# Patient Record
Sex: Female | Born: 1950 | Race: Black or African American | Hispanic: No | Smoking: Never smoker
Health system: Southern US, Community
[De-identification: ages and names within clinical notes are randomized; demographics above are authoritative.]

## PROBLEM LIST (undated history)

## (undated) DIAGNOSIS — G629 Polyneuropathy, unspecified: Secondary | ICD-10-CM

## (undated) DIAGNOSIS — I10 Essential (primary) hypertension: Secondary | ICD-10-CM

## (undated) DIAGNOSIS — E119 Type 2 diabetes mellitus without complications: Secondary | ICD-10-CM

---

## 2016-05-04 ENCOUNTER — Emergency Department (HOSPITAL_BASED_OUTPATIENT_CLINIC_OR_DEPARTMENT_OTHER): Payer: No Typology Code available for payment source

## 2016-05-04 ENCOUNTER — Emergency Department (HOSPITAL_BASED_OUTPATIENT_CLINIC_OR_DEPARTMENT_OTHER)
Admission: EM | Admit: 2016-05-04 | Discharge: 2016-05-04 | Disposition: A | Discharge status: Home / Self Care | Payer: No Typology Code available for payment source | Attending: Emergency Medicine | Admitting: Emergency Medicine

## 2016-05-04 ENCOUNTER — Encounter (HOSPITAL_BASED_OUTPATIENT_CLINIC_OR_DEPARTMENT_OTHER): Payer: Self-pay

## 2016-05-04 DIAGNOSIS — I10 Essential (primary) hypertension: Secondary | ICD-10-CM | POA: Insufficient documentation

## 2016-05-04 DIAGNOSIS — E119 Type 2 diabetes mellitus without complications: Secondary | ICD-10-CM | POA: Diagnosis not present

## 2016-05-04 DIAGNOSIS — R350 Frequency of micturition: Secondary | ICD-10-CM | POA: Diagnosis not present

## 2016-05-04 DIAGNOSIS — E1165 Type 2 diabetes mellitus with hyperglycemia: Secondary | ICD-10-CM | POA: Diagnosis not present

## 2016-05-04 DIAGNOSIS — R109 Unspecified abdominal pain: Secondary | ICD-10-CM | POA: Diagnosis present

## 2016-05-04 DIAGNOSIS — R197 Diarrhea, unspecified: Secondary | ICD-10-CM | POA: Insufficient documentation

## 2016-05-04 DIAGNOSIS — Z79899 Other long term (current) drug therapy: Secondary | ICD-10-CM | POA: Diagnosis not present

## 2016-05-04 DIAGNOSIS — R739 Hyperglycemia, unspecified: Secondary | ICD-10-CM

## 2016-05-04 HISTORY — DX: Essential (primary) hypertension: I10

## 2016-05-04 HISTORY — DX: Polyneuropathy, unspecified: G62.9

## 2016-05-04 HISTORY — DX: Type 2 diabetes mellitus without complications: E11.9

## 2016-05-04 LAB — BASIC METABOLIC PANEL
Anion gap: 8 (ref 5–15)
BUN: 19 mg/dL (ref 6–20)
CHLORIDE: 101 mmol/L (ref 101–111)
CO2: 27 mmol/L (ref 22–32)
Calcium: 9.4 mg/dL (ref 8.9–10.3)
Creatinine, Ser: 0.8 mg/dL (ref 0.44–1.00)
GFR calc Af Amer: >60 mL/min (ref >60)
GFR calc non Af Amer: >60 mL/min (ref >60)
GLUCOSE: 264 mg/dL — AB (ref 65–99)
POTASSIUM: 3.8 mmol/L (ref 3.5–5.1)
Sodium: 136 mmol/L (ref 135–145)

## 2016-05-04 LAB — CBC WITH DIFFERENTIAL/PLATELET
Basophils Absolute: 0 10*3/uL (ref 0.0–0.1)
Basophils Relative: 0 %
Eosinophils Absolute: 0.2 10*3/uL (ref 0.0–0.7)
Eosinophils Relative: 3 %
HCT: 35.7 % — ABNORMAL LOW (ref 36.0–46.0)
Hemoglobin: 12.4 g/dL (ref 12.0–15.0)
LYMPHS ABS: 1.8 10*3/uL (ref 0.7–4.0)
LYMPHS PCT: 36 %
MCH: 30.5 pg (ref 26.0–34.0)
MCHC: 34.7 g/dL (ref 30.0–36.0)
MCV: 87.7 fL (ref 78.0–100.0)
MONO ABS: 0.4 10*3/uL (ref 0.1–1.0)
MONOS PCT: 9 %
Neutro Abs: 2.5 10*3/uL (ref 1.7–7.7)
Neutrophils Relative %: 52 %
PLATELETS: 218 10*3/uL (ref 150–400)
RBC: 4.07 MIL/uL (ref 3.87–5.11)
RDW: 12.6 % (ref 11.5–15.5)
WBC: 4.9 10*3/uL (ref 4.0–10.5)

## 2016-05-04 LAB — URINALYSIS, ROUTINE W REFLEX MICROSCOPIC
BILIRUBIN URINE: NEGATIVE
GLUCOSE, UA: NEGATIVE mg/dL
Hgb urine dipstick: NEGATIVE
Ketones, ur: NEGATIVE mg/dL
Leukocytes, UA: NEGATIVE
Nitrite: NEGATIVE
PROTEIN: NEGATIVE mg/dL
Specific Gravity, Urine: 1.003 — ABNORMAL LOW (ref 1.005–1.030)
pH: 7 (ref 5.0–8.0)

## 2016-05-04 MED ORDER — METOPROLOL TARTRATE 50 MG PO TABS
25.0000 mg | ORAL_TABLET | Freq: Once | ORAL | Status: AC
  Administered 2016-05-04: 25 mg via ORAL
  Filled 2016-05-04: qty 1

## 2016-05-04 MED ORDER — KETOROLAC TROMETHAMINE 30 MG/ML IJ SOLN
15.0000 mg | Freq: Once | INTRAMUSCULAR | Status: AC
  Administered 2016-05-04: 15 mg via INTRAVENOUS
  Filled 2016-05-04: qty 1

## 2016-05-04 MED ORDER — LISINOPRIL 10 MG PO TABS
10.0000 mg | ORAL_TABLET | Freq: Once | ORAL | Status: AC
Start: 2016-05-04 — End: 2016-05-04
  Administered 2016-05-04: 10 mg via ORAL
  Filled 2016-05-04: qty 1

## 2016-05-04 MED ORDER — SODIUM CHLORIDE 0.9 % IV BOLUS (SEPSIS)
1000.0000 mL | Freq: Once | INTRAVENOUS | Status: AC
  Administered 2016-05-04: 1000 mL via INTRAVENOUS

## 2016-05-04 MED ORDER — LISINOPRIL 10 MG PO TABS: 10.0000 mg | ORAL_TABLET | Freq: Every day | ORAL | 0 refills | Status: DC

## 2016-05-04 MED ORDER — METHOCARBAMOL 500 MG PO TABS
750.0000 mg | ORAL_TABLET | Freq: Once | ORAL | Status: AC
  Administered 2016-05-04: 750 mg via ORAL
  Filled 2016-05-04: qty 2

## 2016-05-04 MED ORDER — METHOCARBAMOL 500 MG PO TABS
500.0000 mg | ORAL_TABLET | Freq: Two times a day (BID) | ORAL | 0 refills | Status: DC
Start: 2016-05-04 — End: 2016-05-25

## 2016-05-04 MED ORDER — MORPHINE SULFATE (PF) 4 MG/ML IV SOLN
4.0000 mg | Freq: Once | INTRAVENOUS | Status: AC
  Administered 2016-05-04: 4 mg via INTRAVENOUS
  Filled 2016-05-04: qty 1

## 2016-05-04 MED ORDER — ONDANSETRON HCL 4 MG/2ML IJ SOLN
4.0000 mg | Freq: Once | INTRAMUSCULAR | Status: AC
  Administered 2016-05-04: 4 mg via INTRAVENOUS
  Filled 2016-05-04: qty 2

## 2016-05-04 MED ORDER — IOPAMIDOL (ISOVUE-300) INJECTION 61%
100.0000 mL | Freq: Once | INTRAVENOUS | Status: AC | PRN
  Administered 2016-05-04: 100 mL via INTRAVENOUS

## 2016-05-04 NOTE — ED Notes (Signed)
Will provide another urine sample when she can .

## 2016-05-04 NOTE — ED Provider Notes (Signed)
[facility] Note   CSN: [id number] Arrival date & time: 05/04/16  1354     History   Chief Complaint Chief Complaint  Patient presents with  . Diarrhea    HPI Paige Stewart is a 66 y.o. female.  HPI   Pt with hx HTN, DM p/w left flank pain that began 1 week ago, gradually worsening.  Pain is exacerbated by movement and diarrhea.  Has been having diarrhea 5 times daily for the past 3 days, nonbloody.  Also having urinary frequency, nausea.  Daughter also notes that for the past week she wobbles around while she is walking.  Denies fevers, dysuria, vaginal complaints.  Denies CP, SOB.    Past Medical History:  Diagnosis Date  . Diabetes mellitus without complication (HCC)   . Hypertension   . Neuropathy (HCC)     There are no active problems to display for this patient.   Past Surgical History:  Procedure Laterality Date  . CESAREAN SECTION      OB History    No data available       Home Medications    Prior to Admission medications   Medication Sig Start Date End Date Taking? Authorizing Provider  gabapentin (NEURONTIN) 300 MG capsule Take 300 mg by mouth 2 (two) times daily.   Yes Historical Provider, MD  metFORMIN (GLUCOPHAGE) 1000 MG tablet Take 1,000 mg by mouth 2 (two) times daily with a meal.   Yes Historical Provider, MD  metoprolol tartrate (LOPRESSOR) 25 MG tablet Take 25 mg by mouth 2 (two) times daily.   Yes Historical Provider, MD    Family History No family history on file.  Social History Social History  Substance Use Topics  . Smoking status: Never Smoker  . Smokeless tobacco: Never Used  . Alcohol use No     Allergies   Patient has no known allergies.   Review of Systems Review of Systems  All other systems reviewed and are negative.    Physical Exam Updated Vital Signs BP (!) 183/93   Pulse 82   Temp 98 F (36.7 C) (Oral)   Resp 20   Ht 4\' 11"  (1.499 m)   Wt 67.1 kg   SpO2 96%   BMI 29.89 kg/m    Physical Exam  Constitutional: She appears well-developed and well-nourished. No distress.  HENT:  Head: Normocephalic and atraumatic.  Neck: Neck supple.  Cardiovascular: Normal rate and regular rhythm.   Pulmonary/Chest: Effort normal and breath sounds normal. No respiratory distress. She has no wheezes. She has no rales.  Abdominal: Soft. She exhibits no distension. There is no tenderness. There is CVA tenderness (left ). There is no rebound and no guarding.  Left thoracic back/left lateral and posterior flank tenderness.  No skin changes.    Neurological: She is alert. She has normal strength. No cranial nerve deficit or sensory deficit. She exhibits normal muscle tone. GCS eye subscore is 4. GCS verbal subscore is 5. GCS motor subscore is 6.  Skin: She is not diaphoretic.  Nursing note and vitals reviewed.    ED Treatments / Results  Labs (all labs ordered are listed, but only abnormal results are displayed) Labs Reviewed  BASIC METABOLIC PANEL - Abnormal; Notable for the following:       Result Value   Glucose, Bld 264 (*)    All other components within normal limits  CBC WITH DIFFERENTIAL/PLATELET - Abnormal; Notable for the following:    HCT 35.7 (*)  All other components within normal limits  URINALYSIS, ROUTINE W REFLEX MICROSCOPIC - Abnormal; Notable for the following:    Specific Gravity, Urine 1.003 (*)    All other components within normal limits    EKG  EKG Interpretation  Date/Time:  Thursday May 04 2016 14:39:05 EDT Ventricular Rate:  81 PR Interval:    QRS Duration: 80 QT Interval:  354 QTC Calculation: 411 R Axis:   3 Text Interpretation:  Sinus rhythm Left ventricular hypertrophy Nonspecific T abnormalities, lateral leads Baseline wander in lead(s) I III aVL V1 No old tracing to compare Confirmed by BELFI  MD, MELANIE ([id number]) on 05/04/2016 2:49:12 PM       Radiology No results found.  Procedures Procedures (including critical care  time)  Medications Ordered in ED Medications  iopamidol (ISOVUE-300) 61 % injection 100 mL (not administered)  sodium chloride 0.9 % bolus 1,000 mL (0 mLs Intravenous Stopped 05/04/16 1557)  ondansetron (ZOFRAN) injection 4 mg (4 mg Intravenous Given 05/04/16 1456)  ketorolac (TORADOL) 30 MG/ML injection 15 mg (15 mg Intravenous Given 05/04/16 1457)  metoprolol (LOPRESSOR) tablet 25 mg (25 mg Oral Given 05/04/16 1642)  morphine 4 MG/ML injection 4 mg (4 mg Intravenous Given 05/04/16 1645)     Initial Impression / Assessment and Plan / ED Course  I have reviewed the triage vital signs and the nursing notes.  Pertinent labs & imaging results that were available during my care of the patient were reviewed by me and considered in my medical decision making (see chart for details).     Afebrile nontoxic patient with left flank pain x 1 week, worse with movement, but also now with diarrhea x 3 days and urinary frequency.  Labs significant for hyperglycemia only.  UA does not appear infected.  CT abd/pelvis pending at change of shift.  r/o diverticulitis.  Pt also hypertensive, has only been taking her metoprolol once daily, home dose given.  Pain also treated with improvement of blood pressure.  Pt signed out to Paige Piliyler Mohr, PA-C, at change of shift pending CT, reassessment, and disposition.   Final Clinical Impressions(s) / ED Diagnoses   Final diagnoses:  Left flank pain  Diarrhea, unspecified type  Urinary frequency  Hyperglycemia    New Prescriptions New Prescriptions   No medications on file     MorrisonvilleEmily Avarey Yaeger, PA-C 05/04/16 1657    Rolan BuccoMelanie Belfi, MD 05/05/16 1512

## 2016-05-04 NOTE — ED Triage Notes (Signed)
Daughter reports pt with n/d, abd pain x 3 days-weakness, unsteady gait x 1 week-[place] from [place] Nov [place]-pt speaks minimal english-NAD

## 2016-05-04 NOTE — Discharge Instructions (Signed)
Read the information below.  You may return to the Emergency Department at any time for worsening condition or any new symptoms that concern you.  If you develop high fevers, worsening abdominal pain, uncontrolled vomiting, or are unable to tolerate fluids by mouth, return to the ER for a recheck.   °

## 2016-05-04 NOTE — ED Provider Notes (Signed)
  Physical Exam  BP (!) 201/102 (BP Location: Right Arm) Comment: RN Windell Mouldinguth informed of both BPs  Pulse 85   Temp 98 F (36.7 C) (Oral)   Resp 20   Ht 4\' 11"  (1.499 m)   Wt 67.1 kg   SpO2 96%   BMI 29.89 kg/m   Physical Exam  ED Course  Procedures  MDM 4:24 PM- Sign out from [facility], PA-C  Per previous provider MDM:  Afebrile nontoxic patient with left flank pain x 1 week, worse with movement, but also now with diarrhea x 3 days and urinary frequency.  Labs significant for hyperglycemia only.  UA does not appear infected.  CT abd/pelvis pending at change of shift.  r/o diverticulitis.  Pt also hypertensive, has only been taking her metoprolol once daily, home dose given.  Pain also treated with improvement of blood pressure.  Pt signed out to Audry Piliyler Judye Lorino, PA-C, at change of shift pending CT, reassessment, and disposition.  6:43 PM CT Abdomen/pelvis IMPRESSION: No acute intra-abdominal or intrapelvic abnormalities.  Small umbilical hernia containing fat.  Aortic atherosclerosis and coronary retro calcifications.  Small pericardial effusion.  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   Imaging unremarkable. Labs unremarkable. Pt symptoms likely viral in etiology vs possible muscle spasm in flank. Will Rx robaxin. At time of discharge, Patient is in no acute distress. Vital Signs are stable. Patient is able to ambulate. Patient able to tolerate PO.      Audry Piliyler Johnavon Mcclafferty, PA-C 05/04/16 1844    Laurence Spatesachel Morgan Little, MD 05/05/16 [phone]

## 2016-05-04 NOTE — ED Notes (Signed)
Patient given graham cracker and soda.  

## 2016-05-21 ENCOUNTER — Inpatient Hospital Stay (HOSPITAL_COMMUNITY)
Admission: EM | Admit: 2016-05-21 | Discharge: 2016-05-25 | DRG: 065 | Disposition: A | Discharge status: Rehabilitation Facility | Payer: No Typology Code available for payment source | Attending: Family Medicine | Admitting: Family Medicine

## 2016-05-21 ENCOUNTER — Emergency Department (HOSPITAL_COMMUNITY): Payer: No Typology Code available for payment source

## 2016-05-21 ENCOUNTER — Encounter (HOSPITAL_COMMUNITY): Payer: Self-pay | Admitting: Emergency Medicine

## 2016-05-21 ENCOUNTER — Inpatient Hospital Stay (HOSPITAL_COMMUNITY): Payer: No Typology Code available for payment source

## 2016-05-21 DIAGNOSIS — R531 Weakness: Secondary | ICD-10-CM

## 2016-05-21 DIAGNOSIS — Z7984 Long term (current) use of oral hypoglycemic drugs: Secondary | ICD-10-CM

## 2016-05-21 DIAGNOSIS — I248 Other forms of acute ischemic heart disease: Secondary | ICD-10-CM | POA: Diagnosis present

## 2016-05-21 DIAGNOSIS — Z79899 Other long term (current) drug therapy: Secondary | ICD-10-CM | POA: Diagnosis not present

## 2016-05-21 DIAGNOSIS — E663 Overweight: Secondary | ICD-10-CM | POA: Diagnosis present

## 2016-05-21 DIAGNOSIS — Z823 Family history of stroke: Secondary | ICD-10-CM | POA: Diagnosis not present

## 2016-05-21 DIAGNOSIS — I160 Hypertensive urgency: Secondary | ICD-10-CM | POA: Diagnosis present

## 2016-05-21 DIAGNOSIS — I6789 Other cerebrovascular disease: Secondary | ICD-10-CM | POA: Diagnosis not present

## 2016-05-21 DIAGNOSIS — K551 Chronic vascular disorders of intestine: Secondary | ICD-10-CM | POA: Diagnosis present

## 2016-05-21 DIAGNOSIS — B9620 Unspecified Escherichia coli [E. coli] as the cause of diseases classified elsewhere: Secondary | ICD-10-CM | POA: Diagnosis not present

## 2016-05-21 DIAGNOSIS — Z8673 Personal history of transient ischemic attack (TIA), and cerebral infarction without residual deficits: Secondary | ICD-10-CM

## 2016-05-21 DIAGNOSIS — E1140 Type 2 diabetes mellitus with diabetic neuropathy, unspecified: Secondary | ICD-10-CM | POA: Diagnosis present

## 2016-05-21 DIAGNOSIS — G8191 Hemiplegia, unspecified affecting right dominant side: Secondary | ICD-10-CM | POA: Diagnosis not present

## 2016-05-21 DIAGNOSIS — I313 Pericardial effusion (noninflammatory): Secondary | ICD-10-CM | POA: Diagnosis present

## 2016-05-21 DIAGNOSIS — R29810 Facial weakness: Secondary | ICD-10-CM | POA: Diagnosis present

## 2016-05-21 DIAGNOSIS — I10 Essential (primary) hypertension: Secondary | ICD-10-CM

## 2016-05-21 DIAGNOSIS — G8101 Flaccid hemiplegia affecting right dominant side: Secondary | ICD-10-CM | POA: Diagnosis present

## 2016-05-21 DIAGNOSIS — E876 Hypokalemia: Secondary | ICD-10-CM | POA: Diagnosis not present

## 2016-05-21 DIAGNOSIS — R4701 Aphasia: Secondary | ICD-10-CM | POA: Diagnosis present

## 2016-05-21 DIAGNOSIS — N390 Urinary tract infection, site not specified: Secondary | ICD-10-CM

## 2016-05-21 DIAGNOSIS — R609 Edema, unspecified: Secondary | ICD-10-CM | POA: Diagnosis not present

## 2016-05-21 DIAGNOSIS — R9431 Abnormal electrocardiogram [ECG] [EKG]: Secondary | ICD-10-CM

## 2016-05-21 DIAGNOSIS — R7989 Other specified abnormal findings of blood chemistry: Secondary | ICD-10-CM | POA: Diagnosis not present

## 2016-05-21 DIAGNOSIS — Z6829 Body mass index (BMI) 29.0-29.9, adult: Secondary | ICD-10-CM

## 2016-05-21 DIAGNOSIS — E119 Type 2 diabetes mellitus without complications: Secondary | ICD-10-CM | POA: Diagnosis not present

## 2016-05-21 DIAGNOSIS — Z9114 Patient's other noncompliance with medication regimen: Secondary | ICD-10-CM

## 2016-05-21 DIAGNOSIS — I639 Cerebral infarction, unspecified: Secondary | ICD-10-CM | POA: Diagnosis present

## 2016-05-21 DIAGNOSIS — N12 Tubulo-interstitial nephritis, not specified as acute or chronic: Secondary | ICD-10-CM | POA: Diagnosis not present

## 2016-05-21 DIAGNOSIS — I2510 Atherosclerotic heart disease of native coronary artery without angina pectoris: Secondary | ICD-10-CM | POA: Diagnosis present

## 2016-05-21 DIAGNOSIS — I701 Atherosclerosis of renal artery: Secondary | ICD-10-CM | POA: Diagnosis present

## 2016-05-21 DIAGNOSIS — E785 Hyperlipidemia, unspecified: Secondary | ICD-10-CM | POA: Diagnosis present

## 2016-05-21 DIAGNOSIS — R471 Dysarthria and anarthria: Secondary | ICD-10-CM | POA: Diagnosis present

## 2016-05-21 DIAGNOSIS — I2489 Other forms of acute ischemic heart disease: Secondary | ICD-10-CM

## 2016-05-21 DIAGNOSIS — I214 Non-ST elevation (NSTEMI) myocardial infarction: Secondary | ICD-10-CM | POA: Diagnosis not present

## 2016-05-21 DIAGNOSIS — I63412 Cerebral infarction due to embolism of left middle cerebral artery: Secondary | ICD-10-CM | POA: Diagnosis present

## 2016-05-21 DIAGNOSIS — I638 Other cerebral infarction: Secondary | ICD-10-CM | POA: Diagnosis not present

## 2016-05-21 DIAGNOSIS — G8100 Flaccid hemiplegia affecting unspecified side: Secondary | ICD-10-CM | POA: Diagnosis not present

## 2016-05-21 DIAGNOSIS — E1142 Type 2 diabetes mellitus with diabetic polyneuropathy: Secondary | ICD-10-CM | POA: Diagnosis not present

## 2016-05-21 DIAGNOSIS — E1165 Type 2 diabetes mellitus with hyperglycemia: Secondary | ICD-10-CM | POA: Diagnosis present

## 2016-05-21 DIAGNOSIS — R1310 Dysphagia, unspecified: Secondary | ICD-10-CM | POA: Diagnosis present

## 2016-05-21 LAB — COMPREHENSIVE METABOLIC PANEL
ALT: 17 U/L (ref 14–54)
AST: 21 U/L (ref 15–41)
Albumin: 3.9 g/dL (ref 3.5–5.0)
Alkaline Phosphatase: 51 U/L (ref 38–126)
Anion gap: 9 (ref 5–15)
BILIRUBIN TOTAL: 0.4 mg/dL (ref 0.3–1.2)
BUN: 14 mg/dL (ref 6–20)
CO2: 27 mmol/L (ref 22–32)
Calcium: 9.3 mg/dL (ref 8.9–10.3)
Chloride: 101 mmol/L (ref 101–111)
Creatinine, Ser: 0.76 mg/dL (ref 0.44–1.00)
GFR calc Af Amer: >60 mL/min (ref >60)
Glucose, Bld: 254 mg/dL — ABNORMAL HIGH (ref 65–99)
Potassium: 3.8 mmol/L (ref 3.5–5.1)
Sodium: 137 mmol/L (ref 135–145)
TOTAL PROTEIN: 7.2 g/dL (ref 6.5–8.1)

## 2016-05-21 LAB — CBC
HCT: 37.3 % (ref 36.0–46.0)
HEMOGLOBIN: 12.6 g/dL (ref 12.0–15.0)
MCH: 29.4 pg (ref 26.0–34.0)
MCHC: 33.8 g/dL (ref 30.0–36.0)
MCV: 86.9 fL (ref 78.0–100.0)
Platelets: 233 10*3/uL (ref 150–400)
RBC: 4.29 MIL/uL (ref 3.87–5.11)
RDW: 13.2 % (ref 11.5–15.5)
WBC: 5.1 10*3/uL (ref 4.0–10.5)

## 2016-05-21 LAB — DIFFERENTIAL
BASOS ABS: 0 10*3/uL (ref 0.0–0.1)
Basophils Relative: 0 %
EOS ABS: 0.1 10*3/uL (ref 0.0–0.7)
Eosinophils Relative: 2 %
LYMPHS ABS: 1.2 10*3/uL (ref 0.7–4.0)
Lymphocytes Relative: 23 %
MONO ABS: 0.3 10*3/uL (ref 0.1–1.0)
MONOS PCT: 5 %
Neutro Abs: 3.6 10*3/uL (ref 1.7–7.7)
Neutrophils Relative %: 70 %

## 2016-05-21 LAB — PROTIME-INR
INR: 1.06
Prothrombin Time: 13.8 seconds (ref 11.4–15.2)

## 2016-05-21 LAB — I-STAT CHEM 8, ED
BUN: 17 mg/dL (ref 6–20)
CALCIUM ION: 1.18 mmol/L (ref 1.15–1.40)
CREATININE: 0.7 mg/dL (ref 0.44–1.00)
Chloride: 101 mmol/L (ref 101–111)
GLUCOSE: 256 mg/dL — AB (ref 65–99)
HCT: 39 % (ref 36.0–46.0)
HEMOGLOBIN: 13.3 g/dL (ref 12.0–15.0)
POTASSIUM: 3.8 mmol/L (ref 3.5–5.1)
Sodium: 138 mmol/L (ref 135–145)
TCO2: 31 mmol/L (ref 0–100)

## 2016-05-21 LAB — TROPONIN I
TROPONIN I: 3.4 ng/mL — AB (ref <0.03)
TROPONIN I: 3.5 ng/mL — AB (ref <0.03)

## 2016-05-21 LAB — APTT: APTT: 30 s (ref 24–36)

## 2016-05-21 LAB — GLUCOSE, CAPILLARY: Glucose-Capillary: 186 mg/dL — ABNORMAL HIGH (ref 65–99)

## 2016-05-21 LAB — ETHANOL: Alcohol, Ethyl (B): <5 mg/dL (ref <5)

## 2016-05-21 LAB — I-STAT TROPONIN, ED: TROPONIN I, POC: 1.91 ng/mL — AB (ref 0.00–0.08)

## 2016-05-21 MED ORDER — ACETAMINOPHEN 650 MG RE SUPP
650.0000 mg | RECTAL | Status: DC | PRN
  Filled 2016-05-21: qty 1

## 2016-05-21 MED ORDER — STROKE: EARLY STAGES OF RECOVERY BOOK
Freq: Once | Status: AC
  Administered 2016-05-21: 13:00:00
  Filled 2016-05-21: qty 1

## 2016-05-21 MED ORDER — METOPROLOL TARTRATE 5 MG/5ML IV SOLN
2.5000 mg | Freq: Once | INTRAVENOUS | Status: AC
  Administered 2016-05-21: 2.5 mg via INTRAVENOUS
  Filled 2016-05-21: qty 5

## 2016-05-21 MED ORDER — SENNOSIDES-DOCUSATE SODIUM 8.6-50 MG PO TABS
1.0000 | ORAL_TABLET | Freq: Every evening | ORAL | Status: DC | PRN
  Administered 2016-05-24: 1 via ORAL
  Filled 2016-05-21: qty 1

## 2016-05-21 MED ORDER — IOPAMIDOL (ISOVUE-370) INJECTION 76%: 75.0000 mL | Freq: Once | INTRAVENOUS | Status: DC | PRN

## 2016-05-21 MED ORDER — SODIUM CHLORIDE 0.9 % IV SOLN
INTRAVENOUS | Status: DC
  Administered 2016-05-21 – 2016-05-23 (×5): via INTRAVENOUS
  Administered 2016-05-24: 1000 mL via INTRAVENOUS

## 2016-05-21 MED ORDER — INSULIN ASPART 100 UNIT/ML ~~LOC~~ SOLN
0.0000 [IU] | SUBCUTANEOUS | Status: DC
  Administered 2016-05-21 – 2016-05-22 (×2): 2 [IU] via SUBCUTANEOUS
  Administered 2016-05-22: 3 [IU] via SUBCUTANEOUS
  Administered 2016-05-22: 2 [IU] via SUBCUTANEOUS
  Administered 2016-05-22 – 2016-05-23 (×2): 3 [IU] via SUBCUTANEOUS
  Administered 2016-05-23: 2 [IU] via SUBCUTANEOUS
  Administered 2016-05-23: 3 [IU] via SUBCUTANEOUS

## 2016-05-21 MED ORDER — IOPAMIDOL (ISOVUE-370) INJECTION 76%
INTRAVENOUS | Status: AC
  Administered 2016-05-21: 11:00:00
  Filled 2016-05-21: qty 200

## 2016-05-21 MED ORDER — ASPIRIN 300 MG RE SUPP
300.0000 mg | Freq: Once | RECTAL | Status: AC
  Administered 2016-05-21: 300 mg via RECTAL
  Filled 2016-05-21: qty 1

## 2016-05-21 MED ORDER — METOPROLOL TARTRATE 12.5 MG HALF TABLET: 12.5000 mg | ORAL_TABLET | Freq: Two times a day (BID) | ORAL | Status: DC

## 2016-05-21 MED ORDER — ACETAMINOPHEN 160 MG/5ML PO SOLN: 650.0000 mg | ORAL | Status: DC | PRN

## 2016-05-21 MED ORDER — ACETAMINOPHEN 325 MG PO TABS
650.0000 mg | ORAL_TABLET | ORAL | Status: DC | PRN
  Administered 2016-05-23: 650 mg via ORAL
  Filled 2016-05-21: qty 2

## 2016-05-21 NOTE — Progress Notes (Signed)
Patient was unable to cough on command during a swallow eval. SLP ordered per protocol.

## 2016-05-21 NOTE — ED Notes (Signed)
Notified by lab that troponin is 1.9.  MD notified.

## 2016-05-21 NOTE — ED Notes (Signed)
Patient transported to CT 

## 2016-05-21 NOTE — Progress Notes (Signed)
Patient arrived to floor. A&O X4. Family at bedside. MD at patient's bedside. Will continue to monitor

## 2016-05-21 NOTE — ED Notes (Signed)
Pt needing to lay flat at this time.  Swallow screen on hold until patient able to sit up.

## 2016-05-21 NOTE — Consult Note (Signed)
Neurology Consultation Reason for Consult: Right sided weakness Referring Physician: Corlis Leak, C  CC: Right sided weakness  History is obtained from:patient  HPI: Paige Stewart is a 66 y.o. female was last known to be in her normal state of health last night prior to going to bed. This morning around 8:30 AM, she was found down by her family and brought into the emergency department. No code stroke was called because she was outside the 8 hour window, but on arrival it was recognized that she had exam findings concerning for large vessel occlusion and therefore she was taken for a stat CT perfusion. This did show an area of penumbra, however she does not have any clear lesion on CTA that would be amenable to intervention.   LKW: 3/31 prior to bed tpa given?: no, outside of window    ROS:  Unable to obtain due to altered mental status.   Past Medical History:  Diagnosis Date  . Diabetes mellitus without complication (HCC)   . Hypertension   . Neuropathy (HCC)      Family history: Unable to obtain due to aphasia   Social History:  reports that she has never smoked. She has never used smokeless tobacco. She reports that she does not drink alcohol or use drugs.   Exam: Current vital signs: BP (!) 195/118 (BP Location: Right Arm)   Pulse (!) 112   Resp (!) 21   Wt 67.1 kg (148 lb)   SpO2 96%   BMI 29.89 kg/m  Vital signs in last 24 hours: Pulse Rate:  [92-112] 112 (04/01 1022) Resp:  [18-21] 21 (04/01 1022) BP: (193-195)/(112-118) 195/118 (04/01 1022) SpO2:  [94 %-98 %] 96 % (04/01 1022) Weight:  [67.1 kg (148 lb)] 67.1 kg (148 lb) (04/01 0951)   Physical Exam  Constitutional: Appears well-developed and well-nourished.  Psych: Affect appropriate to situation Eyes: No scleral injection HENT: No OP obstrucion Head: Normocephalic.  Cardiovascular: Normal rate and regular rhythm.  Respiratory: Effort normal and breath sounds normal to anterior ascultation GI: Soft.   No distension. There is no tenderness.  Skin: WDI  Neuro: Mental Status: Patient is awake, alert, she is able to follow simple commands, but does not have any verbal output. Cranial Nerves: II: able to count in all 4 fields Pupils are equal, round, and reactive to light.   III,IV, VI: EOMI without ptosis or diploplia.  V: Facial sensation is decreased on the right VII: Facial movement with decreased movement on the right VIII: hearing is intact to voice X: Uvula elevates symmetrically XI: Shoulder shrug is symmetric. XII: tongue is midline without atrophy or fasciculations.  Motor: She has a significant right hemiparesis, 1/5 Sensory: Decreased throughout the right side Cerebellar: No clear ataxia  I have reviewed labs in epic and the results pertinent to this consultation are: CMP-unremarkable other than elevated glucose  I have reviewed the images obtained: CT head-no hemorrhage, CT perfusion-small area of penumbra in the anterior MCA distraction on the left, CT angiogram-no clear lesion amenable to intervention  Impression: 66 year old female with a wake up stroke. Unfortunately she is not a candidate for intra-arterial intervention or IV TPA. She will need to be admitted for secondary stroke prevention measures and physical therapy.  She also has an elevated troponin, unclear if this is related to the stroke or cardiac issues. Though I suspect her stroke will not be massive, there may be some risk of hemorrhagic conversion, if there is concern about progression of her neck  disease then though there would need to be a risk versus benefit decision, IV heparin could be considered. I would be okay with dual antiplatelet therapy if indicated from a cardiac perspective.  Recommendations: 1. HgbA1c, fasting lipid panel 2. MRI of the brain without contrast 3. Frequent neuro checks 4. Echocardiogram 5. Carotid dopplers are not needed given that she got a CT angiogram 6. Prophylactic  therapy-Antiplatelet med: Aspirin - dose  PO or  PR 7. Risk factor modification 8. Telemetry monitoring 9. PT consult, OT consult, Speech consult 10. please page stroke NP  Or  PA  Or MD  from 8am -4 pm  as this patient will be followed by the stroke team at this point.   You can look them up on [email or web address]      Ritta Slot, MD [facility] [phone]  If 7pm- 7am, please page neurology on call as listed in AMION.

## 2016-05-21 NOTE — H&P (Signed)
Family Medicine Teaching [facility] Admission History and Physical Service Pager: [phone]  Patient name: Paige Stewart Medical record number: [id number] Date of birth: [birth year] Age: 66 y.o. Gender: female  Primary Care Provider: No PCP Per Patient Consultants: Cardiology, Neurology  Code Status: Partial. Patient does not want to be intubated.   Chief Complaint: right sided weakness, right facial droop, and dysarthria.   Assessment and Plan: Meliss Fleek is a 65 y.o. female presenting with right sided weakness, right facial droop, dysarthria. PMH is significant for DM2 ,HTN, diabetic neuropathy.   Right Sided Weakness, right facial droop, dysarthria: CT head without hemorrhage, CT perfusion with small area of penumbra in the anterior MCA per neurology. CTA chest, abd, pelvis without dissection. Risk factors: HTN, DM2 (uncertain how controlled this is). - admit to telemetry, attending Dr. Jennette Kettle - neurology consulted and following, appreciate recommendations: ASA  PO or  PR - ASA  PR  - neuro checks and vitals per protocol - risk stratification labs: A1c and Lipid panel - MRI brain - PT/OT/Speech consult   Elevated Troponin: istat troponin 1.91. No chest pain or shortness of breath. EKG with T wave inversion in 1, aVL and V2 with mild ST elevation in V2. No prior history of MI. CT chest/abd/pelvis noted three vessel coronary atherosclerosis.  Risk factors: HTN, DM2 (uncertain how controlled this is). - cardiology consulted, appreciate recommendations: could be seen in the setting of acute stroke as patient had not preceding anginal symptoms. EKG T wave changes could be seen also in stroke. No IV heparin dur to risk of hemorrhagic transformation, ASA, continue to cycle troponin, no invasive work up at this time, Statin with PO okay, Metoprolol if tolerated  - ASA (rectal today due to NPO) - ECHO - currently holding Statin and Metoprolol due to NPO  - trending  troponin  HTN: Elevated likely in the setting of stroke. However she did have a ED visit in March 2018 (for flank pain and diarrhea) where her BPs were elevated to 180s-190s/90s-100s; unclear what baseline BP is. Home medications: lisinopril and metoprolol.  - vitals per protocol - permissive HTN for the first 24 hours  - holding home BP meds for now  - may add PRN Hydralazine, will monitor first   DM2 with diabetic neuropathy: No a1c in chart. On Metformin at home. Patient not taking Gabapentin at home.  - holding metformin - A1c - CBGs q 4 with sensitive SSI until pt can have a diet  Small to Moderate Pericardial Effusion: noted on CT mildly increased since 04/2016.  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    FEN/GI: NPO until swallow study  Prophylaxis: SCDs for now. Will re-consider in 24 hours   Disposition: admit to FPTS telemetry for stroke and elevated troponin work up  History of Present Illness:  Paige Stewart is a 66 y.o. female presenting with right sided weakness, right facial droop, and expressive aphasia.   Patient able to understand english and answer yes or no questions. Daughter helped provide history.  Patient's last normal was around 9:30PM 3/31. This morning around 8:15AM daughter found patient on the floor. Per daughter the mother reported that she was walking back to the bed from her bathroom when she felt weak. She slid down to the floor and did not have loss of consciousness. She denies having chest pain or palpitations at that time. Reports she "slid"  down on her right side but notes of some discomfort in her left thigh. Daughter also reports that her brother reported that patient was texting him around 4:58 AM this morning.   No history of smoking or alcohol use. She denies abdominal pain, dysuria, increased frequency, reports normal bowel movements.  Daughter reports of intermittent dizziness "for years"; patient denies feeling dizzy today. Daughter also reports of generalized weakness "for years". No blurred vision, no nausea/vomiting. No fevers or chills.   Of note, patient is visit her daughter from [place]. At baseline, she lives by herself and is able to do all ADLs. Ambulates without any assistance. Reports of family history of strokes: sister at age 4 and another sister at age 10.   Patient arrived via EMS. In the ED, code stroke was called. CT head was negative. CT perfusion study small area of penumbra in the anterior MCA per neurology. CT angio chest/abd/pelvis negative for dissection, but showed mild to mod pericardial effusion mildly increased since 05/04/16. Patient was not a candidate for intra-arterial intervention or IV TPA. Labs also notable for istat troponin of 1.91. EKG with T wave inversion in 1, aVL and V2 with mild ST elevation in V2. CBC and CMP unremarkable except for glucose 254. PT/INR normal. ED also ordered UDS, UA, and EtOH.   Review Of Systems: Per HPI.  ROS  Patient Active Problem List   Diagnosis Date Noted  . Stroke [facility]) 05/21/2016    Past Medical History: Past Medical History:  Diagnosis Date  . Diabetes mellitus without complication (HCC)   . Hypertension   . Neuropathy [facility])     Past Surgical History: Past Surgical History:  Procedure Laterality Date  . CESAREAN SECTION      Social History: Social History  Substance Use Topics  . Smoking status: Never Smoker  . Smokeless tobacco: Never Used  . Alcohol use No   Additional social history: note above Please also refer to relevant sections of EMR.  Family History: Sister: stroke at age 10 Sister: stroke at age 30 No FH of MI   Allergies and Medications: No Known Allergies No current facility-administered medications on file prior to encounter.    Current Outpatient Prescriptions on File Prior to Encounter  Medication Sig Dispense  Refill  . gabapentin (NEURONTIN) 300 MG capsule Take 300 mg by mouth 2 (two) times daily.    Marland Kitchen lisinopril (PRINIVIL,ZESTRIL) 10 MG tablet Take 1 tablet (10 mg total) by mouth daily. 30 tablet 0  . metFORMIN (GLUCOPHAGE) 1000 MG tablet Take 1,000 mg by mouth 2 (two) times daily with a meal.    . metoprolol tartrate (LOPRESSOR) 25 MG tablet Take 25 mg by mouth 2 (two) times daily.    . methocarbamol (ROBAXIN) 500 MG tablet Take 1 tablet (500 mg total) by mouth 2 (two) times daily. (Patient not taking: Reported on 05/21/2016) 20 tablet 0    Objective: BP (!) 180/98   Pulse 91   Resp 17   Wt 67.1 kg (148 lb)   SpO2 100%   BMI 29.89 kg/m  Exam: GEN: NAD, resting  HEENT: Atraumatic, normocephalic, neck supple, EOMI, sclera clear, PERRL, pharynx  CV: RRR, no murmurs, rubs, or gallops PULM: CTAB, normal effort ABD: Soft, nontender, nondistended, NABS, no organomegaly SKIN: No rash or cyanosis; warm and well-perfused EXTR: No lower extremity edema or calf tenderness PSYCH: Mood and affect euthymic, normal rate and volume of speech NEURO: Awake, alert, able to answer yes or no questions but otherwise  has dysarthria. CN 2-4 intact. Reports of decreased sensation and decreased movement on the right side of face. CN 6 intact. CN 8-12 normal except right shoulder shrug is decreased. Right arm and leg weakness (1/5), sensation to light touch decreased on the right arm and leg compared to the left. Right arm and leg strength and sensation normal.    Labs and Imaging: CBC BMET   Recent Labs Lab 05/21/16 0953 05/21/16 1006  WBC 5.1  --   HGB 12.6 13.3  HCT 37.3 39.0  PLT 233  --     Recent Labs Lab 05/21/16 0953 05/21/16 1006  NA 137 138  K 3.8 3.8  CL 101 101  CO2 27  --   BUN 14 17  CREATININE 0.76 0.70  GLUCOSE 254* 256*  CALCIUM 9.3  --      CT Head/CTA Head and Neck/CT Perfusion: IMPRESSION: 23 mL volume of hypoperfusion in the left frontotemporal lobe and left frontal  operculum without fixed infarct by CT perfusion  Hypoperfusion of the anterior branch of left MCA supplying the above area of infarction. This appears to be a left M3 branch which may have a severe stenosis. Left M1 is widely patent.  No significant vertebral or carotid artery stenosis in the neck.  CTA chest/abd/pelvis:  IMPRESSION: 1. No acute aortic syndrome. 2. Small to moderate pericardial effusion, mildly increased since 05/04/2016. Consider echocardiographic correlation. 3. Trace dependent bilateral pleural effusions. No acute pulmonary disease. 4. No acute abnormality in the abdomen or pelvis. 5. Three-vessel coronary atherosclerosis. 6. 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 7. Small hiatal hernia.  Palma Holter, MD 05/21/2016, 12:23 PM PGY-2, [facility] FPTS Intern pager: [phone], text pages welcome

## 2016-05-21 NOTE — ED Notes (Signed)
Pt transported to CT with RN and Neurologist

## 2016-05-21 NOTE — Evaluation (Signed)
Clinical/Bedside Swallow Evaluation Patient Details  Name: Yazleemar Strassner MRN: [id number] Date of Birth: 1950/03/01  Today's Date: 05/21/2016 Time: SLP Start Time (ACUTE ONLY): 1645 SLP Stop Time (ACUTE ONLY): 1700 SLP Time Calculation (min) (ACUTE ONLY): 15 min  Past Medical History:  Past Medical History:  Diagnosis Date  . Diabetes mellitus without complication (HCC)   . Hypertension   . Neuropathy [facility])    Past Surgical History:  Past Surgical History:  Procedure Laterality Date  . CESAREAN SECTION     HPI:  Bernadean Saling a 66 y.o.femalepresenting with right sided weakness, right facial droop, dysarthria. PMH is significant for DM2 ,HTN, diabetic neuropathy. CT head without hemorrhage, CT perfusion with small area of penumbra in the anterior MCA per neurology. MRI 05/21/16 showed acute left basal ganglia infarct, moderate chronic small vessel ischemic disease. No prior swallowing evaluations in chart. Failed nursing stroke swallow screen.   Assessment / Plan / Recommendation Clinical Impression  Patient presents with severe risk for aspiration in setting of basal ganglia infarction. Right sided facial droop, weakness noted, reduced labial seal. Patient is alert, follows basic commands with 75% accuracy. She is unable to produce volitional cough despite cuing, requires max cues to initiate voicing which is breathy, weak. Manipulates ice chip when presented. Pt's daughter cued her to swallow ice chip whole, resulting in immediate cough and explosive expectoration, suggestive of reduced airway protection. Overt signs of aspiration not noted when pt allows ice to melt before swallowing. With initial swallows of thin liquid via cup and straw, patient does not display overt signs of aspiration. However, suspect delayed swallow initiation, discoordination. Initially tolerates pureed and regular solids with appearance of timely swallow initiation. Pt occasionally grimaces with swallow as  trials progressed. Presents with delayed coughing with thin liquid wash of regular solid. Immediate cough with all further consistencies trialed, including nectar-thick liquids, pureed solids. Recommend pt remain NPO, SLP will follow up for instrumental assessment (MBS) next date. Pt may have ice chips after oral care (allow to melt vs. swallow whole). Provided education to pt and daughter re: these recommendations. Pt has expressive language deficits, suspect some receptive as well. Will follow up for cognitive-linguistic evaluation.  SLP Visit Diagnosis: Dysphagia, unspecified (R13.10)    Aspiration Risk  Severe aspiration risk    Diet Recommendation NPO;Ice chips PRN after oral care   Medication Administration: Via alternative means    Other  Recommendations Oral Care Recommendations: Oral care QID Other Recommendations: Remove water pitcher;Have oral suction available   Follow up Recommendations Other (comment) (tbd)      Frequency and Duration min 2x/week  2 weeks       Prognosis Prognosis for Safe Diet Advancement: Good      Swallow Study   General Date of Onset: 05/21/16 HPI: Aubrianna Kopulandeis a 66 y.o.femalepresenting with right sided weakness, right facial droop, dysarthria. PMH is significant for DM2 ,HTN, diabetic neuropathy. CT head without hemorrhage, CT perfusion with small area of penumbra in the anterior MCA per neurology. MRI 05/21/16 showed acute left basal ganglia infarct, moderate chronic small vessel ischemic disease. No prior swallowing evaluations in chart. Failed nursing stroke swallow screen. Type of Study: Bedside Swallow Evaluation Previous Swallow Assessment: none in chart Diet Prior to this Study: NPO Temperature Spikes Noted: No Respiratory Status: Room air History of Recent Intubation: No Behavior/Cognition: Alert;Cooperative Oral Cavity Assessment: Within Functional Limits Oral Care Completed by SLP: No Oral Cavity - Dentition: Adequate natural  dentition Vision: Functional for self-feeding Self-Feeding Abilities:  Needs assist Patient Positioning: Upright in chair Baseline Vocal Quality: Breathy Volitional Cough: Other (Comment) (could not elicit) Volitional Swallow: Able to elicit    Oral/Motor/Sensory Function Overall Oral Motor/Sensory Function: Mild impairment Facial ROM: Reduced right;Suspected CN VII (facial) dysfunction Facial Symmetry: Abnormal symmetry right;Suspected CN VII (facial) dysfunction Facial Strength: Reduced right;Suspected CN VII (facial) dysfunction Facial Sensation: Within Functional Limits Lingual ROM: Within Functional Limits Lingual Symmetry: Within Functional Limits Lingual Strength: Within Functional Limits Lingual Sensation: Within Functional Limits Velum: Within Functional Limits Mandible: Within Functional Limits   Ice Chips Ice chips: Impaired Presentation: Spoon Pharyngeal Phase Impairments: Cough - Immediate   Thin Liquid Thin Liquid: Impaired Presentation: Cup;Straw Oral Phase Impairments: Reduced labial seal;Reduced lingual movement/coordination Oral Phase Functional Implications: Prolonged oral transit Pharyngeal  Phase Impairments: Suspected delayed Swallow;Multiple swallows;Cough - Immediate;Cough - Delayed    Nectar Thick Nectar Thick Liquid: Impaired Oral Phase Impairments: Reduced labial seal;Reduced lingual movement/coordination Oral phase functional implications: Right anterior spillage;Prolonged oral transit Pharyngeal Phase Impairments: Cough - Immediate;Cough - Delayed   Honey Thick Honey Thick Liquid: Not tested   Puree Puree: Impaired Pharyngeal Phase Impairments: Cough - Delayed   Solid   GO   Solid: Impaired Oral Phase Impairments: Impaired mastication Oral Phase Functional Implications: Impaired mastication;Prolonged oral transit;Oral residue Pharyngeal Phase Impairments: Cough - Delayed;Other (comments) (following liquid wash)       Rondel Baton, MS  CF-SLP Speech-Language Pathologist [phone] Arlana Lindau 05/21/2016,5:52 PM

## 2016-05-21 NOTE — Consult Note (Signed)
Cardiology Consultation:   Patient ID: Paige Stewart; [id number]; Paige Stewart   Admit date: 05/21/2016 Date of Consult: 05/21/2016  Reason for Consultation: Evaluation of elevated troponin in the setting of stroke and abnormal EKG Referring Provider:  Dr. Corlis Leak  Primary Care Provider: No PCP Per Patient Primary Cardiologist: None   Patient Profile:   Paige Stewart is a 66 y.o. female who woke up after usual state of health last night, found down, stroke, no hemorrhage found to have elevated troponin and abnormal EKG  History of Present Illness:   Paige Stewart 66 year old female with diabetes, hypertension who according to family was in her usual state of health last night but unfortunately was found down earlier this morning and was brought in the emergency department and found to have stroke in the anterior MCA distribution on left. She was not felt to be a TPA candidate. Nonhemorrhagic at this point. Aspirin administered.   She is unable to provide any verbal input but is able to follow simple commands. One of her family members is present and she states that she was on the phone with her brother in [place] at 5 AM this morning and was doing well. It seems like she was getting up to go to the bathroom when she fell and had her stroke. Decreased movement on the right facial symmetry noted. Right-sided weakness.  When asked if she had any chest pain, the patient is able to shake her head and respond no. She also does not have any shortness of breath currently, laying flat. Prior to this incident, she was not complaining of any chest pain at home according to family member.  Her family member does state that she has been noncompliant with her medications for hypertension and diabetes.  Blood pressure was significantly elevated on arrival at 193/112 with pulse of 92. A CT of her head and neck and CT perfusion scan was performed. There was concerned about the possibility of aortic  dissection with her hypertension and stroke.  Her point-of-care troponin was 1.91. Glucose elevated at 256. Hemoglobin 13.3.  Her ECG shows approximate 1 mm of ST elevation in V2, subtle in V3 with T-wave inversions noted in 1, aVL as well as biphasic T-wave in V2. When compared to prior EKG from 05/04/16, ST segment and accentuated T-wave changes are new.      Past Medical History:  Diagnosis Date  . Diabetes mellitus without complication (HCC)   . Hypertension   . Neuropathy [facility])     Past Surgical History:  Procedure Laterality Date  . CESAREAN SECTION       Inpatient Medications: Scheduled Meds:  Continuous Infusions:  PRN Meds: iopamidol  Allergies:   No Known Allergies  Social History:   Social History   Social History  . Marital status: Widowed    Spouse name: N/A  . Number of children: N/A  . Years of education: N/A   Social History Main Topics  . Smoking status: Never Smoker  . Smokeless tobacco: Never Used  . Alcohol use No  . Drug use: No  . Sexual activity: Not Asked   Other Topics Concern  . None   Social History Narrative  . None    Family History:   No family history on file.   ROS:  Please see the history of present illness. Unable to provide history secondary to nonverbal. Family does not report anything remarkable previously. All other ROS reviewed and negative.     Physical Exam/Data:   Vitals:  05/21/16 0945 05/21/16 0946 05/21/16 0951 05/21/16 1022  BP: (!) 193/112   (!) 195/118  Pulse: 92   (!) 112  Resp: 18   (!) 21  SpO2: 94% 98%  96%  Weight:   148 lb (67.1 kg)    No intake or output data in the 24 hours ending 05/21/16 1049 Filed Weights   05/21/16 0951  Weight: 148 lb (67.1 kg)    General:  Well nourished, well developed,Currently trouble moving the right side of her body. HEENT: normal, Towel wrapped around head Lymph: no adenopathy Neck: no JVD Endocrine:  No thryomegaly Vascular: No carotid bruits    Cardiac:  normal S1, S2; RRR; no murmur no gallops Lungs:  clear to auscultation bilaterally, no wheezing, rhonchi or rales  Abd: soft, nontender, no hepatomegaly  Ext: no edema Musculoskeletal:  No deformities Skin: warm and dry  Neuro:  Right facial weakness noted, right-sided body weakness noted Psych:  Unable to fully evaluate   EKG:  EKGs reviewed in history of present illness as described above personally viewed.  Relevant CV Studies: ECGs as above.  Laboratory Data:  Chemistry Recent Labs Lab 05/21/16 0953 05/21/16 1006  NA 137 138  K 3.8 3.8  CL 101 101  CO2 27  --   GLUCOSE 254* 256*  BUN 14 17  CREATININE 0.76 0.70  CALCIUM 9.3  --   GFRNONAA >60  --   GFRAA >60  --   ANIONGAP 9  --      Recent Labs Lab 05/21/16 0953  PROT 7.2  ALBUMIN 3.9  AST 21  ALT 17  ALKPHOS 51  BILITOT 0.4   Hematology Recent Labs Lab 05/21/16 0953 05/21/16 1006  WBC 5.1  --   RBC 4.29  --   HGB 12.6 13.3  HCT 37.3 39.0  MCV 86.9  --   MCH 29.4  --   MCHC 33.8  --   RDW 13.2  --   PLT 233  --    Cardiac EnzymesNo results for input(s): TROPONINI in the last 168 hours.  Recent Labs Lab 05/21/16 1000  TROPIPOC 1.91*    BNPNo results for input(s): BNP, PROBNP in the last 168 hours.  DDimer No results for input(s): DDIMER in the last 168 hours.  Radiology/Studies:  Ct Head Code Stroke Wo Contrast`  Result Date: 05/21/2016 CLINICAL DATA:  Code stroke.  Right-sided weakness EXAM: CT HEAD WITHOUT CONTRAST TECHNIQUE: Contiguous axial images were obtained from the base of the skull through the vertex without intravenous contrast. COMPARISON:  None. FINDINGS: Brain: Generalized atrophy. Low-density throughout the cerebral white matter bilaterally appears chronic. Negative for acute infarct. Negative for hemorrhage or mass lesion. No shift of the midline structures. Vascular: Negative for hyperdense vessel Skull: Negative Sinuses/Orbits: Mild mucosal edema left maxillary  sinus. Normal orbit. Other: None ASPECTS ([place] Stroke Program Early CT Score) - Ganglionic level infarction (caudate, lentiform nuclei, internal capsule, insula, M1-M3 cortex): 7 - Supraganglionic infarction (M4-M6 cortex): 3 Total score (0-10 with 10 being normal): 10 IMPRESSION: 1. Negative for acute infarct 2. ASPECTS is 10 3. Atrophy and moderate chronic microvascular ischemic change in the white matter I reviewed the images with Dr. Amada Jupiter at 1015 hours 05/21/2016. Electronically Signed   By: Marlan Palau M.D.   On: 05/21/2016 10:30    Assessment and Plan:   66 year old female with acute stroke, right-sided hemiparesis, aphasia, with troponin of 1.91 and abnormal EKG with T-wave inversion in 1, aVL and V2 as well  as mild ST elevation in V2.  Elevated troponin/demand ischemia  - Elevated troponin can be seen in the setting of acute stroke and she has had no preceding anginal symptoms. Also, she is able to communicate nonverbally that she is not having any chest discomfort.  - Abnormal EKG, T wave changes especially can be seen also in stroke. Unfortunately, I would not utilize IV heparin for risk of hemorrhagic transformation (see neurology note as well as) disease. This may not be true ACS/plaque rupture. I agree that with hypertension and recent stroke, checking CT angiogram to ensure that there is no evidence of dissection with propagation up neck makes sense. However, clinically, she is not demonstrating any severe chest discomfort, tearing discomfort that would go hand-in-hand with aortic dissection with propagation up her neck. She appears quite comfortable. Discussed with ER as well as Dr Amada Jupiter.  - Aspirin for now.  - Check echocardiogram.  - Continue to cycle cardiac troponin.  - No invasive workup at this time.  - Statin if tolerated from by mouth perspective.  - Metoprolol if tolerated  Acute stroke  - Per neurology, primary team  - Blood pressure per neurology  guidance  Uncontrolled hypertension  - Impart reactionary from acute stroke  - Management per primary team.   Signed, Donato Schultz, MD  05/21/2016 10:49 AM

## 2016-05-21 NOTE — ED Provider Notes (Signed)
[facility]-EMERGENCY DEPT Provider Note   CSN: [id number] Arrival date & time: 05/21/16  0935     History   Chief Complaint Chief Complaint  Patient presents with  . Facial Droop  . Extremity Weakness  . Aphasia    HPI Paige Stewart is a 66 y.o. female.  HPI   Patient is 66 year old female past medical history significant for diabetes and hypertension with wake-up stroke. Patient last talked to family around 5 PM last night. Patient found unable to speak not moving right side this morning.  Patient arrived in the emergency Department immediately assessed by EDP. Patient noted to have right facial weakness and right arm and leg weakness. Patient unable to perform words correctly. Patient otherwise stable.  Patient unable to explain her symptoms at this time.  Past Medical History:  Diagnosis Date  . Diabetes mellitus without complication (HCC)   . Hypertension   . Neuropathy [facility])     Patient Active Problem List   Diagnosis Date Noted  . Stroke [facility]) 05/21/2016    Past Surgical History:  Procedure Laterality Date  . CESAREAN SECTION      OB History    No data available       Home Medications    Prior to Admission medications   Medication Sig Start Date End Date Taking? Authorizing Provider  gabapentin (NEURONTIN) 300 MG capsule Take 300 mg by mouth 2 (two) times daily.   Yes Historical Provider, MD  ibuprofen (ADVIL,MOTRIN) 200 MG tablet Take 200 mg by mouth every 6 (six) hours as needed for moderate pain.   Yes Historical Provider, MD  lisinopril (PRINIVIL,ZESTRIL) 10 MG tablet Take 1 tablet (10 mg total) by mouth daily. 05/04/16  Yes Audry Pili, PA-C  metFORMIN (GLUCOPHAGE) 1000 MG tablet Take 1,000 mg by mouth 2 (two) times daily with a meal.   Yes Historical Provider, MD  metoprolol tartrate (LOPRESSOR) 25 MG tablet Take 25 mg by mouth 2 (two) times daily.   Yes Historical Provider, MD  Multiple Vitamin (MULTIVITAMIN) tablet Take 1 tablet by mouth daily.   Yes  Historical Provider, MD  methocarbamol (ROBAXIN) 500 MG tablet Take 1 tablet (500 mg total) by mouth 2 (two) times daily. Patient not taking: Reported on 05/21/2016 05/04/16   Audry Pili, PA-C    Family History No family history on file.  Social History Social History  Substance Use Topics  . Smoking status: Never Smoker  . Smokeless tobacco: Never Used  . Alcohol use No     Allergies   Patient has no known allergies.   Review of Systems Review of Systems  Unable to perform ROS: Mental status change     Physical Exam Updated Vital Signs BP (!) 180/98   Pulse 91   Resp 17   Wt 148 lb (67.1 kg)   SpO2 100%   BMI 29.89 kg/m   Physical Exam  Constitutional: She appears well-developed and well-nourished.  HENT:  Head: Normocephalic and atraumatic.  Eyes: Right eye exhibits no discharge.  Cardiovascular: Normal rate.   Pulmonary/Chest: Effort normal.  Neurological:  Right facial droop. Unable to hold up her arm or leg against gravity. Patient unclear's words.  Skin: Skin is warm and dry. She is not diaphoretic.  Psychiatric: She has a normal mood and affect.  Nursing note and vitals reviewed.    ED Treatments / Results  Labs (all labs ordered are listed, but only abnormal results are displayed) Labs Reviewed  COMPREHENSIVE METABOLIC PANEL - Abnormal; Notable for  the following:       Result Value   Glucose, Bld 254 (*)    All other components within normal limits  I-STAT CHEM 8, ED - Abnormal; Notable for the following:    Glucose, Bld 256 (*)    All other components within normal limits  I-STAT TROPOININ, ED - Abnormal; Notable for the following:    Troponin i, poc 1.91 (*)    All other components within normal limits  ETHANOL  PROTIME-INR  APTT  CBC  DIFFERENTIAL  RAPID URINE DRUG SCREEN, HOSP PERFORMED  URINALYSIS, ROUTINE W REFLEX MICROSCOPIC  TROPONIN I  TROPONIN I  TROPONIN I    EKG  EKG Interpretation None       Radiology Ct Angio  Head W Or Wo Contrast  Result Date: 05/21/2016 CLINICAL DATA:  Right facial and arm and leg weakness. EXAM: CT ANGIOGRAPHY HEAD AND NECK CT PERFUSION BRAIN TECHNIQUE: Multidetector CT imaging of the head and neck was performed using the standard protocol during bolus administration of intravenous contrast. Multiplanar CT image reconstructions and MIPs were obtained to evaluate the vascular anatomy. Carotid stenosis measurements (when applicable) are obtained utilizing NASCET criteria, using the distal internal carotid diameter as the denominator. Multiphase CT imaging of the brain was performed following IV bolus contrast injection. Subsequent parametric perfusion maps were calculated using RAPID software. CONTRAST:  100 mL Isovue 370 IV COMPARISON:  CT head 05/21/2016 FINDINGS: CTA NECK FINDINGS Aortic arch: Minimal atherosclerotic disease in the aortic arch. Proximal great vessels tortuous but widely patent. Right carotid system: Right common carotid artery widely patent. Right internal carotid artery widely patent. Severe stenosis at the origin of the right external carotid artery Left carotid system: Left common carotid artery widely patent with mild atherosclerotic disease. Mild atherosclerotic calcification of the left carotid bifurcation without significant stenosis. Vertebral arteries: Both vertebral arteries patent without significant stenosis. Skeleton: Scoliosis. No acute skeletal abnormality. Minimal degenerative change in the cervical spine. Other neck: Multiple small thyroid nodules. No mass or adenopathy in the neck. Upper chest: Lung apices clear. Review of the MIP images confirms the above findings CTA HEAD FINDINGS Anterior circulation: Mild atherosclerotic disease in the right cavernous carotid artery without significant stenosis. Right anterior cerebral artery widely patent. Right M1 widely patent. Right middle cerebral artery branches widely patent without stenosis. Mild atherosclerotic  calcification left cavernous carotid without stenosis. Left anterior cerebral artery widely patent. Left M1 segment is widely patent. Anterior branch of the left middle cerebral artery shows decreased density suggesting hypoperfusion. There appears to be a stenosis at the origin of this M3 branch of the left middle cerebral artery supplying the left frontotemporal lobe. Posterior circulation: Both vertebral arteries patent to the basilar. Basilar patent. PICA, superior cerebellar, posterior cerebral arteries patent without significant stenosis or occlusion. Venous sinuses: Patent Anatomic variants: None Delayed phase: Not performed Review of the MIP images confirms the above findings CT Brain Perfusion Findings: CBF (<30%) Volume: 0mL Perfusion (Tmax>6.0s) volume: 23mL Mismatch Volume: 23mL Infarction Location:Left frontotemporal lobe and left frontal operculum IMPRESSION: 23 mL volume of hypoperfusion in the left frontotemporal lobe and left frontal operculum without fixed infarct by CT perfusion Hypoperfusion of the anterior branch of left MCA supplying the above area of infarction. This appears to be a left M3 branch which may have a severe stenosis. Left M1 is widely patent. No significant vertebral or carotid artery stenosis in the neck. The images were reviewed with Dr. Amada Jupiter at 1030 hours on 05/21/2016 Electronically Signed  By: Marlan Palau M.D.   On: 05/21/2016 10:53   Ct Angio Neck W And/or Wo Contrast  Result Date: 05/21/2016 CLINICAL DATA:  Right facial and arm and leg weakness. EXAM: CT ANGIOGRAPHY HEAD AND NECK CT PERFUSION BRAIN TECHNIQUE: Multidetector CT imaging of the head and neck was performed using the standard protocol during bolus administration of intravenous contrast. Multiplanar CT image reconstructions and MIPs were obtained to evaluate the vascular anatomy. Carotid stenosis measurements (when applicable) are obtained utilizing NASCET criteria, using the distal internal carotid  diameter as the denominator. Multiphase CT imaging of the brain was performed following IV bolus contrast injection. Subsequent parametric perfusion maps were calculated using RAPID software. CONTRAST:  100 mL Isovue 370 IV COMPARISON:  CT head 05/21/2016 FINDINGS: CTA NECK FINDINGS Aortic arch: Minimal atherosclerotic disease in the aortic arch. Proximal great vessels tortuous but widely patent. Right carotid system: Right common carotid artery widely patent. Right internal carotid artery widely patent. Severe stenosis at the origin of the right external carotid artery Left carotid system: Left common carotid artery widely patent with mild atherosclerotic disease. Mild atherosclerotic calcification of the left carotid bifurcation without significant stenosis. Vertebral arteries: Both vertebral arteries patent without significant stenosis. Skeleton: Scoliosis. No acute skeletal abnormality. Minimal degenerative change in the cervical spine. Other neck: Multiple small thyroid nodules. No mass or adenopathy in the neck. Upper chest: Lung apices clear. Review of the MIP images confirms the above findings CTA HEAD FINDINGS Anterior circulation: Mild atherosclerotic disease in the right cavernous carotid artery without significant stenosis. Right anterior cerebral artery widely patent. Right M1 widely patent. Right middle cerebral artery branches widely patent without stenosis. Mild atherosclerotic calcification left cavernous carotid without stenosis. Left anterior cerebral artery widely patent. Left M1 segment is widely patent. Anterior branch of the left middle cerebral artery shows decreased density suggesting hypoperfusion. There appears to be a stenosis at the origin of this M3 branch of the left middle cerebral artery supplying the left frontotemporal lobe. Posterior circulation: Both vertebral arteries patent to the basilar. Basilar patent. PICA, superior cerebellar, posterior cerebral arteries patent without  significant stenosis or occlusion. Venous sinuses: Patent Anatomic variants: None Delayed phase: Not performed Review of the MIP images confirms the above findings CT Brain Perfusion Findings: CBF (<30%) Volume: 0mL Perfusion (Tmax>6.0s) volume: 23mL Mismatch Volume: 23mL Infarction Location:Left frontotemporal lobe and left frontal operculum IMPRESSION: 23 mL volume of hypoperfusion in the left frontotemporal lobe and left frontal operculum without fixed infarct by CT perfusion Hypoperfusion of the anterior branch of left MCA supplying the above area of infarction. This appears to be a left M3 branch which may have a severe stenosis. Left M1 is widely patent. No significant vertebral or carotid artery stenosis in the neck. The images were reviewed with Dr. Amada Jupiter at 1030 hours on 05/21/2016 Electronically Signed   By: Marlan Palau M.D.   On: 05/21/2016 10:53   Mr Brain Wo Contrast  Result Date: 05/21/2016 CLINICAL DATA:  Right-sided weakness. EXAM: MRI HEAD WITHOUT CONTRAST TECHNIQUE: Multiplanar, multiecho pulse sequences of the brain and surrounding structures were obtained without intravenous contrast. COMPARISON:  Head CT/CTA/ cerebral perfusion 05/21/2016 FINDINGS: Brain: There is an acute left lateral lenticulostriate territory infarct extending from the body of the left caudate nucleus into the posterior left lentiform nucleus and external capsule region. There is no evidence of associated hemorrhage. No mass, midline shift, or extra-axial fluid collection is seen. There is mild cerebral atrophy. Patchy T2 hyperintensities throughout the subcortical and  deep cerebral white matter bilaterally are nonspecific but compatible with moderate chronic small vessel ischemic disease. There is a chronic lacunar infarct in the left caudate. Vascular: Major intracranial vascular flow voids are preserved. Skull and upper cervical spine: Unremarkable bone marrow signal. Sinuses/Orbits: Unremarkable orbits.  No  significant sinus disease. Other: None. IMPRESSION: 1. Acute left basal ganglia infarct. 2. Moderate chronic small vessel ischemic disease. Electronically Signed   By: Sebastian Ache M.D.   On: 05/21/2016 14:20   Ct Cerebral Perfusion W Contrast  Result Date: 05/21/2016 CLINICAL DATA:  Right facial and arm and leg weakness. EXAM: CT ANGIOGRAPHY HEAD AND NECK CT PERFUSION BRAIN TECHNIQUE: Multidetector CT imaging of the head and neck was performed using the standard protocol during bolus administration of intravenous contrast. Multiplanar CT image reconstructions and MIPs were obtained to evaluate the vascular anatomy. Carotid stenosis measurements (when applicable) are obtained utilizing NASCET criteria, using the distal internal carotid diameter as the denominator. Multiphase CT imaging of the brain was performed following IV bolus contrast injection. Subsequent parametric perfusion maps were calculated using RAPID software. CONTRAST:  100 mL Isovue 370 IV COMPARISON:  CT head 05/21/2016 FINDINGS: CTA NECK FINDINGS Aortic arch: Minimal atherosclerotic disease in the aortic arch. Proximal great vessels tortuous but widely patent. Right carotid system: Right common carotid artery widely patent. Right internal carotid artery widely patent. Severe stenosis at the origin of the right external carotid artery Left carotid system: Left common carotid artery widely patent with mild atherosclerotic disease. Mild atherosclerotic calcification of the left carotid bifurcation without significant stenosis. Vertebral arteries: Both vertebral arteries patent without significant stenosis. Skeleton: Scoliosis. No acute skeletal abnormality. Minimal degenerative change in the cervical spine. Other neck: Multiple small thyroid nodules. No mass or adenopathy in the neck. Upper chest: Lung apices clear. Review of the MIP images confirms the above findings CTA HEAD FINDINGS Anterior circulation: Mild atherosclerotic disease in the right  cavernous carotid artery without significant stenosis. Right anterior cerebral artery widely patent. Right M1 widely patent. Right middle cerebral artery branches widely patent without stenosis. Mild atherosclerotic calcification left cavernous carotid without stenosis. Left anterior cerebral artery widely patent. Left M1 segment is widely patent. Anterior branch of the left middle cerebral artery shows decreased density suggesting hypoperfusion. There appears to be a stenosis at the origin of this M3 branch of the left middle cerebral artery supplying the left frontotemporal lobe. Posterior circulation: Both vertebral arteries patent to the basilar. Basilar patent. PICA, superior cerebellar, posterior cerebral arteries patent without significant stenosis or occlusion. Venous sinuses: Patent Anatomic variants: None Delayed phase: Not performed Review of the MIP images confirms the above findings CT Brain Perfusion Findings: CBF (<30%) Volume: 0mL Perfusion (Tmax>6.0s) volume: 23mL Mismatch Volume: 23mL Infarction Location:Left frontotemporal lobe and left frontal operculum IMPRESSION: 23 mL volume of hypoperfusion in the left frontotemporal lobe and left frontal operculum without fixed infarct by CT perfusion Hypoperfusion of the anterior branch of left MCA supplying the above area of infarction. This appears to be a left M3 branch which may have a severe stenosis. Left M1 is widely patent. No significant vertebral or carotid artery stenosis in the neck. The images were reviewed with Dr. Amada Jupiter at 1030 hours on 05/21/2016 Electronically Signed   By: Marlan Palau M.D.   On: 05/21/2016 10:53   Ct Angio Chest/abd/pel For Dissection W And/or W/wo  Result Date: 05/21/2016 CLINICAL DATA:  Hypertension. Found down this morning with right facial droop, aphasia and right sided weakness. EXAM: CT  ANGIOGRAPHY CHEST, ABDOMEN AND PELVIS TECHNIQUE: Multidetector CT imaging through the chest, abdomen and pelvis was  performed using the standard protocol during bolus administration of intravenous contrast. Multiplanar reconstructed images and MIPs were obtained and reviewed to evaluate the vascular anatomy. CONTRAST:  75 cc Isovue 370 IV. COMPARISON:  05/04/2016 CT abdomen/ pelvis. FINDINGS: CTA CHEST FINDINGS Cardiovascular: Top-normal heart size. Small to moderate pericardial effusion/thickening, mildly increased. Left anterior descending, left circumflex and right coronary atherosclerosis. Atherosclerotic nonaneurysmal thoracic aorta. No thoracic aortic intramural hematoma, dissection, pseudoaneurysm or penetrating atherosclerotic ulcer. Common origin of the brachiocephalic and left common carotid artery is from the aortic arch. Patent visualized proximal vertebral arteries. Atherosclerotic aortic arch branch vessels with no significant stenoses. Normal caliber pulmonary arteries. No central pulmonary emboli. Mediastinum/Nodes: Subcentimeter hypodense anterior right thyroid lobe nodule. Unremarkable esophagus. No pathologically enlarged axillary, mediastinal or hilar lymph nodes. Lungs/Pleura: No pneumothorax. Trace dependent bilateral pleural effusions. Hypoventilatory changes in the dependent lungs. No acute consolidative airspace disease, lung masses or significant pulmonary nodules in the aerated portions of the lungs. Musculoskeletal: No aggressive appearing focal osseous lesions. Mild thoracic spondylosis Review of the MIP images confirms the above findings. CTA ABDOMEN AND PELVIS FINDINGS VASCULAR Aorta: Atherosclerotic nonaneurysmal abdominal aorta. No evidence of dissection, vasculitis or significant stenosis. Celiac: Patent without evidence of aneurysm, dissection, vasculitis or significant stenosis. SMA: Patent without evidence of aneurysm, dissection, vasculitis or significant stenosis. Renals: Single renal arteries bilaterally. Approximately 50% proximal right renal artery stenosis due to the atherosclerotic  plaque. No significant left renal artery stenosis. IMA: Approximately 60% proximal IMA stenosis due to atherosclerotic plaque. Inflow: Patent without evidence of aneurysm, dissection, vasculitis or significant stenosis. Veins: No obvious venous abnormality within the limitations of this arterial phase study. Review of the MIP images confirms the above findings. NON-VASCULAR Hepatobiliary: Normal liver with no liver mass. Normal gallbladder with no radiopaque cholelithiasis. No biliary ductal dilatation. Pancreas: Normal, with no mass or duct dilation. Spleen: Normal size. No mass. Adrenals/Urinary Tract: Normal adrenals. Symmetric contrast nephrograms. No hydronephrosis. No renal mass. 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 Normal bladder. Stomach/Bowel: Small hiatal hernia. Otherwise collapsed and grossly normal stomach. Normal caliber small bowel with no small bowel wall thickening. Normal appendix. Normal large bowel with no diverticulosis, large bowel wall thickening or pericolonic fat stranding. Vascular/Lymphatic: Atherosclerotic nonaneurysmal abdominal aorta. No pathologically enlarged lymph nodes in the abdomen or pelvis. Reproductive: Grossly normal uterus. Contrast in the vagina is probably due to retrograde filling from urinary incontinence. No adnexal mass. Other: No pneumoperitoneum, ascites or focal fluid collection. Musculoskeletal: No aggressive appearing focal osseous lesions. Review of the MIP images confirms the above findings. IMPRESSION: 1. No acute aortic syndrome. 2. Small to moderate pericardial effusion, mildly increased since 05/04/2016. Consider echocardiographic correlation. 3. Trace dependent bilateral pleural effusions. No acute pulmonary disease. 4. No acute abnormality in the abdomen or pelvis. 5. Three-vessel coronary atherosclerosis. 6. 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 7. Small hiatal hernia. Electronically Signed   By: Delbert Phenix M.D.   On: 05/21/2016 11:20   Ct Head Code Stroke Wo Contrast`  Result Date: 05/21/2016 CLINICAL DATA:  Code stroke.  Right-sided weakness EXAM: CT HEAD WITHOUT CONTRAST TECHNIQUE: Contiguous axial images were obtained from the base of the skull through the vertex without intravenous contrast. COMPARISON:  None. FINDINGS: Brain: Generalized atrophy. Low-density throughout the cerebral white matter bilaterally appears chronic. Negative for acute infarct. Negative for hemorrhage or mass lesion. No shift of the midline structures. Vascular: Negative for hyperdense vessel Skull: Negative Sinuses/Orbits: Mild mucosal edema left maxillary sinus. Normal orbit. Other: None ASPECTS ([place] Stroke Program Early CT Score) - Ganglionic level infarction (caudate, lentiform nuclei, internal capsule, insula, M1-M3 cortex): 7 - Supraganglionic infarction (M4-M6 cortex): 3 Total score (0-10 with 10 being normal): 10 IMPRESSION: 1. Negative for acute infarct 2. ASPECTS is 10 3. Atrophy and moderate chronic microvascular ischemic change in the white matter I reviewed the images with Dr. Amada Jupiter at 1015 hours 05/21/2016. Electronically Signed   By: Marlan Palau M.D.   On: 05/21/2016 10:30    Procedures Procedures (including critical care time)  Medications Ordered in ED Medications   stroke: mapping our early stages of recovery book (not administered)  acetaminophen (TYLENOL) tablet 650 mg (not administered)    Or  acetaminophen (TYLENOL) solution 650 mg (not administered)    Or  acetaminophen (TYLENOL) suppository 650 mg (not administered)  senna-docusate (Senokot-S) tablet 1 tablet (not administered)  insulin aspart (novoLOG) injection 0-9 Units (not administered)  0.9 %  sodium chloride infusion (not administered)  aspirin suppository 300 mg (not administered)    iopamidol (ISOVUE-370) 76 % injection (  Contrast Given 05/21/16 1045)     Initial Impression / Assessment and Plan / ED Course  I have reviewed the triage vital signs and the nursing notes.  Pertinent labs & imaging results that were available during my care of the patient were reviewed by me and considered in my medical decision making (see chart for details).  Clinical Course as of May 22 1515  Sun May 21, 2016  0950 Pt evaluated. Last normal by family 5:30pm yesterday. Woke up with right hemiparesis and facial droop. Aphasic. CT head/neck, CTA perfusion order. Neuro consulted. Stat labs ordered.  [BS]    Clinical Course User Index [BS] Carolynn Comment, MD    Patient is a 66 year old female pregnant presenting with wake-up stroke. Patient has right facial facial droop as well as right arm and leg weakness. Patient qualifies potentially for IR involvement. Dr. Amada Jupiter paged upon arrival. Patient will get CT perfusion study, CT angiogram of head and neck.  3:17 PM Patient noted to have elevated troponin in the setting of new neurologic changes mild concern for dissection. However patient already received dye load, we'll do CT noncontrast of chest and abdomen to make sure patient does not have large dissecting aorta. We'll do this while waiting for perfusion study be read.  Perf shows stroke not ammenable to IR.  Discussed EKG with cards, discussed heparin vs no and BP management.  Will hold heparin, let BP ride given ischemic stroke. Will do serial trop.    CRITICAL CARE Performed by: Arlana Hove Total critical care time: 45 minutes Critical care time was exclusive of separately billable procedures and treating other patients. Critical care was necessary to treat or prevent imminent or life-threatening deterioration. Critical care was time spent personally by me on the following activities: development of treatment plan with patient and/or surrogate as well as nursing,  discussions with consultants, evaluation of patient's response to treatment, examination of patient, obtaining history from patient or surrogate, ordering and performing treatments and interventions, ordering and review of laboratory studies, ordering and review of radiographic studies, pulse oximetry and re-evaluation of patient's condition.    Final Clinical Impressions(s) / ED  Diagnoses   Final diagnoses:  Right sided weakness    New Prescriptions Current Discharge Medication List       Terius Jacuinde Randall An, MD 05/21/16 1517

## 2016-05-21 NOTE — Progress Notes (Signed)
Documentation:   Troponin I was 3.4 from 1.9 in the ED. Went to evaluate patient she is not having any chest pain or shortness of breath. Discussed with neurology first; reported if patient needs beta blocker then go ahead and give although IV metoprolol can lower BP more than PO (patient failed swallow study) and IV heparin is not a complete contraindication. Called Cards on call Dr. Aniceto Boss to discuss. Per Dr. Anne Fu' note this morning, would not do IV heparin. Dr. Daphine Deutscher suggested trying low dose IV Metoprolol 2.5mg  once and monitor pressure; if okay then can do q 6 hr even at  dose. She reported would probably hold off on IV heparin for now as it is possible this is demand ischemia in the setting of stroke and not plaque rupture as risk may outweigh the benefit. Will order Metoprolol 2.5mg  once and monitor. Continue trending enzyme.   Palma Holter, MD  PGY 2 Family Medicine

## 2016-05-21 NOTE — ED Notes (Signed)
Unsuccessful attempt at calling report.

## 2016-05-21 NOTE — Progress Notes (Signed)
Lab called to notify nurse of critical value troponin of 3.40.  MD is notified.  Also mentioned to MD that neuro floor is not appropriate for patient with a troponin that is trending up-

## 2016-05-21 NOTE — ED Notes (Signed)
On way to MRI 

## 2016-05-21 NOTE — ED Notes (Signed)
Unsuccessful attempt at giving report.  Patient still in MRI.

## 2016-05-21 NOTE — ED Triage Notes (Signed)
Pt in from home via [facility] with stroke-like symptoms. Per EMS, pt was LSN last night at 10pm. At 0830, pt was found supine on floor with R facial droop, R arm and leg weakness and aphasia. Pt arrives awake, able to slur name. BP 200/110 for EMS, CBG 280. 18Ga to [facility]

## 2016-05-22 ENCOUNTER — Inpatient Hospital Stay (HOSPITAL_COMMUNITY): Payer: No Typology Code available for payment source

## 2016-05-22 DIAGNOSIS — I10 Essential (primary) hypertension: Secondary | ICD-10-CM

## 2016-05-22 DIAGNOSIS — G8191 Hemiplegia, unspecified affecting right dominant side: Secondary | ICD-10-CM

## 2016-05-22 LAB — GLUCOSE, CAPILLARY
GLUCOSE-CAPILLARY: 171 mg/dL — AB (ref 65–99)
GLUCOSE-CAPILLARY: 174 mg/dL — AB (ref 65–99)
GLUCOSE-CAPILLARY: 181 mg/dL — AB (ref 65–99)
GLUCOSE-CAPILLARY: 214 mg/dL — AB (ref 65–99)
Glucose-Capillary: 198 mg/dL — ABNORMAL HIGH (ref 65–99)
Glucose-Capillary: 203 mg/dL — ABNORMAL HIGH (ref 65–99)

## 2016-05-22 LAB — CBC
HEMATOCRIT: 36.6 % (ref 36.0–46.0)
HEMOGLOBIN: 12.9 g/dL (ref 12.0–15.0)
MCH: 30.5 pg (ref 26.0–34.0)
MCHC: 35.2 g/dL (ref 30.0–36.0)
MCV: 86.5 fL (ref 78.0–100.0)
Platelets: 232 10*3/uL (ref 150–400)
RBC: 4.23 MIL/uL (ref 3.87–5.11)
RDW: 13.4 % (ref 11.5–15.5)
WBC: 4.7 10*3/uL (ref 4.0–10.5)

## 2016-05-22 LAB — URINALYSIS, ROUTINE W REFLEX MICROSCOPIC
BILIRUBIN URINE: NEGATIVE
Glucose, UA: NEGATIVE mg/dL
Ketones, ur: 5 mg/dL — AB
NITRITE: NEGATIVE
PH: 7 (ref 5.0–8.0)
Protein, ur: NEGATIVE mg/dL
SPECIFIC GRAVITY, URINE: 1.006 (ref 1.005–1.030)

## 2016-05-22 LAB — LIPID PANEL
CHOL/HDL RATIO: 4.9 ratio
Cholesterol: 168 mg/dL (ref 0–200)
HDL: 34 mg/dL — AB (ref >40)
LDL CALC: 118 mg/dL — AB (ref 0–99)
TRIGLYCERIDES: 81 mg/dL (ref <150)
VLDL: 16 mg/dL (ref 0–40)

## 2016-05-22 LAB — BASIC METABOLIC PANEL
ANION GAP: 8 (ref 5–15)
BUN: 11 mg/dL (ref 6–20)
CHLORIDE: 105 mmol/L (ref 101–111)
CO2: 26 mmol/L (ref 22–32)
Calcium: 9 mg/dL (ref 8.9–10.3)
Creatinine, Ser: 0.71 mg/dL (ref 0.44–1.00)
GFR calc non Af Amer: >60 mL/min (ref >60)
GLUCOSE: 183 mg/dL — AB (ref 65–99)
POTASSIUM: 3.5 mmol/L (ref 3.5–5.1)
Sodium: 139 mmol/L (ref 135–145)

## 2016-05-22 LAB — TROPONIN I
TROPONIN I: 3.94 ng/mL — AB (ref <0.03)
TROPONIN I: 4.13 ng/mL — AB (ref <0.03)
TROPONIN I: 4.12 ng/mL — AB (ref <0.03)
Troponin I: 4 ng/mL (ref <0.03)

## 2016-05-22 LAB — CK: Total CK: 105 U/L (ref 38–234)

## 2016-05-22 MED ORDER — ASPIRIN 300 MG RE SUPP
300.0000 mg | Freq: Every day | RECTAL | Status: DC
  Administered 2016-05-22 – 2016-05-23 (×2): 300 mg via RECTAL
  Filled 2016-05-22 (×2): qty 1

## 2016-05-22 MED ORDER — ATORVASTATIN CALCIUM 40 MG PO TABS
40.0000 mg | ORAL_TABLET | Freq: Every day | ORAL | Status: DC
  Administered 2016-05-22 – 2016-05-25 (×4): 40 mg via ORAL
  Filled 2016-05-22 (×4): qty 1

## 2016-05-22 MED ORDER — METOPROLOL TARTRATE 5 MG/5ML IV SOLN
2.5000 mg | Freq: Four times a day (QID) | INTRAVENOUS | Status: DC
  Administered 2016-05-22 – 2016-05-23 (×6): 2.5 mg via INTRAVENOUS
  Filled 2016-05-22 (×5): qty 5

## 2016-05-22 NOTE — Progress Notes (Signed)
OT Cancellation Note  Patient Details Name: Paige Stewart MRN: [id number] DOB: 08/15/1950   Cancelled Treatment:    Reason Eval/Treat Not Completed: Medical issues which prohibited therapy (Pt with bed rest order. Will evaluate when upgraded.)  Evern Bio 05/22/2016, 8:38 AM  [phone]

## 2016-05-22 NOTE — Progress Notes (Signed)
Modified Barium Swallow Progress Note  Patient Details  Name: Paige Stewart MRN: [id number] Date of Birth: 02/28/1950  Today's Date: 05/22/2016  Modified Barium Swallow completed.  Full report located under Chart Review in the Imaging Section.  Brief recommendations include the following:  Clinical Impression  Pt presents with a moderate oral, mild pharyngeal dysphagia marked by reduced sensorimotor function - this leads to poor bolus cohesion and propulsion, anterior loss right side, right oral residue.  Swallow onset is delayed, particularly with thin liquids, which reach the pyriforms and then are aspirated during the swallow (accompanied by cough).  A chin tuck effectively prevented aspiration, but pt required significant cues to help her to carry-out chin tuck. There was good pharyngeal clearance of all POs.  Esophageal sweep revealed barium retention in the distal esophagus which remained throughout the study.  For now, recommend initiating a dysphagia 1 diet with nectar thick liquids.  Outside of meals, pt may have sips of thin liquid if she has 1:1 cueing and assist to tuck her chin.  Meds may be given whole in puree.  SLP will follow for safety/diet progression and pending speech language evaluation.  Pt's dtr present for exam and results/recs were reviewed.    Swallow Evaluation Recommendations       SLP Diet Recommendations: Dysphagia 1 (Puree) solids;Nectar thick liquid   Liquid Administration via: Cup   Medication Administration: Whole meds with puree   Supervision: Staff to assist with self feeding   Compensations: Minimize environmental distractions;Slow rate;Small sips/bites;Lingual sweep for clearance of pocketing   Postural Changes: Remain semi-upright after after feeds/meals (Comment)   Oral Care Recommendations: Oral care BID   Other Recommendations: Remove water pitcher;Have oral suction available    [name] [name] 05/22/2016,2:38 PM

## 2016-05-22 NOTE — Progress Notes (Signed)
PT Cancellation Note  Patient Details Name: Paige Stewart MRN: [id number] DOB: 04/03/1950   Cancelled Treatment:    Reason Eval/Treat Not Completed: Medical issues which prohibited therapy, pt currently on bedrest and being evaluated due to mental status changes. Will follow when activity orders upgraded.    [name] [name] 05/22/2016, 11:16 AM  [phone]

## 2016-05-22 NOTE — Progress Notes (Addendum)
Progress Note  Patient Name: Paige Stewart Date of Encounter: 05/22/2016  Primary Cardiologist: Anne Fu  Subjective   Not having any CP (shakes head no). No SOB. Daughter with her. Tired  Inpatient Medications    Scheduled Meds: . aspirin  300 mg Rectal Daily  . insulin aspart  0-9 Units Subcutaneous Q4H  . metoprolol  2.5 mg Intravenous Q6H   Continuous Infusions: . sodium chloride 100 mL/hr at 05/22/16 0412   PRN Meds: acetaminophen **OR** acetaminophen (TYLENOL) oral liquid 160 mg/5 mL **OR** acetaminophen, senna-docusate   Vital Signs    Vitals:   05/22/16 0112 05/22/16 0239 05/22/16 0439 05/22/16 0548  BP: (!) 184/94 (!) 177/100 (!) 160/92   Pulse: 82 81 80   Resp: 12 (!) 21 16   Temp: 98.2 F (36.8 C)     TempSrc: Oral     SpO2: 100% 100% 100%   Weight:    146 lb 12.8 oz (66.6 kg)    Intake/Output Summary (Last 24 hours) at 05/22/16 0833 Last data filed at 05/22/16 0700  Gross per 24 hour  Intake          1136.24 ml  Output                0 ml  Net          1136.24 ml   Filed Weights   05/21/16 0951 05/22/16 0548  Weight: 148 lb (67.1 kg) 146 lb 12.8 oz (66.6 kg)    Telemetry    No adverse rhythms - Personally Reviewed  ECG    NSR with TWI noted - Personally Reviewed  Physical Exam   GEN: No acute distress.  Tired appearing Neck: No JVD Cardiac: RRR, no murmurs, rubs, or gallops.  Respiratory: Clear to auscultation bilaterally. GI: Soft, nontender, non-distended obese MS: No edema; No deformity. Neuro:  Right sided weakness, non verbal  Psych: Normal affect   Labs    Chemistry Recent Labs Lab 05/21/16 0953 05/21/16 1006 05/22/16 0006  NA 137 138 139  K 3.8 3.8 3.5  CL 101 101 105  CO2 27  --  26  GLUCOSE 254* 256* 183*  BUN CREATININE 0.76 0.70 0.71  CALCIUM 9.3  --  9.0  PROT 7.2  --   --   ALBUMIN 3.9  --   --   AST 21  --   --   ALT 17  --   --   ALKPHOS 51  --   --   BILITOT 0.4  --   --   GFRNONAA >60   --  >60  GFRAA >60  --  >60  ANIONGAP 9  --  8     Hematology Recent Labs Lab 05/21/16 0953 05/21/16 1006 05/22/16 0006  WBC 5.1  --  4.7  RBC 4.29  --  4.23  HGB 12.6 13.3 12.9  HCT 37.3 39.0 36.6  MCV 86.9  --  86.5  MCH 29.4  --  30.5  MCHC 33.8  --  35.2  RDW 13.2  --  13.4  PLT 233  --  232    Cardiac Enzymes Recent Labs Lab 05/21/16 1446 05/21/16 1804 05/22/16 0006 05/22/16 0604  TROPONINI 3.40* 3.50* 4.13* 3.94*    Recent Labs Lab 05/21/16 1000  TROPIPOC 1.91*     BNPNo results for input(s): BNP, PROBNP in the last 168 hours.   DDimer No results for input(s): DDIMER in the last 168 hours.  Radiology    Ct Angio Head W Or Wo Contrast  Result Date: 05/21/2016 CLINICAL DATA:  Right facial and arm and leg weakness. EXAM: CT ANGIOGRAPHY HEAD AND NECK CT PERFUSION BRAIN TECHNIQUE: Multidetector CT imaging of the head and neck was performed using the standard protocol during bolus administration of intravenous contrast. Multiplanar CT image reconstructions and MIPs were obtained to evaluate the vascular anatomy. Carotid stenosis measurements (when applicable) are obtained utilizing NASCET criteria, using the distal internal carotid diameter as the denominator. Multiphase CT imaging of the brain was performed following IV bolus contrast injection. Subsequent parametric perfusion maps were calculated using RAPID software. CONTRAST:  100 mL Isovue 370 IV COMPARISON:  CT head 05/21/2016 FINDINGS: CTA NECK FINDINGS Aortic arch: Minimal atherosclerotic disease in the aortic arch. Proximal great vessels tortuous but widely patent. Right carotid system: Right common carotid artery widely patent. Right internal carotid artery widely patent. Severe stenosis at the origin of the right external carotid artery Left carotid system: Left common carotid artery widely patent with mild atherosclerotic disease. Mild atherosclerotic calcification of the left carotid bifurcation without  significant stenosis. Vertebral arteries: Both vertebral arteries patent without significant stenosis. Skeleton: Scoliosis. No acute skeletal abnormality. Minimal degenerative change in the cervical spine. Other neck: Multiple small thyroid nodules. No mass or adenopathy in the neck. Upper chest: Lung apices clear. Review of the MIP images confirms the above findings CTA HEAD FINDINGS Anterior circulation: Mild atherosclerotic disease in the right cavernous carotid artery without significant stenosis. Right anterior cerebral artery widely patent. Right M1 widely patent. Right middle cerebral artery branches widely patent without stenosis. Mild atherosclerotic calcification left cavernous carotid without stenosis. Left anterior cerebral artery widely patent. Left M1 segment is widely patent. Anterior branch of the left middle cerebral artery shows decreased density suggesting hypoperfusion. There appears to be a stenosis at the origin of this M3 branch of the left middle cerebral artery supplying the left frontotemporal lobe. Posterior circulation: Both vertebral arteries patent to the basilar. Basilar patent. PICA, superior cerebellar, posterior cerebral arteries patent without significant stenosis or occlusion. Venous sinuses: Patent Anatomic variants: None Delayed phase: Not performed Review of the MIP images confirms the above findings CT Brain Perfusion Findings: CBF (<30%) Volume: 0mL Perfusion (Tmax>6.0s) volume: 23mL Mismatch Volume: 23mL Infarction Location:Left frontotemporal lobe and left frontal operculum IMPRESSION: 23 mL volume of hypoperfusion in the left frontotemporal lobe and left frontal operculum without fixed infarct by CT perfusion Hypoperfusion of the anterior branch of left MCA supplying the above area of infarction. This appears to be a left M3 branch which may have a severe stenosis. Left M1 is widely patent. No significant vertebral or carotid artery stenosis in the neck. The images were  reviewed with Dr. Amada Jupiter at 1030 hours on 05/21/2016 Electronically Signed   By: Marlan Palau M.D.   On: 05/21/2016 10:53   Ct Angio Neck W And/or Wo Contrast  Result Date: 05/21/2016 CLINICAL DATA:  Right facial and arm and leg weakness. EXAM: CT ANGIOGRAPHY HEAD AND NECK CT PERFUSION BRAIN TECHNIQUE: Multidetector CT imaging of the head and neck was performed using the standard protocol during bolus administration of intravenous contrast. Multiplanar CT image reconstructions and MIPs were obtained to evaluate the vascular anatomy. Carotid stenosis measurements (when applicable) are obtained utilizing NASCET criteria, using the distal internal carotid diameter as the denominator. Multiphase CT imaging of the brain was performed following IV bolus contrast injection. Subsequent parametric perfusion maps were calculated using RAPID software. CONTRAST:  100  mL Isovue 370 IV COMPARISON:  CT head 05/21/2016 FINDINGS: CTA NECK FINDINGS Aortic arch: Minimal atherosclerotic disease in the aortic arch. Proximal great vessels tortuous but widely patent. Right carotid system: Right common carotid artery widely patent. Right internal carotid artery widely patent. Severe stenosis at the origin of the right external carotid artery Left carotid system: Left common carotid artery widely patent with mild atherosclerotic disease. Mild atherosclerotic calcification of the left carotid bifurcation without significant stenosis. Vertebral arteries: Both vertebral arteries patent without significant stenosis. Skeleton: Scoliosis. No acute skeletal abnormality. Minimal degenerative change in the cervical spine. Other neck: Multiple small thyroid nodules. No mass or adenopathy in the neck. Upper chest: Lung apices clear. Review of the MIP images confirms the above findings CTA HEAD FINDINGS Anterior circulation: Mild atherosclerotic disease in the right cavernous carotid artery without significant stenosis. Right anterior  cerebral artery widely patent. Right M1 widely patent. Right middle cerebral artery branches widely patent without stenosis. Mild atherosclerotic calcification left cavernous carotid without stenosis. Left anterior cerebral artery widely patent. Left M1 segment is widely patent. Anterior branch of the left middle cerebral artery shows decreased density suggesting hypoperfusion. There appears to be a stenosis at the origin of this M3 branch of the left middle cerebral artery supplying the left frontotemporal lobe. Posterior circulation: Both vertebral arteries patent to the basilar. Basilar patent. PICA, superior cerebellar, posterior cerebral arteries patent without significant stenosis or occlusion. Venous sinuses: Patent Anatomic variants: None Delayed phase: Not performed Review of the MIP images confirms the above findings CT Brain Perfusion Findings: CBF (<30%) Volume: 0mL Perfusion (Tmax>6.0s) volume: 23mL Mismatch Volume: 23mL Infarction Location:Left frontotemporal lobe and left frontal operculum IMPRESSION: 23 mL volume of hypoperfusion in the left frontotemporal lobe and left frontal operculum without fixed infarct by CT perfusion Hypoperfusion of the anterior branch of left MCA supplying the above area of infarction. This appears to be a left M3 branch which may have a severe stenosis. Left M1 is widely patent. No significant vertebral or carotid artery stenosis in the neck. The images were reviewed with Dr. Amada Jupiter at 1030 hours on 05/21/2016 Electronically Signed   By: Marlan Palau M.D.   On: 05/21/2016 10:53   Mr Brain Wo Contrast  Result Date: 05/21/2016 CLINICAL DATA:  Right-sided weakness. EXAM: MRI HEAD WITHOUT CONTRAST TECHNIQUE: Multiplanar, multiecho pulse sequences of the brain and surrounding structures were obtained without intravenous contrast. COMPARISON:  Head CT/CTA/ cerebral perfusion 05/21/2016 FINDINGS: Brain: There is an acute left lateral lenticulostriate territory infarct  extending from the body of the left caudate nucleus into the posterior left lentiform nucleus and external capsule region. There is no evidence of associated hemorrhage. No mass, midline shift, or extra-axial fluid collection is seen. There is mild cerebral atrophy. Patchy T2 hyperintensities throughout the subcortical and deep cerebral white matter bilaterally are nonspecific but compatible with moderate chronic small vessel ischemic disease. There is a chronic lacunar infarct in the left caudate. Vascular: Major intracranial vascular flow voids are preserved. Skull and upper cervical spine: Unremarkable bone marrow signal. Sinuses/Orbits: Unremarkable orbits.  No significant sinus disease. Other: None. IMPRESSION: 1. Acute left basal ganglia infarct. 2. Moderate chronic small vessel ischemic disease. Electronically Signed   By: Sebastian Ache M.D.   On: 05/21/2016 14:20   Ct Cerebral Perfusion W Contrast  Result Date: 05/21/2016 CLINICAL DATA:  Right facial and arm and leg weakness. EXAM: CT ANGIOGRAPHY HEAD AND NECK CT PERFUSION BRAIN TECHNIQUE: Multidetector CT imaging of the head and neck  was performed using the standard protocol during bolus administration of intravenous contrast. Multiplanar CT image reconstructions and MIPs were obtained to evaluate the vascular anatomy. Carotid stenosis measurements (when applicable) are obtained utilizing NASCET criteria, using the distal internal carotid diameter as the denominator. Multiphase CT imaging of the brain was performed following IV bolus contrast injection. Subsequent parametric perfusion maps were calculated using RAPID software. CONTRAST:  100 mL Isovue 370 IV COMPARISON:  CT head 05/21/2016 FINDINGS: CTA NECK FINDINGS Aortic arch: Minimal atherosclerotic disease in the aortic arch. Proximal great vessels tortuous but widely patent. Right carotid system: Right common carotid artery widely patent. Right internal carotid artery widely patent. Severe stenosis  at the origin of the right external carotid artery Left carotid system: Left common carotid artery widely patent with mild atherosclerotic disease. Mild atherosclerotic calcification of the left carotid bifurcation without significant stenosis. Vertebral arteries: Both vertebral arteries patent without significant stenosis. Skeleton: Scoliosis. No acute skeletal abnormality. Minimal degenerative change in the cervical spine. Other neck: Multiple small thyroid nodules. No mass or adenopathy in the neck. Upper chest: Lung apices clear. Review of the MIP images confirms the above findings CTA HEAD FINDINGS Anterior circulation: Mild atherosclerotic disease in the right cavernous carotid artery without significant stenosis. Right anterior cerebral artery widely patent. Right M1 widely patent. Right middle cerebral artery branches widely patent without stenosis. Mild atherosclerotic calcification left cavernous carotid without stenosis. Left anterior cerebral artery widely patent. Left M1 segment is widely patent. Anterior branch of the left middle cerebral artery shows decreased density suggesting hypoperfusion. There appears to be a stenosis at the origin of this M3 branch of the left middle cerebral artery supplying the left frontotemporal lobe. Posterior circulation: Both vertebral arteries patent to the basilar. Basilar patent. PICA, superior cerebellar, posterior cerebral arteries patent without significant stenosis or occlusion. Venous sinuses: Patent Anatomic variants: None Delayed phase: Not performed Review of the MIP images confirms the above findings CT Brain Perfusion Findings: CBF (<30%) Volume: 0mL Perfusion (Tmax>6.0s) volume: 23mL Mismatch Volume: 23mL Infarction Location:Left frontotemporal lobe and left frontal operculum IMPRESSION: 23 mL volume of hypoperfusion in the left frontotemporal lobe and left frontal operculum without fixed infarct by CT perfusion Hypoperfusion of the anterior branch of left  MCA supplying the above area of infarction. This appears to be a left M3 branch which may have a severe stenosis. Left M1 is widely patent. No significant vertebral or carotid artery stenosis in the neck. The images were reviewed with Dr. Amada Jupiter at 1030 hours on 05/21/2016 Electronically Signed   By: Marlan Palau M.D.   On: 05/21/2016 10:53   Ct Angio Chest/abd/pel For Dissection W And/or W/wo  Result Date: 05/21/2016 CLINICAL DATA:  Hypertension. Found down this morning with right facial droop, aphasia and right sided weakness. EXAM: CT ANGIOGRAPHY CHEST, ABDOMEN AND PELVIS TECHNIQUE: Multidetector CT imaging through the chest, abdomen and pelvis was performed using the standard protocol during bolus administration of intravenous contrast. Multiplanar reconstructed images and MIPs were obtained and reviewed to evaluate the vascular anatomy. CONTRAST:  75 cc Isovue 370 IV. COMPARISON:  05/04/2016 CT abdomen/ pelvis. FINDINGS: CTA CHEST FINDINGS Cardiovascular: Top-normal heart size. Small to moderate pericardial effusion/thickening, mildly increased. Left anterior descending, left circumflex and right coronary atherosclerosis. Atherosclerotic nonaneurysmal thoracic aorta. No thoracic aortic intramural hematoma, dissection, pseudoaneurysm or penetrating atherosclerotic ulcer. Common origin of the brachiocephalic and left common carotid artery is from the aortic arch. Patent visualized proximal vertebral arteries. Atherosclerotic aortic arch branch vessels with no  significant stenoses. Normal caliber pulmonary arteries. No central pulmonary emboli. Mediastinum/Nodes: Subcentimeter hypodense anterior right thyroid lobe nodule. Unremarkable esophagus. No pathologically enlarged axillary, mediastinal or hilar lymph nodes. Lungs/Pleura: No pneumothorax. Trace dependent bilateral pleural effusions. Hypoventilatory changes in the dependent lungs. No acute consolidative airspace disease, lung masses or significant  pulmonary nodules in the aerated portions of the lungs. Musculoskeletal: No aggressive appearing focal osseous lesions. Mild thoracic spondylosis Review of the MIP images confirms the above findings. CTA ABDOMEN AND PELVIS FINDINGS VASCULAR Aorta: Atherosclerotic nonaneurysmal abdominal aorta. No evidence of dissection, vasculitis or significant stenosis. Celiac: Patent without evidence of aneurysm, dissection, vasculitis or significant stenosis. SMA: Patent without evidence of aneurysm, dissection, vasculitis or significant stenosis. Renals: Single renal arteries bilaterally. Approximately 50% proximal right renal artery stenosis due to the atherosclerotic plaque. No significant left renal artery stenosis. IMA: Approximately 60% proximal IMA stenosis due to atherosclerotic plaque. Inflow: Patent without evidence of aneurysm, dissection, vasculitis or significant stenosis. Veins: No obvious venous abnormality within the limitations of this arterial phase study. Review of the MIP images confirms the above findings. NON-VASCULAR Hepatobiliary: Normal liver with no liver mass. Normal gallbladder with no radiopaque cholelithiasis. No biliary ductal dilatation. Pancreas: Normal, with no mass or duct dilation. Spleen: Normal size. No mass. Adrenals/Urinary Tract: Normal adrenals. Symmetric contrast nephrograms. No hydronephrosis. No renal mass. 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 Normal bladder. Stomach/Bowel: Small hiatal hernia. Otherwise collapsed and grossly normal stomach. Normal caliber small bowel with no small bowel wall thickening. Normal appendix. Normal large bowel with no diverticulosis, large bowel wall thickening or pericolonic fat stranding. Vascular/Lymphatic: Atherosclerotic nonaneurysmal abdominal aorta. No pathologically enlarged lymph nodes in the abdomen or pelvis.  Reproductive: Grossly normal uterus. Contrast in the vagina is probably due to retrograde filling from urinary incontinence. No adnexal mass. Other: No pneumoperitoneum, ascites or focal fluid collection. Musculoskeletal: No aggressive appearing focal osseous lesions. Review of the MIP images confirms the above findings. IMPRESSION: 1. No acute aortic syndrome. 2. Small to moderate pericardial effusion, mildly increased since 05/04/2016. Consider echocardiographic correlation. 3. Trace dependent bilateral pleural effusions. No acute pulmonary disease. 4. No acute abnormality in the abdomen or pelvis. 5. Three-vessel coronary atherosclerosis. 6. 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 7. Small hiatal hernia. Electronically Signed   By: Delbert Phenix M.D.   On: 05/21/2016 11:20   Ct Head Code Stroke Wo Contrast`  Result Date: 05/21/2016 CLINICAL DATA:  Code stroke.  Right-sided weakness EXAM: CT HEAD WITHOUT CONTRAST TECHNIQUE: Contiguous axial images were obtained from the base of the skull through the vertex without intravenous contrast. COMPARISON:  None. FINDINGS: Brain: Generalized atrophy. Low-density throughout the cerebral white matter bilaterally appears chronic. Negative for acute infarct. Negative for hemorrhage or mass lesion. No shift of the midline structures. Vascular: Negative for hyperdense vessel Skull: Negative Sinuses/Orbits: Mild mucosal edema left maxillary sinus. Normal orbit. Other: None ASPECTS ([place] Stroke Program Early CT Score) - Ganglionic level infarction (caudate, lentiform nuclei, internal capsule, insula, M1-M3 cortex): 7 - Supraganglionic infarction (M4-M6 cortex): 3 Total score (0-10 with 10 being normal): 10 IMPRESSION: 1. Negative for acute infarct 2. ASPECTS is 10 3. Atrophy and moderate chronic microvascular ischemic change in the white matter I reviewed the images with Dr. Amada Jupiter at 1015 hours 05/21/2016.  Electronically Signed  By: Marlan Palau M.D.   On: 05/21/2016 10:30    Cardiac Studies   ECHO P  Patient Profile     66 y.o. female with demand ischemia (elevated troponin in the setting of stroke) with abnormal ECG, no angina, normal CT angiogram (no dissection) in the setting of acute stroke (right sided weakness, aphasia)  Assessment & Plan    Demand ischemia  - troponin elevated in the setting of CVA and severe hypertension (likely in part supply demand mismatch), no anginal symptoms.   - ECG TWI are often seen in stroke as well. ST changes mild inferior j point 1mm changes. TWI biphasic in V2.   - CTA reassuring  - No invasive workup at this time. As she heals from CVA, several weeks down the road, we could consider outpatient NUC stress.   - Statin, metoprolol, ASA (Plavix if able to take from neuro perspective)  - Avoiding IV heparin because of risk of hemorrhagic transformation  Hypertension  - reported non compliance with meds at home  - improved here with elevation in the setting of ischemic CVA.   Acute stroke  - per primary team/neuro   Signed, Donato Schultz, MD  05/22/2016, 8:33 AM

## 2016-05-22 NOTE — Discharge Summary (Signed)
Family Medicine Teaching [facility] Discharge Summary  Patient name: Paige Stewart Medical record number: [id number] Date of birth: 05/05/1950 Age: 66 y.o. Gender: female Date of Admission: 05/21/2016  Date of Discharge: 05/25/2016 Admitting Physician: Nestor Ramp, MD  Primary Care Provider: Pcp Not In System Consultants: Neurology Stroke Team, cardiology  Indication for Hospitalization: Acute Stroke  Discharge Diagnoses/Problem List:  Patient Active Problem List   Diagnosis Date Noted  . Type 2 diabetes mellitus without complication, without long-term current use of insulin (HCC)   . Urinary tract infection without hematuria   . Demand ischemia (HCC)   . Hypertension   . Right sided weakness   . 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    Disposition: CIR  Discharge Condition: Stable/Improved  Discharge Exam:  Temp:  [97.9 F (36.6 C)-99 F (37.2 C)] 98.5 F (36.9 C) (04/05 0717) Pulse Rate:  [75-110] 110 (04/05 0904) Resp:  [15-22] 20 (04/05 0904) BP: (153-184)/(85-106) 163/90 (04/05 0904) SpO2:  [95 %-100 %] 99 % (04/05 0904) Weight:  [67 kg (147 lb 12.8 oz)] 67 kg (147 lb 12.8 oz) (04/05 0443) Physical Exam: General: NAD, comfortable Cardiovascular: RRR, no m/r/g Respiratory: CTA bil, no W/R/R Abdomen: soft and nontender, non-distended Extremities: no LE edema, warm and well-perfused, no skin breakdown or lesions Neuro: +Dysarthria, Cn 2-4 intact. Decreased movement of right side of face. CN 6 intact. CN 8-12 normal except right shoulder shrug decreased.  R arm and leg weakness (1/5), R arm and R leg strength normal 4/5 bilaterally   Brief Hospital Course:   CVA - with Right Sided Weakness, facial droop and dysarthria. CT perfusion with small area of penumbra in the anterior MCA per neurology. MR brain with acute basal ganglia infarct. Code stroke was called and stroke team followed patient throughout her hospitalization. Risk stratification labs were performed. High  intensity statin and Aspirin were started. Neurology followed until signing off, asked that patient follow up after discharge.  Patient continues to have right sided weakness in upper and lower extremities, right sided facial droop and dysarthria at discharge.  She was discharged to [facility] for continued physical therapy. Honey thickened dysphagia 1 diet was recommended.  Elevated Troponin and EKG Changes Patient noted to have elevated troponin on admission that peaked at 4.13, and EKG changes including T wave inversion in 1, aVL and V2 with mild ST elevation in V2. Cardiology was consulted and these changes were thought to be in setting of acute stroke. Cardiac echo and TEE were performed per stroke protocol, and there was akinesis of apical myocardium and severe hypokinesis of the anterior myocardium (full report below). It was uncertain whether this akinesis was resultant of an acute event or a prior event.  Dual antiplatelet therapy and continued blood pressure control was recommended. Cardiology indicated that a NUC stress test may be considered in the future if the patient stays in the [place] for a couple of months.  UTI/Pyelonephritis Patient complained of dysuria, no hematuria, with urinary frequency. Urinalysis was consistent with UTI and she was started on Keflex. She received 1 day of this antibiotic, however became febrile to 101.7, noted CVA tenderness on the left side the following morning. Her antibiotics were transitioned based on urine culture sensitivities to Bactrim 800-160 mg g twice a day for pyelonephritis. to complete a 7 -day course (4/5 -through 4/11)  Diabetes, poor control From risk stratification after stroke, she was found to have an elevated HbA1c 10.3. She was on metformin at home. Decision was  made to start NPH insulin at 5u BID. Pharmacy student researched that  NPH insulin may be available in [place]. She was discharged on her home metformin as well as NPH 5u twice daily  to be continued titrated up as necessary after discharge. Diabetes teaching was done with the patient and her daughter in the hospital.  Issues for Follow Up:  1. Patient was discharged new medications including atorvastatin, NPH, and dual antiplatelet therapy. Please review medications with her to ensure she continues to take these appropriately. 2. For her pyelo, patient was discharged to continue bactrim BID through 4/11. 3. Cardiology - consider outpatient NUC stress test 2-3 months after discharge if the patient is still in the [place].  4. Follow-up Stroke Clinic at [facility] with Dr. Delia Heady in 2 months, if still in the [place], order placed.  Significant Procedures: TEE, Cardiac Echo (below)  Significant Labs and Imaging:  EKG with T wave inversion in 1, aVL and V2 with mild ST elevation in V2.  Echo 4/3 - Left ventricle: The cavity size was normal. There was mild focal basal hypertrophy of the septum. Systolic function was mildly to moderately reduced. The estimated ejection fraction was in the range of 40% to 45%. Severe hypokinesis of the mid-apicalanteroseptal and anterior myocardium; consistent with ischemia in the distribution of the left anterior descending coronary artery. Akinesis of the apical myocardium. There was fusion of early and atrial contributions to ventricular filling. The study is not technically sufficient to allow evaluation of LV diastolic function. No evidence of thrombus. - Atrial septum: Echo contrast study showed no right-to-left atrial level shunt, at baseline or with provocation. - Pericardium, extracardiac: A small pericardial effusion was identified anterior to the heart. The fluid had no internal echoes.There was no evidence of hemodynamic compromise.  TEE 4/3 - Left ventricle: There is akinesis of the mid anteroseptal, anterior and apical septal and anterior walls. Wall thickness was increased in  a pattern of moderate LVH. Systolic function was mildly reduced. The estimated ejection fraction was in the range of 45% to 50%. - Aorta: There was moderate non-mobile atheroma. - Descending aorta: The descending aorta was normal in size. - Mitral valve: There was no significant regurgitation. - Left atrium: No evidence of thrombus in the atrial cavity or appendage. No evidence of thrombus in the atrial cavity or appendage. - Right ventricle: Systolic function was normal. - Right atrium: Chiari network seen in the right atrium. No evidence of thrombus in the atrial cavity or appendage. - Atrial septum: No defect or patent foramen ovale was identified. - Tricuspid valve: There was no significant regurgitation. - Pericardium, extracardiac: A mild pericardial effusion was identified. No tamponade.  Impressions: - No cardiac source of emboli was indentified.   CT ANGIOGRAPHY HEAD AND NECK CT PERFUSION BRAIN TECHNIQUE - 05/21/2016:  FINDINGS: CTA NECK FINDINGS Aortic arch: Minimal atherosclerotic disease in the aortic arch. Proximal great vessels tortuous but widely patent. Right carotid system: Right common carotid artery widely patent. Right internal carotid artery widely patent. Severe stenosis at the origin of the right external carotid artery Left carotid system: Left common carotid artery widely patent with mild atherosclerotic disease. Mild atherosclerotic calcification of the left carotid bifurcation without significant stenosis. Vertebral arteries: Both vertebral arteries patent without significant stenosis. Skeleton: Scoliosis. No acute skeletal abnormality. Minimal degenerative change in the cervical spine. Other neck: Multiple small thyroid nodules. No mass or adenopathy in the neck. Upper chest: Lung apices clear. Review of the MIP  images confirms the above findings   CTA HEAD FINDINGS  Anterior circulation: Mild atherosclerotic disease in the right cavernous carotid  artery without significant stenosis. Right anterior cerebral artery widely patent. Right M1 widely patent. Right middle cerebral artery branches widely patent without stenosis. Mild atherosclerotic calcification left cavernous carotid without stenosis. Left anterior cerebral artery widely patent. Left M1 segment is widely patent. Anterior branch of the left middle cerebral artery shows decreased density suggesting hypoperfusion. There appears to be a stenosis at the origin of this M3 branch of the left middle cerebral artery supplying the left frontotemporal lobe. Posterior circulation: Both vertebral arteries patent to the basilar. Basilar patent. PICA, superior cerebellar, posterior cerebral arteries patent without significant stenosis or occlusion. Venous sinuses: Patent Anatomic variants: None Delayed phase: Not performed Review of the MIP images confirms the above findings CT Brain Perfusion Findings: CBF (<30%) Volume: 0mL Perfusion (Tmax>6.0s) volume: 23mL Mismatch Volume: 23mL Infarction Location:Left frontotemporal lobe and left frontal operculum  IMPRESSION: 23 mL volume of hypoperfusion in the left frontotemporal lobe and left frontal operculum without fixed infarct by CT perfusion Hypoperfusion of the anterior branch of left MCA supplying the above area of infarction. This appears to be a left M3 branch which may have a severe stenosis. Left M1 is widely patent. No significant vertebral or carotid artery stenosis in the neck.  Ct Angio Neck W And/or Wo Contrast  05/21/2016 CTA NECK FINDINGS Aortic arch: Minimal atherosclerotic disease in the aortic arch. Proximal great vessels tortuous but widely patent. Right carotid system: Right common carotid artery widely patent. Right internal carotid artery widely patent. Severe stenosis at the origin of the right external carotid artery Left carotid system: Left common carotid artery widely patent with mild atherosclerotic disease. Mild atherosclerotic  calcification of the left carotid bifurcation without significant stenosis. Vertebral arteries: Both vertebral arteries patent without significant stenosis. Skeleton: Scoliosis. No acute skeletal abnormality. Minimal degenerative change in the cervical spine. Other neck: Multiple small thyroid nodules. No mass or adenopathy in the neck. Upper chest: Lung apices clear. Review of the MIP images confirms the above findings   CTA HEAD FINDINGS Anterior circulation: Mild atherosclerotic disease in the right cavernous carotid artery without significant stenosis. Right anterior cerebral artery widely patent. Right M1 widely patent. Right middle cerebral artery branches widely patent without stenosis. Mild atherosclerotic calcification left cavernous carotid without stenosis. Left anterior cerebral artery widely patent. Left M1 segment is widely patent. Anterior branch of the left middle cerebral artery shows decreased density suggesting hypoperfusion. There appears to be a stenosis at the origin of this M3 branch of the left middle cerebral artery supplying the left frontotemporal lobe. Posterior circulation: Both vertebral arteries patent to the basilar. Basilar patent. PICA, superior cerebellar, posterior cerebral arteries patent without significant stenosis or occlusion. Venous sinuses: Patent Anatomic variants: None Delayed phase: Not performed Review of the MIP images confirms the above findings   CT Brain Perfusion Findings: CBF (<30%) Volume: 0mL Perfusion (Tmax>6.0s) volume: 23mL Mismatch Volume: 23mL Infarction Location:Left frontotemporal lobe and left frontal operculum   IMPRESSION: 23 mL volume of hypoperfusion in the left frontotemporal lobe and left frontal operculum without fixed infarct by CT perfusion Hypoperfusion of the anterior branch of left MCA supplying the above area of infarction. This appears to be a left M3 branch which may have a severe stenosis. Left M1 is widely patent. No significant  vertebral or carotid artery stenosis in the neck.   Mr Brain Wo Contrast 05/21/2016 FINDINGS: Brain: There  is an acute left lateral lenticulostriate territory infarct extending from the body of the left caudate nucleus into the posterior left lentiform nucleus and external capsule region. There is no evidence of associated hemorrhage. No mass, midline shift, or extra-axial fluid collection is seen. There is mild cerebral atrophy. Patchy T2 hyperintensities throughout the subcortical and deep cerebral white matter bilaterally are nonspecific but compatible with moderate chronic small vessel ischemic disease. There is a chronic lacunar infarct in the left caudate. Vascular: Major intracranial vascular flow voids are preserved. Skull and upper cervical spine: Unremarkable bone marrow signal. Sinuses/Orbits: Unremarkable orbits.  No significant sinus disease. Other: None.  IMPRESSION:  1. Acute left basal ganglia infarct.  2. Moderate chronic small vessel ischemic disease. Ct Cerebral Perfusion W Contrast  CT ANGIOGRAPHY HEAD AND NECK CT PERFUSION BRAIN TECHNIQUE FINDINGS: CTA NECK FINDINGS 05/21/2016 Aortic arch: Minimal atherosclerotic disease in the aortic arch. Proximal great vessels tortuous but widely patent. Right carotid system: Right common carotid artery widely patent. Right internal carotid artery widely patent. Severe stenosis at the origin of the right external carotid artery Left carotid system: Left common carotid artery widely patent with mild atherosclerotic disease. Mild atherosclerotic calcification of the left carotid bifurcation without significant stenosis. Vertebral arteries: Both vertebral arteries patent without significant stenosis. Skeleton: Scoliosis. No acute skeletal abnormality. Minimal degenerative change in the cervical spine. Other neck: Multiple small thyroid nodules. No mass or adenopathy in the neck. Upper chest: Lung apices clear. Review of the MIP images confirms the above  findings   CTA HEAD FINDINGS Anterior circulation: Mild atherosclerotic disease in the right cavernous carotid artery without significant stenosis. Right anterior cerebral artery widely patent. Right M1 widely patent. Right middle cerebral artery branches widely patent without stenosis. Mild atherosclerotic calcification left cavernous carotid without stenosis. Left anterior cerebral artery widely patent. Left M1 segment is widely patent. Anterior branch of the left middle cerebral artery shows decreased density suggesting hypoperfusion. There appears to be a stenosis at the origin of this M3 branch of the left middle cerebral artery supplying the left frontotemporal lobe. Posterior circulation: Both vertebral arteries patent to the basilar. Basilar patent. PICA, superior cerebellar, posterior cerebral arteries patent without significant stenosis or occlusion. Venous sinuses: Patent Anatomic variants: None Delayed phase: Not performed Review of the MIP images confirms the above findings CT Brain Perfusion Findings: CBF (<30%) Volume: 0mL Perfusion (Tmax>6.0s) volume: 23mL Mismatch Volume: 23mL Infarction Location:Left frontotemporal lobe and left frontal operculum  IMPRESSION: 23 mL volume of hypoperfusion in the left frontotemporal lobe and left frontal operculum without fixed infarct by CT perfusion Hypoperfusion of the anterior branch of left MCA supplying the above area of infarction. This appears to be a left M3 branch which may have a severe stenosis. Left M1 is widely patent. No significant vertebral or carotid artery stenosis in the neck.   Recent Labs Lab 05/23/16 0511 05/24/16 0512 05/25/16 0404  WBC 6.3 4.1 8.8  HGB 13.1 12.3 12.5  HCT 37.1 36.0 36.5  PLT 258 243 254    Recent Labs Lab 05/21/16 0953 05/21/16 1006 05/22/16 0006 05/23/16 0511 05/24/16 0512 05/25/16 0404  NA 137 138 139 137 137 136  K 3.8 3.8 3.5 3.0* 4.0 3.4*  CL 101 101 105 105 109 104  CO2 27  --  GLUCOSE 254* 256* 183* 155* 220* 242*  BUN CREATININE 0.76 0.70 0.71 0.64 0.69 0.64  CALCIUM 9.3  --  9.0 8.7* 8.6* 8.9  ALKPHOS 51  --   --   --   --   --   AST 21  --   --   --   --   --   ALT 17  --   --   --   --   --   ALBUMIN 3.9  --   --   --   --   --    Lipid Panel     Component Value Date/Time   CHOL 168 05/22/2016 0006   TRIG 81 05/22/2016 0006   HDL 34 (L) 05/22/2016 0006   CHOLHDL 4.9 05/22/2016 0006   VLDL 16 05/22/2016 0006   LDLCALC 118 (H) 05/22/2016 0006       Results/Tests Pending at Time of Discharge: None  Discharge Medications:  Allergies as of 05/25/2016   No Known Allergies     Medication List    STOP taking these medications   methocarbamol 500 MG tablet Commonly known as:  ROBAXIN   metoprolol tartrate 25 MG tablet Commonly known as:  LOPRESSOR     TAKE these medications   aspirin 81 MG chewable tablet Chew 1 tablet (81 mg total) by mouth daily. Start taking on:  05/26/2016   atorvastatin 40 MG tablet Commonly known as:  LIPITOR Take 1 tablet (40 mg total) by mouth daily at 6 PM.   clopidogrel 75 MG tablet Commonly known as:  PLAVIX Take 1 tablet (75 mg total) by mouth daily. Start taking on:  05/26/2016   food thickener Powd Commonly known as:  THICK IT Take 1 g by mouth as needed (to thicken liquids).   gabapentin 300 MG capsule Commonly known as:  NEURONTIN Take 300 mg by mouth 2 (two) times daily.   hydrochlorothiazide 25 MG tablet Commonly known as:  HYDRODIURIL Take 1 tablet (25 mg total) by mouth daily. Start taking on:  05/26/2016   ibuprofen 200 MG tablet Commonly known as:  ADVIL,MOTRIN Take 200 mg by mouth every 6 (six) hours as needed for moderate pain.   insulin NPH Human 100 UNIT/ML injection Commonly known as:  HUMULIN N,NOVOLIN N Inject 0.05 mLs (5 Units total) into the skin 2 (two) times daily at 8 am and 10 pm.   lisinopril 20 MG tablet Commonly known as:  PRINIVIL,ZESTRIL Take 1 tablet  (20 mg total) by mouth daily. Start taking on:  05/26/2016 What changed:  medication strength  how much to take   metFORMIN 1000 MG tablet Commonly known as:  GLUCOPHAGE Take 1,000 mg by mouth 2 (two) times daily with a meal.   metoprolol succinate 50 MG 24 hr tablet Commonly known as:  TOPROL-XL Take 1 tablet (50 mg total) by mouth daily. Take with or immediately following a meal. Start taking on:  05/26/2016   multivitamin tablet Take 1 tablet by mouth daily.   sulfamethoxazole-trimethoprim 800-160 MG tablet Commonly known as:  BACTRIM DS,SEPTRA DS Take 1 tablet by mouth every 12 (twelve) hours.       Discharge Instructions: Please refer to Patient Instructions section of EMR for full details.  Patient was counseled important signs and symptoms that should prompt return to medical care, changes in medications, dietary instructions, activity restrictions, and follow up appointments.   Follow-Up Appointments: Follow-up Information    SETHI,PRAMOD, MD Follow up in 6 week(s).   Specialties:  Neurology, Radiology Why:  if still in the [place]. will be seen in the stroke clinic, office to call with appt  date and time. Contact information: [address] [place] [address] [phone]           Howard Pouch, MD 05/25/2016, 6:00 PM PGY-1, [facility]

## 2016-05-22 NOTE — Progress Notes (Signed)
STROKE TEAM PROGRESS NOTE   SUBJECTIVE (INTERVAL HISTORY) Paige Stewart, who is a traveling Engineer, civil (consulting), is at the bedside.  Pt was visiting  Paige Stewart from [place] when stroke occurred.    OBJECTIVE Temp:  [98.2 F (36.8 C)-99 F (37.2 C)] 98.2 F (36.8 C) (04/02 0112) Pulse Rate:  [80-96] 80 (04/02 0439) Cardiac Rhythm: Normal sinus rhythm (04/02 0800) Resp:  [12-23] 16 (04/02 0439) BP: (160-189)/(82-103) 189/103 (04/02 0800) SpO2:  [95 %-100 %] 100 % (04/02 0439) Weight:  [66.6 kg (146 lb 12.8 oz)] 66.6 kg (146 lb 12.8 oz) (04/02 0548)  CBC:  Recent Labs Lab 05/21/16 0953 05/21/16 1006 05/22/16 0006  WBC 5.1  --  4.7  NEUTROABS 3.6  --   --   HGB 12.6 13.3 12.9  HCT 37.3 39.0 36.6  MCV 86.9  --  86.5  PLT 233  --  232    Basic Metabolic Panel:  Recent Labs Lab 05/21/16 0953 05/21/16 1006 05/22/16 0006  NA 137 138 139  K 3.8 3.8 3.5  CL 101 101 105  CO2 27  --  26  GLUCOSE 254* 256* 183*  BUN CREATININE 0.76 0.70 0.71  CALCIUM 9.3  --  9.0   HgbA1c: No results found for: HGBA1C   PHYSICAL EXAM Pleasant middle age african lady not in distress. . Afebrile. Head is nontraumatic. Neck is supple without bruit.    Cardiac exam no murmur or gallop. Lungs are clear to auscultation. Distal pulses are well felt. Neurological Exam :  Awake alert. Significant expressive aphasia and will speak only a few words. Able to follow some midline and few simple commands but needs the questions to be repeated 2-3 times. Blinks to threat on the left but not the right. Right lower facial weakness. Tongue midline. Dense right hemiplegia with only trace withdrawal to painful stimuli in the legs. Tone is diminished on the right compared to the left. Reflexes are depressed on the right and normal on the left. Right plantar upgoing left downgoing. Gait was not tested. ASSESSMENT/PLAN Ms. Paige Stewart is a 66 y.o. female with history of HTN, diabetes and neuropathy presenting with  R sided weakness. She did not receive IV t-PA due to delay in arrival.   Stroke:  left basal ganglia infarct embolic secondary to unknown source  Resultant  R hemiparesis, R facial, likely some aphasia (decreased ability to follow commands in non-primary English speaker)  Code Stroke CT head no acute infarcts. Aspects 10. Chronic microvascular disease  CTA head L MCA hypoperfusion corresponding to infarct w/ severe L M3 stenosis.   CTA neck Unremarkable   CT P penumbra 3 mL left frontal love without fixed infarct   MRI  left basal ganglia infarct. Moderate small vessel ischemic disease.  2D Echo  pending   TEE to look for embolic source. Arranged with [facility] for tomorrow.  If positive for PFO (patent foramen ovale), check bilateral lower extremity venous dopplers to rule out DVT as possible source of stroke. (I have made patient NPO after midnight tonight and ordered TEE).   Will not consider further embolic workup with a loop for atrial fibrillation monitoring given planned return to [place]  LDL 118  HgbA1c pending  SCDs for VTE prophylaxis  Diet NPO time specified  No antithrombotic prior to admission, now on aspirin 300 mg suppository daily  Therapy recommendations:  Pending. BR currently ordered. Ok to be OOB from stroke standpoint  Disposition:  pending  (lives alone in [place], visiting family in [place], has emergency health insurance)  Hypertensive Urgency  BP as high as 224/124 on arrival in setting of neurologic symptoms  Elevated 180-190s during rounds  Permissive hypertension (OK if < 220/120) but gradually normalize in 5-7 days  Long-term BP goal normotensive  Hyperlipidemia  Home meds:  No statin  LDL 118 goal  Added statin  - lipitor 40 mg daily  Continue statin at discharge  Diabetes  HgbA1c goal < 7.0  Other Stroke Risk Factors  Advanced age  UDS not performed  Overweight, Body mass index is 29.65  kg/m.  Other Active Problems  Elevated troponin/demand ischemia with T-wave inversion. Cardiology consulted. Unsure if plaque rupture vs stroke sequelae. 1.91->3.5->4.13->3.94  CTA/C/P No aortic syndrome. Small to moderate pericardial effusion, mildly increased since 05/04/2016. Trace dependent bilateral pleural effusions. Three-vessel coronary atherosclerosis. Moderate RAS.  Small hiatal hernia.   Hospital day # 1  [name] See Amion for Pager information 05/22/2016 1:23 PM  I have personally examined this patient, reviewed notes, independently viewed imaging studies, participated in medical decision making and plan of care.ROS completed by me personally and pertinent positives fully documented  I have made any additions or clarifications directly to the above note. Agree with note above.  He presented with a wakeup stroke but unfortunately was not a candidate for IV tPA or mechanical embolectomy due to small perfusion defect and a very distal left M3 stenosis. I suspect she had a large M1 clot which embolized and went distally. I had a long discussion with the Stewart and discussed plan for evaluation, treatment and answered questions. Check swallow eval by speech therapy. Check echocardiogram including TEE. Will not consider loop recorder since patient lives in [place] and unsure how loop can be followed there. Start aspirin for stroke prevention for now. Greater than 50% time during this 35 minute visit was spent on counseling and coordination of care about Paige stroke, discussion of plan for evaluation, treatment and answered questions Delia Heady, MD Medical Director [facility] Pager: [phone] 05/22/2016 1:49 PM  To contact Stroke Continuity provider, please refer to [email or web address]. After hours, contact General Neurology

## 2016-05-22 NOTE — Progress Notes (Signed)
Family Medicine Teaching Service Daily Progress Note Intern Pager: [phone]  Patient name: Paige Stewart Medical record number: [id number] Date of birth: Jul 07, 1950 Age: 66 y.o. Gender: female  Primary Care Provider: No PCP Per Patient Consultants: Cardiology, Neurology  Code Status: Partial. Patient does not want to be intubated.   Chief Complaint: right sided weakness, right facial droop, and dysarthria.   Assessment and Plan: Paige Stewart is a 66 y.o. female presenting with right sided weakness, right facial droop, dysarthria. PMH is significant for DM2 ,HTN, diabetic neuropathy.   Acute basal ganglia infarct - with Right Sided Weakness, facial droop and dysarthria: CT head without hemorrhage, CT perfusion with small area of penumbra in the anterior MCA per neurology. CTA chest, abd, pelvis without dissection. MR brain with acute basal ganglia infarct. Risk factors: HTN, DM2  - neurology consulted and following, appreciate recommendations - Echo, TEE, SLP, no loop given patient going back to [place] - ASA  PR  - neuro checks and vitals per protocol - risk stratification labs: A1c pending and Lipid panel complete results below - PT/OT/Speech consult  - Atorvastatin 40 mg Qd - reportedly patient found down - will add on CK  Elevated Troponin, likely demand ischemia, improving: trend  istat 1.91> 3.4>3.5>4.13>3.94 . No chest pain or shortness of breath. EKG with T wave inversion in 1, aVL and V2 with mild ST elevation in V2. No prior history of MI. CT chest/abd/pelvis noted three vessel coronary atherosclerosis. - cardiology consulted, appreciate recommendations - consider outpatient NUC stress - ASA  - ECHO pending, TEE - IV metoprolol 2.5 mg q6H; consider increase when taking PO and after window for permissive hypertension  HTN: Elevated likely in the setting of stroke, unclear what baseline BP is. Home medications: lisinopril and metoprolol.  - vitals per protocol -  permissive HTN for the first 24 hours  - meto - may add PRN Hydralazine, will monitor first   DM2 with diabetic neuropathy: No a1c in chart. On Metformin at home. Patient not taking Gabapentin at home. Overnight CBG 174 - 198 - holding metformin - A1c - CBGs q 4 with sensitive SSI until pt can have a diet  Small to Moderate Pericardial Effusion: noted on CT mildly increased since 04/2016.  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   FEN/GI: NPO until swallow study  Prophylaxis: SCDs for now. Will re-consider in 24 hours   Disposition: admit to FPTS telemetry for stroke and elevated troponin work up  Subjective:  This morning Paige Stewart was not very cooperative for my neurologic exam, initial concern for repeat ischemic event because she refused cranial nerve exam, refused strength exam at first. However upon further history obtained from her daughter at bedside, patient has been having intermittent episodes of flat affect and depressed mood in the setting of having to go back to [place] at the end of the month.  Neurologic exam is unchanged from yesterday.  Objective: Temp:  [98.2 F (36.8 C)-99 F (37.2 C)] 98.9 F (37.2 C) (04/02 1144) Pulse Rate:  [80-92] 82 (04/02 1144) Resp:  [12-21] 21 (04/02 1144) BP: (160-189)/(82-103) 189/98 (04/02 1144) SpO2:  [97 %-100 %] 99 % (04/02 1144) Weight:  [66.6 kg (146 lb 12.8 oz)] 66.6 kg (146 lb 12.8 oz) (04/02 0548) Physical Exam: General: NAD, comfortable appearing, nontoxic-appearing Cardiovascular: RRR, no m/r/g Respiratory: CTA bil, no W/R/R Abdomen: soft and nontender, nondistended Extremities:  no LE edema or calf tenderness Neuro: +Dysarthria, Cn 2-4 intact. Decreased movement of right side of face. CN 6 intact. CN 8-12 normal except right shoulder shrug decreased.  R arm and leg weakness (1/5), R arm and R leg strength  normal. Patient refuses to answer for sensation exam  Laboratory:  Important labs this admission  Lipid Panel     Component Value Date/Time   CHOL 168 05/22/2016 0006   TRIG 81 05/22/2016 0006   HDL 34 (L) 05/22/2016 0006   CHOLHDL 4.9 05/22/2016 0006   VLDL 16 05/22/2016 0006   LDLCALC 118 (H) 05/22/2016 0006      Recent Labs Lab 05/21/16 0953 05/21/16 1006 05/22/16 0006  WBC 5.1  --  4.7  HGB 12.6 13.3 12.9  HCT 37.3 39.0 36.6  PLT 233  --  232    Recent Labs Lab 05/21/16 0953 05/21/16 1006 05/22/16 0006  NA 137 138 139  K 3.8 3.8 3.5  CL 101 101 105  CO2 27  --  26  BUN CREATININE 0.76 0.70 0.71  CALCIUM 9.3  --  9.0  PROT 7.2  --   --   BILITOT 0.4  --   --   ALKPHOS 51  --   --   ALT 17  --   --   AST 21  --   --   GLUCOSE 254* 256* 183*      Imaging/Diagnostic Tests: EKG with T wave inversion in 1, aVL and V2 with mild ST elevation in V2.  CT ANGIOGRAPHY HEAD AND NECK CT PERFUSION BRAIN TECHNIQUE - 05/21/2016:  FINDINGS: CTA NECK FINDINGS Aortic arch: Minimal atherosclerotic disease in the aortic arch. Proximal great vessels tortuous but widely patent. Right carotid system: Right common carotid artery widely patent. Right internal carotid artery widely patent. Severe stenosis at the origin of the right external carotid artery Left carotid system: Left common carotid artery widely patent with mild atherosclerotic disease. Mild atherosclerotic calcification of the left carotid bifurcation without significant stenosis. Vertebral arteries: Both vertebral arteries patent without significant stenosis. Skeleton: Scoliosis. No acute skeletal abnormality. Minimal degenerative change in the cervical spine. Other neck: Multiple small thyroid nodules. No mass or adenopathy in the neck. Upper chest: Lung apices clear. Review of the MIP images confirms the above findings   CTA HEAD FINDINGS  Anterior circulation: Mild atherosclerotic disease in the right  cavernous carotid artery without significant stenosis. Right anterior cerebral artery widely patent. Right M1 widely patent. Right middle cerebral artery branches widely patent without stenosis. Mild atherosclerotic calcification left cavernous carotid without stenosis. Left anterior cerebral artery widely patent. Left M1 segment is widely patent. Anterior branch of the left middle cerebral artery shows decreased density suggesting hypoperfusion. There appears to be a stenosis at the origin of this M3 branch of the left middle cerebral artery supplying the left frontotemporal lobe. Posterior circulation: Both vertebral arteries patent to the basilar. Basilar patent. PICA, superior cerebellar, posterior cerebral arteries patent without significant stenosis or occlusion. Venous sinuses: Patent Anatomic variants: None Delayed phase: Not performed Review of the MIP images confirms the above findings CT Brain Perfusion Findings: CBF (<30%) Volume: 0mL Perfusion (Tmax>6.0s) volume: 23mL Mismatch Volume: 23mL Infarction Location:Left frontotemporal lobe and left frontal operculum  IMPRESSION: 23 mL volume of hypoperfusion in the left frontotemporal lobe and left frontal operculum without fixed infarct by CT perfusion Hypoperfusion of the anterior branch of left MCA supplying the above area of infarction. This appears  to be a left M3 branch which may have a severe stenosis. Left M1 is widely patent. No significant vertebral or carotid artery stenosis in the neck.  Ct Angio Neck W And/or Wo Contrast  05/21/2016 CTA NECK FINDINGS Aortic arch: Minimal atherosclerotic disease in the aortic arch. Proximal great vessels tortuous but widely patent. Right carotid system: Right common carotid artery widely patent. Right internal carotid artery widely patent. Severe stenosis at the origin of the right external carotid artery Left carotid system: Left common carotid artery widely patent with mild atherosclerotic disease. Mild  atherosclerotic calcification of the left carotid bifurcation without significant stenosis. Vertebral arteries: Both vertebral arteries patent without significant stenosis. Skeleton: Scoliosis. No acute skeletal abnormality. Minimal degenerative change in the cervical spine. Other neck: Multiple small thyroid nodules. No mass or adenopathy in the neck. Upper chest: Lung apices clear. Review of the MIP images confirms the above findings   CTA HEAD FINDINGS Anterior circulation: Mild atherosclerotic disease in the right cavernous carotid artery without significant stenosis. Right anterior cerebral artery widely patent. Right M1 widely patent. Right middle cerebral artery branches widely patent without stenosis. Mild atherosclerotic calcification left cavernous carotid without stenosis. Left anterior cerebral artery widely patent. Left M1 segment is widely patent. Anterior branch of the left middle cerebral artery shows decreased density suggesting hypoperfusion. There appears to be a stenosis at the origin of this M3 branch of the left middle cerebral artery supplying the left frontotemporal lobe. Posterior circulation: Both vertebral arteries patent to the basilar. Basilar patent. PICA, superior cerebellar, posterior cerebral arteries patent without significant stenosis or occlusion. Venous sinuses: Patent Anatomic variants: None Delayed phase: Not performed Review of the MIP images confirms the above findings   CT Brain Perfusion Findings: CBF (<30%) Volume: 0mL Perfusion (Tmax>6.0s) volume: 23mL Mismatch Volume: 23mL Infarction Location:Left frontotemporal lobe and left frontal operculum   IMPRESSION: 23 mL volume of hypoperfusion in the left frontotemporal lobe and left frontal operculum without fixed infarct by CT perfusion Hypoperfusion of the anterior branch of left MCA supplying the above area of infarction. This appears to be a left M3 branch which may have a severe stenosis. Left M1 is widely patent.  No significant vertebral or carotid artery stenosis in the neck.   Mr Brain Wo Contrast 05/21/2016 FINDINGS: Brain: There is an acute left lateral lenticulostriate territory infarct extending from the body of the left caudate nucleus into the posterior left lentiform nucleus and external capsule region. There is no evidence of associated hemorrhage. No mass, midline shift, or extra-axial fluid collection is seen. There is mild cerebral atrophy. Patchy T2 hyperintensities throughout the subcortical and deep cerebral white matter bilaterally are nonspecific but compatible with moderate chronic small vessel ischemic disease. There is a chronic lacunar infarct in the left caudate. Vascular: Major intracranial vascular flow voids are preserved. Skull and upper cervical spine: Unremarkable bone marrow signal. Sinuses/Orbits: Unremarkable orbits.  No significant sinus disease. Other: None.  IMPRESSION:  1. Acute left basal ganglia infarct.  2. Moderate chronic small vessel ischemic disease. Ct Cerebral Perfusion W Contrast   CT ANGIOGRAPHY HEAD AND NECK CT PERFUSION BRAIN TECHNIQUE FINDINGS: CTA NECK FINDINGS 05/21/2016 Aortic arch: Minimal atherosclerotic disease in the aortic arch. Proximal great vessels tortuous but widely patent. Right carotid system: Right common carotid artery widely patent. Right internal carotid artery widely patent. Severe stenosis at the origin of the right external carotid artery Left carotid system: Left common carotid artery widely patent with mild atherosclerotic disease. Mild atherosclerotic calcification  of the left carotid bifurcation without significant stenosis. Vertebral arteries: Both vertebral arteries patent without significant stenosis. Skeleton: Scoliosis. No acute skeletal abnormality. Minimal degenerative change in the cervical spine. Other neck: Multiple small thyroid nodules. No mass or adenopathy in the neck. Upper chest: Lung apices clear. Review of the MIP images  confirms the above findings   CTA HEAD FINDINGS Anterior circulation: Mild atherosclerotic disease in the right cavernous carotid artery without significant stenosis. Right anterior cerebral artery widely patent. Right M1 widely patent. Right middle cerebral artery branches widely patent without stenosis. Mild atherosclerotic calcification left cavernous carotid without stenosis. Left anterior cerebral artery widely patent. Left M1 segment is widely patent. Anterior branch of the left middle cerebral artery shows decreased density suggesting hypoperfusion. There appears to be a stenosis at the origin of this M3 branch of the left middle cerebral artery supplying the left frontotemporal lobe. Posterior circulation: Both vertebral arteries patent to the basilar. Basilar patent. PICA, superior cerebellar, posterior cerebral arteries patent without significant stenosis or occlusion. Venous sinuses: Patent Anatomic variants: None Delayed phase: Not performed Review of the MIP images confirms the above findings CT Brain Perfusion Findings: CBF (<30%) Volume: 0mL Perfusion (Tmax>6.0s) volume: 23mL Mismatch Volume: 23mL Infarction Location:Left frontotemporal lobe and left frontal operculum  IMPRESSION: 23 mL volume of hypoperfusion in the left frontotemporal lobe and left frontal operculum without fixed infarct by CT perfusion Hypoperfusion of the anterior branch of left MCA supplying the above area of infarction. This appears to be a left M3 branch which may have a severe stenosis. Left M1 is widely patent. No significant vertebral or carotid artery stenosis in the neck.   Howard Pouch, MD 05/22/2016, 2:22 PM PGY-1, [facility] Family Medicine FPTS Intern pager: [phone], text pages welcome

## 2016-05-22 NOTE — Progress Notes (Addendum)
    [facility] has been requested to perform a transesophageal echocardiogram on Paige Stewart for Congestive Heart Failure.  After careful review of history and examination, the risks and benefits of transesophageal echocardiogram have been explained including risks of esophageal damage, perforation (1:10,000 risk), bleeding, pharyngeal hematoma as well as other potential complications associated with conscious sedation including aspiration, arrhythmia, respiratory failure and death. Alternatives to treatment were discussed, questions were answered. Patient is willing to proceed.   To be done by Dr. Delton See at 11 am on 05/23/2016   Dorthula Matas, PA-C  05/22/2016 3:47 PM

## 2016-05-22 NOTE — Progress Notes (Signed)
Clarified with Dr. Pearlean Brownie that it is ok to do MBS today. Emelda Brothers RN

## 2016-05-23 ENCOUNTER — Inpatient Hospital Stay (HOSPITAL_COMMUNITY): Payer: No Typology Code available for payment source

## 2016-05-23 ENCOUNTER — Encounter (HOSPITAL_COMMUNITY): Payer: Self-pay | Admitting: *Deleted

## 2016-05-23 ENCOUNTER — Encounter (HOSPITAL_COMMUNITY)
Admission: EM | Disposition: A | Discharge status: Rehabilitation Facility | Payer: Self-pay | Source: Home / Self Care | Attending: Family Medicine

## 2016-05-23 DIAGNOSIS — I6789 Other cerebrovascular disease: Secondary | ICD-10-CM

## 2016-05-23 DIAGNOSIS — I638 Other cerebral infarction: Secondary | ICD-10-CM

## 2016-05-23 DIAGNOSIS — I313 Pericardial effusion (noninflammatory): Secondary | ICD-10-CM

## 2016-05-23 HISTORY — PX: TEE WITHOUT CARDIOVERSION: SHX5443

## 2016-05-23 LAB — BASIC METABOLIC PANEL
Anion gap: 9 (ref 5–15)
BUN: 8 mg/dL (ref 6–20)
CO2: 23 mmol/L (ref 22–32)
CREATININE: 0.64 mg/dL (ref 0.44–1.00)
Calcium: 8.7 mg/dL — ABNORMAL LOW (ref 8.9–10.3)
Chloride: 105 mmol/L (ref 101–111)
GFR calc Af Amer: >60 mL/min (ref >60)
GLUCOSE: 155 mg/dL — AB (ref 65–99)
POTASSIUM: 3 mmol/L — AB (ref 3.5–5.1)
SODIUM: 137 mmol/L (ref 135–145)

## 2016-05-23 LAB — GLUCOSE, CAPILLARY
GLUCOSE-CAPILLARY: 182 mg/dL — AB (ref 65–99)
GLUCOSE-CAPILLARY: 169 mg/dL — AB (ref 65–99)
GLUCOSE-CAPILLARY: 197 mg/dL — AB (ref 65–99)
GLUCOSE-CAPILLARY: 177 mg/dL — AB (ref 65–99)
Glucose-Capillary: 156 mg/dL — ABNORMAL HIGH (ref 65–99)
Glucose-Capillary: 219 mg/dL — ABNORMAL HIGH (ref 65–99)

## 2016-05-23 LAB — ECHOCARDIOGRAM LIMITED BUBBLE STUDY: Weight: 2344 oz

## 2016-05-23 LAB — CBC
HEMATOCRIT: 37.1 % (ref 36.0–46.0)
Hemoglobin: 13.1 g/dL (ref 12.0–15.0)
MCH: 30.5 pg (ref 26.0–34.0)
MCHC: 35.3 g/dL (ref 30.0–36.0)
MCV: 86.3 fL (ref 78.0–100.0)
PLATELETS: 258 10*3/uL (ref 150–400)
RBC: 4.3 MIL/uL (ref 3.87–5.11)
RDW: 13.4 % (ref 11.5–15.5)
WBC: 6.3 10*3/uL (ref 4.0–10.5)

## 2016-05-23 LAB — HEMOGLOBIN A1C
Hgb A1c MFr Bld: 10.3 % — ABNORMAL HIGH (ref 4.8–5.6)
Mean Plasma Glucose: 249 mg/dL

## 2016-05-23 SURGERY — ECHOCARDIOGRAM, TRANSESOPHAGEAL
Anesthesia: Moderate Sedation

## 2016-05-23 MED ORDER — ASPIRIN 81 MG PO CHEW
81.0000 mg | CHEWABLE_TABLET | Freq: Every day | ORAL | Status: DC
  Administered 2016-05-24 – 2016-05-25 (×2): 81 mg via ORAL
  Filled 2016-05-23 (×2): qty 1

## 2016-05-23 MED ORDER — LISINOPRIL 10 MG PO TABS
10.0000 mg | ORAL_TABLET | Freq: Every day | ORAL | Status: DC
  Administered 2016-05-23 – 2016-05-24 (×2): 10 mg via ORAL
  Filled 2016-05-23 (×2): qty 1

## 2016-05-23 MED ORDER — BUTAMBEN-TETRACAINE-BENZOCAINE 2-2-14 % EX AERO
INHALATION_SPRAY | CUTANEOUS | Status: DC | PRN
Start: 2016-05-23 — End: 2016-05-23
  Administered 2016-05-23: 2 via TOPICAL

## 2016-05-23 MED ORDER — FENTANYL CITRATE (PF) 100 MCG/2ML IJ SOLN
INTRAMUSCULAR | Status: AC
  Filled 2016-05-23: qty 2

## 2016-05-23 MED ORDER — MIDAZOLAM HCL 5 MG/ML IJ SOLN
INTRAMUSCULAR | Status: AC
  Filled 2016-05-23: qty 2

## 2016-05-23 MED ORDER — MIDAZOLAM HCL 10 MG/2ML IJ SOLN
INTRAMUSCULAR | Status: DC | PRN
Start: 2016-05-23 — End: 2016-05-23
  Administered 2016-05-23: 2 mg via INTRAVENOUS

## 2016-05-23 MED ORDER — HYDRALAZINE HCL 20 MG/ML IJ SOLN: 5.0000 mg | Freq: Four times a day (QID) | INTRAMUSCULAR | Status: DC | PRN

## 2016-05-23 MED ORDER — CEPHALEXIN 125 MG/5ML PO SUSR
500.0000 mg | Freq: Two times a day (BID) | ORAL | Status: DC
  Administered 2016-05-23 – 2016-05-24 (×3): 500 mg via ORAL
  Filled 2016-05-23 (×3): qty 20

## 2016-05-23 MED ORDER — STARCH (THICKENING) PO POWD
ORAL | Status: DC | PRN
  Filled 2016-05-23: qty 227

## 2016-05-23 MED ORDER — POTASSIUM CHLORIDE CRYS ER 20 MEQ PO TBCR
40.0000 meq | EXTENDED_RELEASE_TABLET | Freq: Two times a day (BID) | ORAL | Status: AC
  Administered 2016-05-23 (×2): 40 meq via ORAL
  Filled 2016-05-23 (×2): qty 2

## 2016-05-23 MED ORDER — ASPIRIN 81 MG PO CHEW: 81.0000 mg | CHEWABLE_TABLET | Freq: Every day | ORAL | Status: DC

## 2016-05-23 MED ORDER — HYDROCHLOROTHIAZIDE 12.5 MG PO CAPS
12.5000 mg | ORAL_CAPSULE | Freq: Every day | ORAL | Status: DC
  Administered 2016-05-23 – 2016-05-24 (×2): 12.5 mg via ORAL
  Filled 2016-05-23 (×2): qty 1

## 2016-05-23 MED ORDER — FENTANYL CITRATE (PF) 100 MCG/2ML IJ SOLN
INTRAMUSCULAR | Status: DC | PRN
  Administered 2016-05-23: 25 ug via INTRAVENOUS

## 2016-05-23 MED ORDER — INSULIN ASPART 100 UNIT/ML ~~LOC~~ SOLN
0.0000 [IU] | Freq: Three times a day (TID) | SUBCUTANEOUS | Status: DC
  Administered 2016-05-23: 2 [IU] via SUBCUTANEOUS
  Administered 2016-05-24: 3 [IU] via SUBCUTANEOUS
  Administered 2016-05-24: 2 [IU] via SUBCUTANEOUS
  Administered 2016-05-25 (×2): 3 [IU] via SUBCUTANEOUS
  Administered 2016-05-25: 5 [IU] via SUBCUTANEOUS

## 2016-05-23 MED ORDER — METOPROLOL TARTRATE 12.5 MG HALF TABLET
12.5000 mg | ORAL_TABLET | Freq: Two times a day (BID) | ORAL | Status: DC
  Administered 2016-05-23 (×2): 12.5 mg via ORAL
  Filled 2016-05-23 (×2): qty 1

## 2016-05-23 NOTE — Progress Notes (Signed)
  Speech Language Pathology Treatment: Dysphagia  Patient Details Name: Paige Stewart MRN: [id number] DOB: 11-Aug-1950 Today's Date: 05/23/2016 Time: 4098-1191 SLP Time Calculation (min) (ACUTE ONLY): 12 min  Assessment / Plan / Recommendation Clinical Impression  F/u for dysphagia after yesterday's MBS - pt with difficulty tolerating nectar-thick liquids this afternoon, possibly with decreased function due to drowsiness after TEE, but with consistent coughing s/p swallows, concerning for penetration/aspiration.  Recommend changing liquid consistency from nectars to honey; adequate toleration of meds in puree.  Pt continues to require moderate cueing to attend to left oral cavity to prevent spillage, decrease pocketing.  SLP will follow for safety/strengthening.  D/W RN.   HPI HPI: Paige Stewart a 66 y.o.female, visiting her daughter from [place] with right sided weakness, right facial droop, dysarthria. PMH is significant for DM2 ,HTN, diabetic neuropathy. CT head without hemorrhage, CT perfusion with small area of penumbra in the anterior MCA per neurology. MRI 05/21/16 showed acute left basal ganglia infarct, moderate chronic small vessel ischemic disease. No prior swallowing evaluations in chart. Failed nursing stroke swallow screen.      SLP Plan  Continue with current plan of care  Patient needs continued Speech Lanaguage Pathology Services    Recommendations  Diet recommendations: Dysphagia 1 (puree);Honey-thick liquid Liquids provided via: Cup Medication Administration: Whole meds with puree Supervision: Full supervision/cueing for compensatory strategies Compensations: Minimize environmental distractions;Slow rate;Small sips/bites                Oral Care Recommendations: Oral care BID SLP Visit Diagnosis: Dysphagia, unspecified (R13.10) Plan: Continue with current plan of care       GO                Paige Stewart Paige Stewart 05/23/2016, 2:33  PM

## 2016-05-23 NOTE — CV Procedure (Addendum)
     Transesophageal Echocardiogram Note  Paige Stewart [id number] 1950-10-06  Procedure: Transesophageal Echocardiogram Indications: CVA  Procedure Details Consent: Obtained Time Out: Verified patient identification, verified procedure, site/side was marked, verified correct patient position, special equipment/implants available, Radiology Safety Procedures followed,  medications/allergies/relevent history reviewed, required imaging and test results available.  Performed  Medications: Fentanyl: 25 mcg Versed: 4 mg  During this procedure the patient is administered a total of Fentanyl: 25 mcg, Versed: 4 mg, to achieve and maintain moderate conscious sedation.  The patient's heart rate, blood pressure, and oxygen saturation are monitored continuously during the procedure. The period of conscious sedation is 35 minutes, of which I was present face-to-face 100% of this time.  No intracardiac source of embolism was identified. Full report to follow.   Complications: No apparent complications Patient did tolerate procedure well.  Tobias Alexander, MD, [facility] 05/23/2016, 12:23 PM

## 2016-05-23 NOTE — Progress Notes (Signed)
  Echocardiogram 2D Echocardiogram limited with bubble study has been performed.  Paige Stewart 05/23/2016, 11:57 AM

## 2016-05-23 NOTE — Progress Notes (Addendum)
 Progress Note  Patient Name: Paige Stewart Date of Encounter: 05/23/2016  Primary Cardiologist: Chairty Toman  Subjective   No CP, no SOB. Her daughter said she was able to say a few words. No change in movement.   Inpatient Medications    Scheduled Meds: . aspirin  300 mg Rectal Daily  . atorvastatin  40 mg Oral q1800  . insulin aspart  0-9 Units Subcutaneous Q4H  . metoprolol  2.5 mg Intravenous Q6H   Continuous Infusions: . sodium chloride 100 mL/hr at 05/23/16 0013   PRN Meds: acetaminophen **OR** acetaminophen (TYLENOL) oral liquid 160 mg/5 mL **OR** acetaminophen, hydrALAZINE, senna-docusate   Vital Signs    Vitals:   05/23/16 0329 05/23/16 0409 05/23/16 0735 05/23/16 0804  BP: (!) 147/91  (!) 189/103 (!) 188/113  Pulse: (!) 102  89 93  Resp: 20  20 16  Temp: 97.8 F (36.6 C)  98.1 F (36.7 C) 98 F (36.7 C)  TempSrc: Axillary  Axillary Axillary  SpO2: 96%  96% 98%  Weight:  146 lb 8 oz (66.5 kg)      Intake/Output Summary (Last 24 hours) at 05/23/16 0916 Last data filed at 05/23/16 0805  Gross per 24 hour  Intake             2000 ml  Output                0 ml  Net             2000 ml   Filed Weights   05/21/16 0951 05/22/16 0548 05/23/16 0409  Weight: 148 lb (67.1 kg) 146 lb 12.8 oz (66.6 kg) 146 lb 8 oz (66.5 kg)    Telemetry    No adverse rhythms unchanged- Personally Reviewed  ECG    NSR with TWI noted - Personally Reviewed  Physical Exam   GEN: No acute distress.  Laying in bed Neck: No JVD Cardiac: RRR, no murmurs, rubs, or gallops.  Respiratory: Clear to auscultation bilaterally. GI: Soft, nontender, non-distended obese MS: No edema; No deformity. Neuro:  Still with aphasia, right weakness  Psych: Normal affect   Labs    Chemistry  Recent Labs Lab 05/21/16 0953 05/21/16 1006 05/22/16 0006 05/23/16 0511  NA 137 138 139 137  K 3.8 3.8 3.5 3.0*  CL 101 101 105 105  CO2 27  --  26 23  GLUCOSE 254* 256* 183* 155*  BUN 14 17  11 8  CREATININE 0.76 0.70 0.71 0.64  CALCIUM 9.3  --  9.0 8.7*  PROT 7.2  --   --   --   ALBUMIN 3.9  --   --   --   AST 21  --   --   --   ALT 17  --   --   --   ALKPHOS 51  --   --   --   BILITOT 0.4  --   --   --   GFRNONAA >60  --  >60 >60  GFRAA >60  --  >60 >60  ANIONGAP 9  --  8 9     Hematology  Recent Labs Lab 05/21/16 0953 05/21/16 1006 05/22/16 0006 05/23/16 0511  WBC 5.1  --  4.7 6.3  RBC 4.29  --  4.23 4.30  HGB 12.6 13.3 12.9 13.1  HCT 37.3 39.0 36.6 37.1  MCV 86.9  --  86.5 86.3  MCH 29.4  --  30.5 30.5  MCHC 33.8  --    35.2 35.3  RDW 13.2  --  13.4 13.4  PLT 233  --  232 258    Cardiac Enzymes  Recent Labs Lab 05/22/16 0006 05/22/16 0604 05/22/16 1235 05/22/16 1752  TROPONINI 4.13* 3.94* 4.12* 4.00*     Recent Labs Lab 05/21/16 1000  TROPIPOC 1.91*     BNPNo results for input(s): BNP, PROBNP in the last 168 hours.   DDimer No results for input(s): DDIMER in the last 168 hours.   Radiology    Ct Angio Head W Or Wo Contrast  Result Date: 05/21/2016 CLINICAL DATA:  Right facial and arm and leg weakness. EXAM: CT ANGIOGRAPHY HEAD AND NECK CT PERFUSION BRAIN TECHNIQUE: Multidetector CT imaging of the head and neck was performed using the standard protocol during bolus administration of intravenous contrast. Multiplanar CT image reconstructions and MIPs were obtained to evaluate the vascular anatomy. Carotid stenosis measurements (when applicable) are obtained utilizing NASCET criteria, using the distal internal carotid diameter as the denominator. Multiphase CT imaging of the brain was performed following IV bolus contrast injection. Subsequent parametric perfusion maps were calculated using RAPID software. CONTRAST:  100 mL Isovue 370 IV COMPARISON:  CT head 05/21/2016 FINDINGS: CTA NECK FINDINGS Aortic arch: Minimal atherosclerotic disease in the aortic arch. Proximal great vessels tortuous but widely patent. Right carotid system: Right common  carotid artery widely patent. Right internal carotid artery widely patent. Severe stenosis at the origin of the right external carotid artery Left carotid system: Left common carotid artery widely patent with mild atherosclerotic disease. Mild atherosclerotic calcification of the left carotid bifurcation without significant stenosis. Vertebral arteries: Both vertebral arteries patent without significant stenosis. Skeleton: Scoliosis. No acute skeletal abnormality. Minimal degenerative change in the cervical spine. Other neck: Multiple small thyroid nodules. No mass or adenopathy in the neck. Upper chest: Lung apices clear. Review of the MIP images confirms the above findings CTA HEAD FINDINGS Anterior circulation: Mild atherosclerotic disease in the right cavernous carotid artery without significant stenosis. Right anterior cerebral artery widely patent. Right M1 widely patent. Right middle cerebral artery branches widely patent without stenosis. Mild atherosclerotic calcification left cavernous carotid without stenosis. Left anterior cerebral artery widely patent. Left M1 segment is widely patent. Anterior branch of the left middle cerebral artery shows decreased density suggesting hypoperfusion. There appears to be a stenosis at the origin of this M3 branch of the left middle cerebral artery supplying the left frontotemporal lobe. Posterior circulation: Both vertebral arteries patent to the basilar. Basilar patent. PICA, superior cerebellar, posterior cerebral arteries patent without significant stenosis or occlusion. Venous sinuses: Patent Anatomic variants: None Delayed phase: Not performed Review of the MIP images confirms the above findings CT Brain Perfusion Findings: CBF (<30%) Volume: 0mL Perfusion (Tmax>6.0s) volume: 23mL Mismatch Volume: 23mL Infarction Location:Left frontotemporal lobe and left frontal operculum IMPRESSION: 23 mL volume of hypoperfusion in the left frontotemporal lobe and left frontal  operculum without fixed infarct by CT perfusion Hypoperfusion of the anterior branch of left MCA supplying the above area of infarction. This appears to be a left M3 branch which may have a severe stenosis. Left M1 is widely patent. No significant vertebral or carotid artery stenosis in the neck. The images were reviewed with Dr. Kirkpatrick at 1030 hours on 05/21/2016 Electronically Signed   By: Charles  Clark M.D.   On: 05/21/2016 10:53   Ct Angio Neck W And/or Wo Contrast  Result Date: 05/21/2016 CLINICAL DATA:  Right facial and arm and leg weakness. EXAM: CT ANGIOGRAPHY HEAD   AND NECK CT PERFUSION BRAIN TECHNIQUE: Multidetector CT imaging of the head and neck was performed using the standard protocol during bolus administration of intravenous contrast. Multiplanar CT image reconstructions and MIPs were obtained to evaluate the vascular anatomy. Carotid stenosis measurements (when applicable) are obtained utilizing NASCET criteria, using the distal internal carotid diameter as the denominator. Multiphase CT imaging of the brain was performed following IV bolus contrast injection. Subsequent parametric perfusion maps were calculated using RAPID software. CONTRAST:  100 mL Isovue 370 IV COMPARISON:  CT head 05/21/2016 FINDINGS: CTA NECK FINDINGS Aortic arch: Minimal atherosclerotic disease in the aortic arch. Proximal great vessels tortuous but widely patent. Right carotid system: Right common carotid artery widely patent. Right internal carotid artery widely patent. Severe stenosis at the origin of the right external carotid artery Left carotid system: Left common carotid artery widely patent with mild atherosclerotic disease. Mild atherosclerotic calcification of the left carotid bifurcation without significant stenosis. Vertebral arteries: Both vertebral arteries patent without significant stenosis. Skeleton: Scoliosis. No acute skeletal abnormality. Minimal degenerative change in the cervical spine. Other  neck: Multiple small thyroid nodules. No mass or adenopathy in the neck. Upper chest: Lung apices clear. Review of the MIP images confirms the above findings CTA HEAD FINDINGS Anterior circulation: Mild atherosclerotic disease in the right cavernous carotid artery without significant stenosis. Right anterior cerebral artery widely patent. Right M1 widely patent. Right middle cerebral artery branches widely patent without stenosis. Mild atherosclerotic calcification left cavernous carotid without stenosis. Left anterior cerebral artery widely patent. Left M1 segment is widely patent. Anterior branch of the left middle cerebral artery shows decreased density suggesting hypoperfusion. There appears to be a stenosis at the origin of this M3 branch of the left middle cerebral artery supplying the left frontotemporal lobe. Posterior circulation: Both vertebral arteries patent to the basilar. Basilar patent. PICA, superior cerebellar, posterior cerebral arteries patent without significant stenosis or occlusion. Venous sinuses: Patent Anatomic variants: None Delayed phase: Not performed Review of the MIP images confirms the above findings CT Brain Perfusion Findings: CBF (<30%) Volume: 0mL Perfusion (Tmax>6.0s) volume: 23mL Mismatch Volume: 23mL Infarction Location:Left frontotemporal lobe and left frontal operculum IMPRESSION: 23 mL volume of hypoperfusion in the left frontotemporal lobe and left frontal operculum without fixed infarct by CT perfusion Hypoperfusion of the anterior branch of left MCA supplying the above area of infarction. This appears to be a left M3 branch which may have a severe stenosis. Left M1 is widely patent. No significant vertebral or carotid artery stenosis in the neck. The images were reviewed with Dr. Kirkpatrick at 1030 hours on 05/21/2016 Electronically Signed   By: Charles  Clark M.D.   On: 05/21/2016 10:53   Mr Brain Wo Contrast  Result Date: 05/21/2016 CLINICAL DATA:  Right-sided  weakness. EXAM: MRI HEAD WITHOUT CONTRAST TECHNIQUE: Multiplanar, multiecho pulse sequences of the brain and surrounding structures were obtained without intravenous contrast. COMPARISON:  Head CT/CTA/ cerebral perfusion 05/21/2016 FINDINGS: Brain: There is an acute left lateral lenticulostriate territory infarct extending from the body of the left caudate nucleus into the posterior left lentiform nucleus and external capsule region. There is no evidence of associated hemorrhage. No mass, midline shift, or extra-axial fluid collection is seen. There is mild cerebral atrophy. Patchy T2 hyperintensities throughout the subcortical and deep cerebral white matter bilaterally are nonspecific but compatible with moderate chronic small vessel ischemic disease. There is a chronic lacunar infarct in the left caudate. Vascular: Major intracranial vascular flow voids are preserved. Skull and upper   cervical spine: Unremarkable bone marrow signal. Sinuses/Orbits: Unremarkable orbits.  No significant sinus disease. Other: None. IMPRESSION: 1. Acute left basal ganglia infarct. 2. Moderate chronic small vessel ischemic disease. Electronically Signed   By: Allen  Grady M.D.   On: 05/21/2016 14:20   Ct Cerebral Perfusion W Contrast  Result Date: 05/21/2016 CLINICAL DATA:  Right facial and arm and leg weakness. EXAM: CT ANGIOGRAPHY HEAD AND NECK CT PERFUSION BRAIN TECHNIQUE: Multidetector CT imaging of the head and neck was performed using the standard protocol during bolus administration of intravenous contrast. Multiplanar CT image reconstructions and MIPs were obtained to evaluate the vascular anatomy. Carotid stenosis measurements (when applicable) are obtained utilizing NASCET criteria, using the distal internal carotid diameter as the denominator. Multiphase CT imaging of the brain was performed following IV bolus contrast injection. Subsequent parametric perfusion maps were calculated using RAPID software. CONTRAST:  100 mL  Isovue 370 IV COMPARISON:  CT head 05/21/2016 FINDINGS: CTA NECK FINDINGS Aortic arch: Minimal atherosclerotic disease in the aortic arch. Proximal great vessels tortuous but widely patent. Right carotid system: Right common carotid artery widely patent. Right internal carotid artery widely patent. Severe stenosis at the origin of the right external carotid artery Left carotid system: Left common carotid artery widely patent with mild atherosclerotic disease. Mild atherosclerotic calcification of the left carotid bifurcation without significant stenosis. Vertebral arteries: Both vertebral arteries patent without significant stenosis. Skeleton: Scoliosis. No acute skeletal abnormality. Minimal degenerative change in the cervical spine. Other neck: Multiple small thyroid nodules. No mass or adenopathy in the neck. Upper chest: Lung apices clear. Review of the MIP images confirms the above findings CTA HEAD FINDINGS Anterior circulation: Mild atherosclerotic disease in the right cavernous carotid artery without significant stenosis. Right anterior cerebral artery widely patent. Right M1 widely patent. Right middle cerebral artery branches widely patent without stenosis. Mild atherosclerotic calcification left cavernous carotid without stenosis. Left anterior cerebral artery widely patent. Left M1 segment is widely patent. Anterior branch of the left middle cerebral artery shows decreased density suggesting hypoperfusion. There appears to be a stenosis at the origin of this M3 branch of the left middle cerebral artery supplying the left frontotemporal lobe. Posterior circulation: Both vertebral arteries patent to the basilar. Basilar patent. PICA, superior cerebellar, posterior cerebral arteries patent without significant stenosis or occlusion. Venous sinuses: Patent Anatomic variants: None Delayed phase: Not performed Review of the MIP images confirms the above findings CT Brain Perfusion Findings: CBF (<30%) Volume:  0mL Perfusion (Tmax>6.0s) volume: 23mL Mismatch Volume: 23mL Infarction Location:Left frontotemporal lobe and left frontal operculum IMPRESSION: 23 mL volume of hypoperfusion in the left frontotemporal lobe and left frontal operculum without fixed infarct by CT perfusion Hypoperfusion of the anterior branch of left MCA supplying the above area of infarction. This appears to be a left M3 branch which may have a severe stenosis. Left M1 is widely patent. No significant vertebral or carotid artery stenosis in the neck. The images were reviewed with Dr. Kirkpatrick at 1030 hours on 05/21/2016 Electronically Signed   By: Charles  Clark M.D.   On: 05/21/2016 10:53   Dg Swallowing Func-speech Pathology  Result Date: 05/22/2016 Objective Swallowing Evaluation: Type of Study: MBS-Modified Barium Swallow Study Patient Details Name: Georjean Hollander MRN: [id number] Date of Birth: 12/31/1950 Today's Date: 05/22/2016 Time: SLP Start Time (ACUTE ONLY): 1350-SLP Stop Time (ACUTE ONLY): 1415 SLP Time Calculation (min) (ACUTE ONLY): 25 min Past Medical History: Past Medical History: Diagnosis Date . Diabetes mellitus without complication (HCC)  .   Hypertension  . Neuropathy (HCC)  Past Surgical History: Past Surgical History: Procedure Laterality Date . CESAREAN SECTION   HPI: Keyonia Luginbillis a 65 y.o.femalepresenting with right sided weakness, right facial droop, dysarthria. PMH is significant for DM2 ,HTN, diabetic neuropathy. CT head without hemorrhage, CT perfusion with small area of penumbra in the anterior MCA per neurology. MRI 05/21/16 showed acute left basal ganglia infarct, moderate chronic small vessel ischemic disease. No prior swallowing evaluations in chart. Failed nursing stroke swallow screen. Subjective: Pt alert, not communicating verbally Assessment / Plan / Recommendation CHL IP CLINICAL IMPRESSIONS 05/22/2016 Clinical Impression Pt presents with a moderate oral, mild pharyngeal dysphagia marked by reduced  sensorimotor function - this leads to poor bolus cohesion and propulsion, anterior loss right side, right oral residue.  Swallow onset is delayed, particularly with thin liquids, which reach the pyriforms and then are aspirated during the swallow (accompanied by cough).  A chin tuck effectively prevented aspiration, but pt required significant cues to help her to carry-out chin tuck. There was good pharyngeal clearance of all POs.  Esophageal sweep revealed barium retention in the distal esophagus which remained throughout the study.  For now, recommend initiating a dysphagia 1 diet with nectar thick liquids.  Outside of meals, pt may have sips of thin liquid if she has 1:1 cueing and assist to tuck her chin.  Meds may be given whole in puree.  SLP will follow for safety/diet progression and pending speech language evaluation.  Pt's dtr present for exam and results/recs were reviewed.  SLP Visit Diagnosis Dysphagia, oropharyngeal phase (R13.12) Attention and concentration deficit following -- Frontal lobe and executive function deficit following -- Impact on safety and function Moderate aspiration risk   [facility] IP TREATMENT RECOMMENDATION 05/22/2016 Treatment Recommendations Therapy as outlined in treatment plan below   Prognosis 05/22/2016 Prognosis for Safe Diet Advancement Good Barriers to Reach Goals -- Barriers/Prognosis Comment -- [facility] IP DIET RECOMMENDATION 05/22/2016 SLP Diet Recommendations Dysphagia 1 (Puree) solids;Nectar thick liquid Liquid Administration via Cup Medication Administration Whole meds with puree Compensations Minimize environmental distractions;Slow rate;Small sips/bites;Lingual sweep for clearance of pocketing Postural Changes Remain semi-upright after after feeds/meals (Comment)   [facility] IP OTHER RECOMMENDATIONS 05/22/2016 Recommended Consults -- Oral Care Recommendations Oral care BID Other Recommendations Remove water pitcher;Have oral suction available   [facility] IP FOLLOW UP RECOMMENDATIONS 05/22/2016  Follow up Recommendations (No Data)   [facility] IP FREQUENCY AND DURATION 05/22/2016 Speech Therapy Frequency (ACUTE ONLY) min 3x week Treatment Duration 2 weeks      [facility] IP ORAL PHASE 05/22/2016 Oral Phase Impaired Oral - Pudding Teaspoon -- Oral - Pudding Cup -- Oral - Honey Teaspoon -- Oral - Honey Cup -- Oral - Nectar Teaspoon -- Oral - Nectar Cup Right anterior bolus loss;Weak lingual manipulation;Incomplete tongue to palate contact;Reduced posterior propulsion;Delayed oral transit Oral - Nectar Straw -- Oral - Thin Teaspoon -- Oral - Thin Cup Right anterior bolus loss;Weak lingual manipulation;Incomplete tongue to palate contact;Reduced posterior propulsion;Delayed oral transit Oral - Thin Straw -- Oral - Puree Weak lingual manipulation;Incomplete tongue to palate contact;Reduced posterior propulsion;Right anterior bolus loss;Delayed oral transit Oral - Mech Soft Right anterior bolus loss;Weak lingual manipulation;Incomplete tongue to palate contact;Reduced posterior propulsion;Delayed oral transit Oral - Regular -- Oral - Multi-Consistency -- Oral - Pill -- Oral Phase - Comment --  [facility] IP PHARYNGEAL PHASE 05/22/2016 Pharyngeal Phase Impaired Pharyngeal- Pudding Teaspoon -- Pharyngeal -- Pharyngeal- Pudding Cup -- Pharyngeal -- Pharyngeal- Honey Teaspoon -- Pharyngeal -- Pharyngeal- Honey Cup --   Pharyngeal -- Pharyngeal- Nectar Teaspoon -- Pharyngeal -- Pharyngeal- Nectar Cup Delayed swallow initiation-vallecula Pharyngeal -- Pharyngeal- Nectar Straw -- Pharyngeal -- Pharyngeal- Thin Teaspoon -- Pharyngeal -- Pharyngeal- Thin Cup Delayed swallow initiation-pyriform sinuses;Reduced airway/laryngeal closure;Penetration/Aspiration during swallow;Trace aspiration Pharyngeal Material enters airway, passes BELOW cords and not ejected out despite cough attempt by patient Pharyngeal- Thin Straw -- Pharyngeal -- Pharyngeal- Puree Delayed swallow initiation-vallecula Pharyngeal -- Pharyngeal- Mechanical Soft Delayed swallow  initiation-vallecula Pharyngeal -- Pharyngeal- Regular -- Pharyngeal -- Pharyngeal- Multi-consistency -- Pharyngeal -- Pharyngeal- Pill -- Pharyngeal -- Pharyngeal Comment --  [facility] IP CERVICAL ESOPHAGEAL PHASE 05/22/2016 Cervical Esophageal Phase (No Data) Pudding Teaspoon -- Pudding Cup -- Honey Teaspoon -- Honey Cup -- Nectar Teaspoon -- Nectar Cup -- Nectar Straw -- Thin Teaspoon -- Thin Cup -- Thin Straw -- Puree -- Mechanical Soft -- Regular -- Multi-consistency -- Pill -- Cervical Esophageal Comment -- No flowsheet data found. Couture, Amanda [name] 05/22/2016, 2:36 PM              Ct Angio Chest/abd/pel For Dissection W And/or W/wo  Result Date: 05/21/2016 CLINICAL DATA:  Hypertension. Found down this morning with right facial droop, aphasia and right sided weakness. EXAM: CT ANGIOGRAPHY CHEST, ABDOMEN AND PELVIS TECHNIQUE: Multidetector CT imaging through the chest, abdomen and pelvis was performed using the standard protocol during bolus administration of intravenous contrast. Multiplanar reconstructed images and MIPs were obtained and reviewed to evaluate the vascular anatomy. CONTRAST:  75 cc Isovue 370 IV. COMPARISON:  05/04/2016 CT abdomen/ pelvis. FINDINGS: CTA CHEST FINDINGS Cardiovascular: Top-normal heart size. Small to moderate pericardial effusion/thickening, mildly increased. Left anterior descending, left circumflex and right coronary atherosclerosis. Atherosclerotic nonaneurysmal thoracic aorta. No thoracic aortic intramural hematoma, dissection, pseudoaneurysm or penetrating atherosclerotic ulcer. Common origin of the brachiocephalic and left common carotid artery is from the aortic arch. Patent visualized proximal vertebral arteries. Atherosclerotic aortic arch branch vessels with no significant stenoses. Normal caliber pulmonary arteries. No central pulmonary emboli. Mediastinum/Nodes: Subcentimeter hypodense anterior right thyroid lobe nodule. Unremarkable esophagus. No pathologically  enlarged axillary, mediastinal or hilar lymph nodes. Lungs/Pleura: No pneumothorax. Trace dependent bilateral pleural effusions. Hypoventilatory changes in the dependent lungs. No acute consolidative airspace disease, lung masses or significant pulmonary nodules in the aerated portions of the lungs. Musculoskeletal: No aggressive appearing focal osseous lesions. Mild thoracic spondylosis Review of the MIP images confirms the above findings. CTA ABDOMEN AND PELVIS FINDINGS VASCULAR Aorta: Atherosclerotic nonaneurysmal abdominal aorta. No evidence of dissection, vasculitis or significant stenosis. Celiac: Patent without evidence of aneurysm, dissection, vasculitis or significant stenosis. SMA: Patent without evidence of aneurysm, dissection, vasculitis or significant stenosis. Renals: Single renal arteries bilaterally. Approximately 50% proximal right renal artery stenosis due to the atherosclerotic plaque. No significant left renal artery stenosis. IMA: Approximately 60% proximal IMA stenosis due to atherosclerotic plaque. Inflow: Patent without evidence of aneurysm, dissection, vasculitis or significant stenosis. Veins: No obvious venous abnormality within the limitations of this arterial phase study. Review of the MIP images confirms the above findings. NON-VASCULAR Hepatobiliary: Normal liver with no liver mass. Normal gallbladder with no radiopaque cholelithiasis. No biliary ductal dilatation. Pancreas: Normal, with no mass or duct dilation. Spleen: Normal size. No mass. Adrenals/Urinary Tract: Normal adrenals. Symmetric contrast nephrograms. No hydronephrosis. No renal mass. 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 Normal bladder. Stomach/Bowel: Small hiatal hernia. Otherwise collapsed and grossly normal   stomach. Normal caliber small bowel with no small bowel wall thickening. Normal  appendix. Normal large bowel with no diverticulosis, large bowel wall thickening or pericolonic fat stranding. Vascular/Lymphatic: Atherosclerotic nonaneurysmal abdominal aorta. No pathologically enlarged lymph nodes in the abdomen or pelvis. Reproductive: Grossly normal uterus. Contrast in the vagina is probably due to retrograde filling from urinary incontinence. No adnexal mass. Other: No pneumoperitoneum, ascites or focal fluid collection. Musculoskeletal: No aggressive appearing focal osseous lesions. Review of the MIP images confirms the above findings. IMPRESSION: 1. No acute aortic syndrome. 2. Small to moderate pericardial effusion, mildly increased since 05/04/2016. Consider echocardiographic correlation. 3. Trace dependent bilateral pleural effusions. No acute pulmonary disease. 4. No acute abnormality in the abdomen or pelvis. 5. Three-vessel coronary atherosclerosis. 6. 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 7. Small hiatal hernia. Electronically Signed   By: Jason A Poff M.D.   On: 05/21/2016 11:20   Ct Head Code Stroke Wo Contrast`  Result Date: 05/21/2016 CLINICAL DATA:  Code stroke.  Right-sided weakness EXAM: CT HEAD WITHOUT CONTRAST TECHNIQUE: Contiguous axial images were obtained from the base of the skull through the vertex without intravenous contrast. COMPARISON:  None. FINDINGS: Brain: Generalized atrophy. Low-density throughout the cerebral white matter bilaterally appears chronic. Negative for acute infarct. Negative for hemorrhage or mass lesion. No shift of the midline structures. Vascular: Negative for hyperdense vessel Skull: Negative Sinuses/Orbits: Mild mucosal edema left maxillary sinus. Normal orbit. Other: None ASPECTS (Alberta Stroke Program Early CT Score) - Ganglionic level infarction (caudate, lentiform nuclei, internal capsule, insula, M1-M3 cortex): 7 - Supraganglionic infarction (M4-M6 cortex): 3 Total score (0-10 with  10 being normal): 10 IMPRESSION: 1. Negative for acute infarct 2. ASPECTS is 10 3. Atrophy and moderate chronic microvascular ischemic change in the white matter I reviewed the images with Dr. Kirkpatrick at 1015 hours 05/21/2016. Electronically Signed   By: Charles  Clark M.D.   On: 05/21/2016 10:30    Cardiac Studies   ECHO Pending  Patient Profile     65 y.o. female with demand ischemia (elevated troponin in the setting of stroke) with abnormal ECG, no angina, normal CT angiogram (no dissection) in the setting of acute stroke (right sided weakness, aphasia)  Assessment & Plan    Demand ischemia  - troponin elevated in the setting of CVA and severe hypertension (likely in part supply demand mismatch), no anginal symptoms.   - ECG TWI are often seen in stroke as well. ST changes mild inferior j point 1mm changes. TWI biphasic in V2.   - CTA reassuring no dissection.   - No invasive workup at this time. As she heals from CVA, several weeks down the road, we could consider outpatient NUC stress.   - Statin, metoprolol, ASA (Plavix if able to take from neuro perspective)  - Avoiding IV heparin because of risk of hemorrhagic transformation  - No change today in management.   Hypertension  - reported non compliance with meds at home  - improved here with elevation in the setting of ischemic CVA.   - Permissive HTN --post CVA  Acute stroke  - per primary team/neuro  - TEE requested.    Signed, Jannely Henthorn, MD  05/23/2016, 9:16 AM    

## 2016-05-23 NOTE — Progress Notes (Signed)
Family Medicine Teaching Service Daily Progress Note Intern Pager: [phone]  Patient name: Paige Stewart Medical record number: [id number] Date of birth: 1950-06-25 Age: 66 y.o. Gender: female  Primary Care Provider: No PCP Per Patient Consultants: Cardiology, Neurology  Code Status: Partial. Patient does not want to be intubated.   Chief Complaint: right sided weakness, right facial droop, and dysarthria.   Assessment and Plan: Paige Stewart is a 66 y.o. female presenting with right sided weakness, right facial droop, dysarthria. PMH is significant for DM2 ,HTN, diabetic neuropathy.   Acute basal ganglia infarct - with Right Sided Weakness, facial droop and dysarthria: CT perfusion with small area of penumbra in the anterior MCA per neurology. MR brain with acute basal ganglia infarct. CK WNL. - neurology consulted and following, appreciate recommendations  - ASA  PR  - frequent neuro checks - PT/OT/Speech consult  - Atorvastatin 40 mg Qd  Elevated Troponin, , improving: trend  istat 1.91> >>>4.13>3.94 and EKG changes thought to be secondary to stroke. No chest pain or shortness of breath. - cardiology consulted, appreciate recommendations - consider outpatient NUC stress - no loop; patient going back to [place] - ASA  - ECHO pending, TEE 4/3  T2DM, poorly controlled 10.3 HbA1c this Admission. On metformin at home. Overnight CBG 156-219 requiring 13u lantus. - holding lantus -SSI insulin - consider starting NPH ie 5 mg BID - will discuss with daughter to come up with a  Sustainable plan given patient plans to go back to [place] at end of month  Hypokalemia - 3.0 today, repleated - AM BMP  UTI - Dysuria, no hematuria, no foul odor or discharge. UA with small Hgb, 5 ketones, large leuks, many bacteria  - will start keflex 500 mg BID x5 days  HTN: BP elevated overnight, improved this AM at 147/91.  Home medications: lisinopril 10 mg Qd and metoprolol 25 mg BID.  -  vitals per protocol - restart home lisinopril 10 mg QD - start HCTZ 12.5 mg QD - restart metoprolol at 12.5 mg BID, can titrate up to home dose of 25 mg BID as tolerated  Small to Moderate Pericardial Effusion: noted on CT mildly increased since 04/2016.  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   FEN/GI: NPO for TEE Prophylaxis: SCDs    Disposition: admit to FPTS telemetry for stroke and elevated troponin work up  Subjective:  No acute events overnight.   Objective: Temp:  [97.8 F (36.6 C)-99.1 F (37.3 C)] 97.8 F (36.6 C) (04/03 0329) Pulse Rate:  [82-102] 102 (04/03 0329) Resp:  [14-21] 20 (04/03 0329) BP: (147-189)/(91-110) 147/91 (04/03 0329) SpO2:  [94 %-99 %] 96 % (04/03 0329) Weight:  [66.5 kg (146 lb 8 oz)] 66.5 kg (146 lb 8 oz) (04/03 0409) Physical Exam: General: NAD, comfortable Cardiovascular: RRR, no m/r/g Respiratory: CTA bil, no W/R/R Abdomen: soft and nontender, nondistended Extremities: no LE edema or calf tenderness Neuro: +Dysarthria, Cn 2-4 intact. Decreased movement of right side of face. CN 6 intact. CN 8-12 normal except right shoulder shrug decreased.  R arm and leg weakness (1/5), R arm and R leg strength normal 4/5 bilaterally   Laboratory:  Important labs this admission  Lipid Panel     Component Value Date/Time   CHOL 168 05/22/2016 0006   TRIG 81 05/22/2016 0006   HDL 34 (L) 05/22/2016 0006   CHOLHDL 4.9 05/22/2016 0006  VLDL 16 05/22/2016 0006   LDLCALC 118 (H) 05/22/2016 0006      Recent Labs Lab 05/21/16 0953 05/21/16 1006 05/22/16 0006 05/23/16 0511  WBC 5.1  --  4.7 6.3  HGB 12.6 13.3 12.9 13.1  HCT 37.3 39.0 36.6 37.1  PLT 233  --  232 258    Recent Labs Lab 05/21/16 0953 05/21/16 1006 05/22/16 0006 05/23/16 0511  NA 137 138 139 137  K 3.8 3.8 3.5 3.0*  CL 101 101 105 105  CO2 27  --  26 23  BUN CREATININE 0.76 0.70 0.71 0.64  CALCIUM 9.3  --  9.0 8.7*  PROT 7.2  --   --   --   BILITOT 0.4  --   --   --   ALKPHOS 51  --   --   --   ALT 17  --   --   --   AST 21  --   --   --   GLUCOSE 254* 256* 183* 155*      Imaging/Diagnostic Tests: EKG with T wave inversion in 1, aVL and V2 with mild ST elevation in V2.  CT ANGIOGRAPHY HEAD AND NECK CT PERFUSION BRAIN TECHNIQUE - 05/21/2016:  FINDINGS: CTA NECK FINDINGS Aortic arch: Minimal atherosclerotic disease in the aortic arch. Proximal great vessels tortuous but widely patent. Right carotid system: Right common carotid artery widely patent. Right internal carotid artery widely patent. Severe stenosis at the origin of the right external carotid artery Left carotid system: Left common carotid artery widely patent with mild atherosclerotic disease. Mild atherosclerotic calcification of the left carotid bifurcation without significant stenosis. Vertebral arteries: Both vertebral arteries patent without significant stenosis. Skeleton: Scoliosis. No acute skeletal abnormality. Minimal degenerative change in the cervical spine. Other neck: Multiple small thyroid nodules. No mass or adenopathy in the neck. Upper chest: Lung apices clear. Review of the MIP images confirms the above findings   CTA HEAD FINDINGS  Anterior circulation: Mild atherosclerotic disease in the right cavernous carotid artery without significant stenosis. Right anterior cerebral artery widely patent. Right M1 widely patent. Right middle cerebral artery branches widely patent without stenosis. Mild atherosclerotic calcification left cavernous carotid without stenosis. Left anterior cerebral artery widely patent. Left M1 segment is widely patent. Anterior branch of the left middle cerebral artery shows decreased density suggesting hypoperfusion. There appears to be a stenosis at the origin of this M3 branch of the left middle cerebral artery supplying the left frontotemporal  lobe. Posterior circulation: Both vertebral arteries patent to the basilar. Basilar patent. PICA, superior cerebellar, posterior cerebral arteries patent without significant stenosis or occlusion. Venous sinuses: Patent Anatomic variants: None Delayed phase: Not performed Review of the MIP images confirms the above findings CT Brain Perfusion Findings: CBF (<30%) Volume: 0mL Perfusion (Tmax>6.0s) volume: 23mL Mismatch Volume: 23mL Infarction Location:Left frontotemporal lobe and left frontal operculum  IMPRESSION: 23 mL volume of hypoperfusion in the left frontotemporal lobe and left frontal operculum without fixed infarct by CT perfusion Hypoperfusion of the anterior branch of left MCA supplying the above area of infarction. This appears to be a left M3 branch which may have a severe stenosis. Left M1 is widely patent. No significant vertebral or carotid artery stenosis in the neck.  Ct Angio Neck W And/or Wo Contrast  05/21/2016 CTA NECK FINDINGS Aortic arch: Minimal atherosclerotic disease in the aortic arch. Proximal great vessels tortuous but widely patent. Right carotid system: Right common carotid artery  widely patent. Right internal carotid artery widely patent. Severe stenosis at the origin of the right external carotid artery Left carotid system: Left common carotid artery widely patent with mild atherosclerotic disease. Mild atherosclerotic calcification of the left carotid bifurcation without significant stenosis. Vertebral arteries: Both vertebral arteries patent without significant stenosis. Skeleton: Scoliosis. No acute skeletal abnormality. Minimal degenerative change in the cervical spine. Other neck: Multiple small thyroid nodules. No mass or adenopathy in the neck. Upper chest: Lung apices clear. Review of the MIP images confirms the above findings   CTA HEAD FINDINGS Anterior circulation: Mild atherosclerotic disease in the right cavernous carotid artery without significant stenosis. Right  anterior cerebral artery widely patent. Right M1 widely patent. Right middle cerebral artery branches widely patent without stenosis. Mild atherosclerotic calcification left cavernous carotid without stenosis. Left anterior cerebral artery widely patent. Left M1 segment is widely patent. Anterior branch of the left middle cerebral artery shows decreased density suggesting hypoperfusion. There appears to be a stenosis at the origin of this M3 branch of the left middle cerebral artery supplying the left frontotemporal lobe. Posterior circulation: Both vertebral arteries patent to the basilar. Basilar patent. PICA, superior cerebellar, posterior cerebral arteries patent without significant stenosis or occlusion. Venous sinuses: Patent Anatomic variants: None Delayed phase: Not performed Review of the MIP images confirms the above findings   CT Brain Perfusion Findings: CBF (<30%) Volume: 0mL Perfusion (Tmax>6.0s) volume: 23mL Mismatch Volume: 23mL Infarction Location:Left frontotemporal lobe and left frontal operculum   IMPRESSION: 23 mL volume of hypoperfusion in the left frontotemporal lobe and left frontal operculum without fixed infarct by CT perfusion Hypoperfusion of the anterior branch of left MCA supplying the above area of infarction. This appears to be a left M3 branch which may have a severe stenosis. Left M1 is widely patent. No significant vertebral or carotid artery stenosis in the neck.   Mr Brain Wo Contrast 05/21/2016 FINDINGS: Brain: There is an acute left lateral lenticulostriate territory infarct extending from the body of the left caudate nucleus into the posterior left lentiform nucleus and external capsule region. There is no evidence of associated hemorrhage. No mass, midline shift, or extra-axial fluid collection is seen. There is mild cerebral atrophy. Patchy T2 hyperintensities throughout the subcortical and deep cerebral white matter bilaterally are nonspecific but compatible with  moderate chronic small vessel ischemic disease. There is a chronic lacunar infarct in the left caudate. Vascular: Major intracranial vascular flow voids are preserved. Skull and upper cervical spine: Unremarkable bone marrow signal. Sinuses/Orbits: Unremarkable orbits.  No significant sinus disease. Other: None.  IMPRESSION:  1. Acute left basal ganglia infarct.  2. Moderate chronic small vessel ischemic disease. Ct Cerebral Perfusion W Contrast   CT ANGIOGRAPHY HEAD AND NECK CT PERFUSION BRAIN TECHNIQUE FINDINGS: CTA NECK FINDINGS 05/21/2016 Aortic arch: Minimal atherosclerotic disease in the aortic arch. Proximal great vessels tortuous but widely patent. Right carotid system: Right common carotid artery widely patent. Right internal carotid artery widely patent. Severe stenosis at the origin of the right external carotid artery Left carotid system: Left common carotid artery widely patent with mild atherosclerotic disease. Mild atherosclerotic calcification of the left carotid bifurcation without significant stenosis. Vertebral arteries: Both vertebral arteries patent without significant stenosis. Skeleton: Scoliosis. No acute skeletal abnormality. Minimal degenerative change in the cervical spine. Other neck: Multiple small thyroid nodules. No mass or adenopathy in the neck. Upper chest: Lung apices clear. Review of the MIP images confirms the above findings   CTA HEAD FINDINGS Anterior  circulation: Mild atherosclerotic disease in the right cavernous carotid artery without significant stenosis. Right anterior cerebral artery widely patent. Right M1 widely patent. Right middle cerebral artery branches widely patent without stenosis. Mild atherosclerotic calcification left cavernous carotid without stenosis. Left anterior cerebral artery widely patent. Left M1 segment is widely patent. Anterior branch of the left middle cerebral artery shows decreased density suggesting hypoperfusion. There appears to be a  stenosis at the origin of this M3 branch of the left middle cerebral artery supplying the left frontotemporal lobe. Posterior circulation: Both vertebral arteries patent to the basilar. Basilar patent. PICA, superior cerebellar, posterior cerebral arteries patent without significant stenosis or occlusion. Venous sinuses: Patent Anatomic variants: None Delayed phase: Not performed Review of the MIP images confirms the above findings CT Brain Perfusion Findings: CBF (<30%) Volume: 0mL Perfusion (Tmax>6.0s) volume: 23mL Mismatch Volume: 23mL Infarction Location:Left frontotemporal lobe and left frontal operculum  IMPRESSION: 23 mL volume of hypoperfusion in the left frontotemporal lobe and left frontal operculum without fixed infarct by CT perfusion Hypoperfusion of the anterior branch of left MCA supplying the above area of infarction. This appears to be a left M3 branch which may have a severe stenosis. Left M1 is widely patent. No significant vertebral or carotid artery stenosis in the neck.   Howard Pouch, MD 05/23/2016, 6:54 AM PGY-1, [facility] Family Medicine FPTS Intern pager: [phone], text pages welcome

## 2016-05-23 NOTE — Interval H&P Note (Signed)
History and Physical Interval Note:  05/23/2016 11:09 AM  Paige Stewart  has presented today for surgery, with the diagnosis of STROKE  The various methods of treatment have been discussed with the patient and family. After consideration of risks, benefits and other options for treatment, the patient has consented to  Procedure(s): TRANSESOPHAGEAL ECHOCARDIOGRAM (TEE) (N/A) as a surgical intervention .  The patient's history has been reviewed, patient examined, no change in status, stable for surgery.  I have reviewed the patient's chart and labs.  Questions were answered to the patient's satisfaction.     Tobias Alexander

## 2016-05-23 NOTE — Progress Notes (Signed)
STROKE TEAM PROGRESS NOTE   SUBJECTIVE (INTERVAL HISTORY) Her daughter, who is a traveling Engineer, civil (consulting), is at the bedside. States pt is speaking some in her native tongue. She states her mother is typically more awake in the afternoons prior to stroke. Daughter is working on extending her mother's visa.   OBJECTIVE Temp:  [97.8 F (36.6 C)-99.1 F (37.3 C)] 98 F (36.7 C) (04/03 0804) Pulse Rate:  [82-102] 93 (04/03 0804) Cardiac Rhythm: Normal sinus rhythm (04/03 0700) Resp:  [14-21] 16 (04/03 0804) BP: (147-189)/(91-113) 188/113 (04/03 0804) SpO2:  [94 %-99 %] 98 % (04/03 0804) Weight:  [66.5 kg (146 lb 8 oz)] 66.5 kg (146 lb 8 oz) (04/03 0409)  CBC:  Recent Labs Lab 05/21/16 0953  05/22/16 0006 05/23/16 0511  WBC 5.1  --  4.7 6.3  NEUTROABS 3.6  --   --   --   HGB 12.6  < > 12.9 13.1  HCT 37.3  < > 36.6 37.1  MCV 86.9  --  86.5 86.3  PLT 233  --  232 258  < > = values in this interval not displayed.  Basic Metabolic Panel:   Recent Labs Lab 05/22/16 0006 05/23/16 0511  NA 139 137  K 3.5 3.0*  CL 105 105  CO2 26 23  GLUCOSE 183* 155*  BUN 11 8  CREATININE 0.71 0.64  CALCIUM 9.0 8.7*     PHYSICAL EXAM Pleasant middle age african lady not in distress. . Afebrile. Head is nontraumatic. Neck is supple without bruit.    Cardiac exam no murmur or gallop. Lungs are clear to auscultation. Distal pulses are well felt. Neurological Exam :  Awake alert. severe expressive aphasia and will speak only a few words. Able to follow some midline and few simple commands but needs the questions to be repeated 2-3 times. Blinks to threat on the left but not the right. Right lower facial weakness. Tongue midline. Dense right hemiplegia with only trace withdrawal to painful stimuli in the legs. Tone is diminished on the right compared to the left. Reflexes are depressed on the right and normal on the left. Right plantar upgoing left downgoing. Gait was not tested.   ASSESSMENT/PLAN [name] is a 66 y.o. female with history of HTN, diabetes and neuropathy presenting with R sided weakness. She did not receive IV t-PA due to delay in arrival.   Stroke:  left basal ganglia infarct embolic secondary to unknown source  Resultant  R hemiparesis, R facial, likely some aphasia (decreased ability to follow commands in non-primary English speaker)  Code Stroke CT head no acute infarcts. Aspects 10. Chronic microvascular disease  CTA head L MCA hypoperfusion corresponding to infarct w/ severe L M3 stenosis.   CTA neck Unremarkable   CT P penumbra 3 mL left frontal love without fixed infarct   MRI  left basal ganglia infarct. Moderate small vessel ischemic disease.  2D Echo  pending   TEE today at 11a to look for embolic source.   Will not consider further embolic workup with a loop for atrial fibrillation monitoring given planned return to [place]  LDL 118  HgbA1c 10.3  SCDs for VTE prophylaxis Diet NPO time specified  No antithrombotic prior to admission, now on aspirin 300 mg suppository daily. Can change to po when able to swallow  Therapy recommendations:  Pending. BR currently ordered. Ok to be OOB from stroke standpoint. Will order.  Disposition:  pending  (lives alone in [place], visiting family  in [place], has emergency health insurance)  Recommend completing rehab in the [place] prior to return to [place]  Hypertensive Urgency  BP as high as 224/124 on arrival in setting of neurologic symptoms  Remain elevated 180-190s  Permissive hypertension (OK if < 220/120) but gradually normalize in 5-7 days Ok to resume home meds. Would not aggressively lower BP  Long-term BP goal normotensive  Hyperlipidemia  Home meds:  No statin  LDL 118 goal  Added statin  - lipitor 40 mg daily  Continue statin at discharge  Diabetes  HgbA1c goal < 7.0  Other Stroke Risk Factors  Advanced age  UDS not performed  Overweight, Body mass index is 29.59  kg/m.  Other Active Problems  Elevated troponin/demand ischemia with T-wave inversion. Cardiology consulted. No workup at this time. Consider OP workup.  CTA/C/P No aortic syndrome. Small to moderate pericardial effusion, mildly increased since 05/04/2016. Trace dependent bilateral pleural effusions. Three-vessel coronary atherosclerosis. Moderate RAS.  Small hiatal hernia.   Hospital day # 2  [name] [facility] See Amion for Pager information 05/23/2016 9:33 AM  I have personally examined this patient, reviewed notes, independently viewed imaging studies, participated in medical decision making and plan of care.ROS completed by me personally and pertinent positives fully documented  I have made any additions or clarifications directly to the above note. Plan transesophageal echocardiogram later today. We will not do loop recorder as patient will not be available for follow-up as she [place] in another country. Discuss with daughter and answered questions. Greater than 50% time during this 25 minute visit was spent on counseling and coordination of care discussion about stroke etiology, evaluation and treatment    Delia Heady, MD Medical Director [facility] Pager: [phone] 05/23/2016 1:35 PM  To contact Stroke Continuity provider, please refer to [email or web address]. After hours, contact General Neurology

## 2016-05-23 NOTE — Progress Notes (Addendum)
OT Cancellation Note  Patient Details Name: Paige Stewart MRN: [id number] DOB: Jun 18, 1950   Cancelled Treatment:    Reason Eval/Treat Not Completed: Medical issues which prohibited therapy;Patient at procedure or test/ unavailable. Per RN, pt preparing to go off unit for TEE  Galen Manila 05/23/2016, 10:04 AM

## 2016-05-23 NOTE — Evaluation (Signed)
Speech Language Pathology Evaluation Patient Details Name: Paige Stewart MRN: [id number] DOB: [birth year] Today's Date: 05/23/2016 Time: 1350-1405 SLP Time Calculation (min) (ACUTE ONLY): 15 min  Problem List:  Patient Active Problem List   Diagnosis Date Noted  . Hypertension   . Right sided weakness   . 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 Diabetes mellitus without complication (HCC)   . Hypertension   . Neuropathy [facility])    Past Surgical History:  Past Surgical History:  Procedure Laterality Date  . CESAREAN SECTION     HPI:  Paige Stewart a 66 y.o.female, visiting her daughter from [place] with right sided weakness, right facial droop, dysarthria. PMH is significant for DM2 ,HTN, diabetic neuropathy. CT head without hemorrhage, CT perfusion with small area of penumbra in the anterior MCA per neurology. MRI 05/21/16 showed acute left basal ganglia infarct, moderate chronic small vessel ischemic disease. No prior swallowing evaluations in chart. Failed nursing stroke swallow screen.   Assessment / Plan / Recommendation Clinical Impression  Pt presents with a mixed aphasia.  Her sister-in-law was present for assessment to help translate into pt's primary language to facilitate understanding and maximize success.  Pt demonstrates impaired intelligibility, perseveratory output during naming tasks with difficulty shifting words when targets changed.  Poor discrimination among basic objects in room.  Able to repeat.  Follows simple commands when they are highly predictable and within context.  Pt will benefit from aphasia therapy, family education to improve receptive and expressive communication.     SLP Assessment  SLP Recommendation/Assessment: Patient needs continued Speech Lanaguage Pathology Services SLP Visit Diagnosis: Cognitive communication deficit (R41.841)    Follow Up Recommendations        Frequency and Duration min 3x week  2 weeks      SLP Evaluation Cognition  Overall Cognitive Status: Impaired/Different from baseline Arousal/Alertness: Awake/alert Orientation Level: Other (comment) (unable to assess due to aphasia) Attention: Focused Focused Attention: Appears intact       Comprehension  Auditory Comprehension Overall Auditory Comprehension: Impaired Yes/No Questions: Impaired Basic Immediate Environment Questions: 50-74% accurate Commands: Impaired One Step Basic Commands: 25-49% accurate Conversation: Simple Visual Recognition/Discrimination Discrimination: Exceptions to [facility] Common Objects: Able in field of 2 (with 60% accuracy) Reading Comprehension Reading Status: Not tested    Expression Expression Primary Mode of Expression: Verbal Verbal Expression Overall Verbal Expression: Impaired Initiation: Impaired Automatic Speech: Counting Level of Generative/Spontaneous Verbalization: Phrase Repetition: Impaired Level of Impairment: Word level Naming: Impairment Responsive: 0-25% accurate Confrontation: Impaired Common Objects: Able in field of 2 (with 60% accuracy) Convergent: 0-24% accurate Verbal Errors: Perseveration   Oral / Motor  Oral Motor/Sensory Function Overall Oral Motor/Sensory Function: Moderate impairment Facial ROM: Reduced right;Suspected CN VII (facial) dysfunction Facial Symmetry: Abnormal symmetry right;Suspected CN VII (facial) dysfunction Facial Strength: Reduced right;Suspected CN VII (facial) dysfunction Facial Sensation: Reduced right;Suspected CN V (Trigeminal) dysfunction Motor Speech Overall Motor Speech: Impaired Respiration: Impaired Level of Impairment: Sentence Phonation: Low vocal intensity Articulation: Impaired Intelligibility: Intelligibility reduced Phrase: 75-100% accurate   GO                    Paige Stewart Paige Stewart 05/23/2016, 2:29 PM

## 2016-05-23 NOTE — Evaluation (Signed)
Physical Therapy Evaluation Patient Details Name: Paige Stewart MRN: [id number] DOB: Aug 06, 1950 Today's Date: 05/23/2016   History of Present Illness  Pt is a 66 y.o. female admitted to ED on 05/21/2016 with R-side weakness, R facial droop, and dysarthria; pt from [place] and was visiting family in [place]. CT angio shows hypoperfusion in L frontotemporal lobe, L frontal operculum , and anterior brance of L MCA. TEE on 4/3 shows no intracardiac source of embolism. 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 Pertinent PMH includes DM, HTN, peripheral neuropathy.   Clinical Impression  Pt presents to PT with R-side hemiplegia, L gaze preference, expressive and receptive aphasia (?? - versus language barrier) and an overall decrease in functional mobility secondary to above. Sister-in-law present throughout session and able to provide details regarding pt's PLOF. Pt lives in [place] and is here visiting her daughter with a temporary visa; pt indep with all ADLs and functional mobility. Today, pt required maxA +2 for bed mobility and standing; limited by increased lethargy. Pt would benefit from continued acute PT services to maximize functional mobility and decrease caregiver burden.     Follow Up Recommendations CIR;Supervision/Assistance - 24 hour    Equipment Recommendations  Other (comment) (Defer to next venue)    Recommendations for Other Services Rehab consult;OT consult     Precautions / Restrictions Precautions Precautions: Fall Restrictions Weight Bearing Restrictions: No      Mobility  Bed Mobility Overal bed mobility: Needs Assistance Bed Mobility: Supine to Sit;Sit to Supine     Supine to sit: Max assist     General bed mobility comments: Bed mobility with increased time and maxA +2 for trunk support and to maneuver BLE off bed; pt able to use LUE HHA to come into sitting. Able to initiate sit-to-supine, requiring maxA +2 to maneuver BLE and scoot in bed.   Transfers Overall  transfer level: Needs assistance Equipment used: 1 person hand held assist (LUE HHA on back of chair) Transfers: Sit to/from Stand Sit to Stand: Max assist;+2 physical assistance         General transfer comment: Stood to with RUE support on back of chair and maxA for trunk elevation and hip extension; pt with significant R lateral lean and pushing from LLE.   Ambulation/Gait                Stairs            Wheelchair Mobility    Modified Rankin (Stroke Patients Only) Modified Rankin (Stroke Patients Only) Pre-Morbid Rankin Score: No symptoms Modified Rankin: Severe disability     Balance Overall balance assessment: Needs assistance Sitting-balance support: Single extremity supported;Feet supported Sitting balance-Leahy Scale: Fair Sitting balance - Comments: Pt maxA to min guard for seated balance; significant R lateral lean requiring multimodal cues to achieve upright. Able to activate trunk extensors to achieve upright when mirroring therapist motion. Pushing from LUE.  Postural control: Posterior lean;Right lateral lean Standing balance support: Single extremity supported Standing balance-Leahy Scale: Zero Standing balance comment: Requires maxA +2 to maintain standing balance.                              Pertinent Vitals/Pain Pain Assessment: No/denies pain Faces Pain Scale: No hurt    Home Living Family/patient expects to be discharged to:: Private residence Living Arrangements: Children Available Help at Discharge: Family Type of Home: Apartment Home Access: Stairs to enter   [employer] of  Steps: 45 (3rd floor)     Additional Comments: Pt from [place], visiting daughter in [place]; daughter lives in 3rd floor apartment.     Prior Function Level of Independence: Independent         Comments: Sister-in-law present and reports pt indep with all functional mobility and ADLs PTA.      Hand Dominance         Extremity/Trunk Assessment   Upper Extremity Assessment Upper Extremity Assessment: Defer to OT evaluation;RUE deficits/detail RUE Deficits / Details: Flaccid RUE    Lower Extremity Assessment Lower Extremity Assessment: RLE deficits/detail RLE Deficits / Details: RLE hemiplegia- able to stand without R knee buckling, although not bearing full weight on RLE.  RLE Sensation: decreased light touch       Communication   Communication: Receptive difficulties;Expressive difficulties (Aphasia)  Cognition Arousal/Alertness: Lethargic Behavior During Therapy: Flat affect Overall Cognitive Status: Difficult to assess Area of Impairment: Orientation;Following commands;Awareness;Safety/judgement;Problem solving;Attention                   Current Attention Level: Sustained   Following Commands: Follows one step commands with increased time;Follows one step commands inconsistently Safety/Judgement: Decreased awareness of deficits;Decreased awareness of safety Awareness: Intellectual Problem Solving: Slow processing;Decreased initiation;Requires verbal cues;Requires tactile cues General Comments: Sister-in-law reports pt is fluent in [place], although this is not her native language. Pt able to nod yes/no to questions in English <25% of time; appeared to repond better when asked questions by sister-in-law in native language. Followed one step commands with increased time. Able to voice some answers with unintelligble words.       General Comments      Exercises     Assessment/Plan    PT Assessment Patient needs continued PT services  PT Problem List Decreased strength;Decreased mobility;Decreased safety awareness;Decreased knowledge of precautions;Decreased range of motion;Decreased activity tolerance;Decreased cognition;Impaired sensation;Decreased knowledge of use of DME;Decreased balance       PT Treatment Interventions DME instruction;Therapeutic activities;Cognitive  remediation;Patient/family education;Therapeutic exercise;Gait training;Balance training;Stair training;Functional mobility training;Neuromuscular re-education    PT Goals (Current goals can be found in the Care Plan section)  Acute Rehab PT Goals Patient Stated Goal: Pt unable to state goals PT Goal Formulation: Patient unable to participate in goal setting Time For Goal Achievement: 06/06/16    Frequency Min 3X/week   Barriers to discharge Decreased caregiver support;Inaccessible home environment Pt from [place], temporarily visiting daughter in [place] area; has a temporary travel visa.     Co-evaluation               End of Session Equipment Utilized During Treatment: Gait belt Activity Tolerance: Patient limited by lethargy Patient left: in bed;with bed alarm set;with call bell/phone within reach;with family/visitor present Nurse Communication: Mobility status PT Visit Diagnosis: Other abnormalities of gait and mobility (R26.89);Hemiplegia and hemiparesis Hemiplegia - Right/Left: Right Hemiplegia - caused by: Nontraumatic intracerebral hemorrhage    Time: 1330-1353 PT Time Calculation (min) (ACUTE ONLY): 23 min   Charges:   PT Evaluation $PT Eval Moderate Complexity: 1 Procedure PT Treatments $Therapeutic Activity: 8-22 mins   PT G Codes:       Dewayne Hatch, SPT Office-[phone]  Ina [employer] 05/23/2016, 3:49 PM

## 2016-05-23 NOTE — Progress Notes (Signed)
  Echocardiogram Echocardiogram Transesophageal has been performed.  Tye Savoy 05/23/2016, 11:58 AM

## 2016-05-23 NOTE — Progress Notes (Signed)
Physical Therapy Note:  Pt currently off floor, will check back as time allows.   Lyanne Co, PT  Acute Rehab Services  [phone]

## 2016-05-23 NOTE — H&P (View-Only) (Signed)
Progress Note  Patient Name: Paige Stewart Date of Encounter: 05/23/2016  Primary Cardiologist: Anne Fu  Subjective   No CP, no SOB. Her daughter said she was able to say a few words. No change in movement.   Inpatient Medications    Scheduled Meds: . aspirin  300 mg Rectal Daily  . atorvastatin  40 mg Oral q1800  . insulin aspart  0-9 Units Subcutaneous Q4H  . metoprolol  2.5 mg Intravenous Q6H   Continuous Infusions: . sodium chloride 100 mL/hr at 05/23/16 0013   PRN Meds: acetaminophen **OR** acetaminophen (TYLENOL) oral liquid 160 mg/5 mL **OR** acetaminophen, hydrALAZINE, senna-docusate   Vital Signs    Vitals:   05/23/16 0329 05/23/16 0409 05/23/16 0735 05/23/16 0804  BP: (!) 147/91  (!) 189/103 (!) 188/113  Pulse: (!) 102  89 93  Resp: Temp: 97.8 F (36.6 C)  98.1 F (36.7 C) 98 F (36.7 C)  TempSrc: Axillary  Axillary Axillary  SpO2: 96%  96% 98%  Weight:  146 lb 8 oz (66.5 kg)      Intake/Output Summary (Last 24 hours) at 05/23/16 0916 Last data filed at 05/23/16 0805  Gross per 24 hour  Intake             2000 ml  Output                0 ml  Net             2000 ml   Filed Weights   05/21/16 0951 05/22/16 0548 05/23/16 0409  Weight: 148 lb (67.1 kg) 146 lb 12.8 oz (66.6 kg) 146 lb 8 oz (66.5 kg)    Telemetry    No adverse rhythms unchanged- Personally Reviewed  ECG    NSR with TWI noted - Personally Reviewed  Physical Exam   GEN: No acute distress.  Laying in bed Neck: No JVD Cardiac: RRR, no murmurs, rubs, or gallops.  Respiratory: Clear to auscultation bilaterally. GI: Soft, nontender, non-distended obese MS: No edema; No deformity. Neuro:  Still with aphasia, right weakness  Psych: Normal affect   Labs    Chemistry  Recent Labs Lab 05/21/16 0953 05/21/16 1006 05/22/16 0006 05/23/16 0511  NA 137 138 139 137  K 3.8 3.8 3.5 3.0*  CL 101 101 105 105  CO2 27  --  26 23  GLUCOSE 254* 256* 183* 155*  BUN CREATININE 0.76 0.70 0.71 0.64  CALCIUM 9.3  --  9.0 8.7*  PROT 7.2  --   --   --   ALBUMIN 3.9  --   --   --   AST 21  --   --   --   ALT 17  --   --   --   ALKPHOS 51  --   --   --   BILITOT 0.4  --   --   --   GFRNONAA >60  --  >60 >60  GFRAA >60  --  >60 >60  ANIONGAP 9  --  8 9     Hematology  Recent Labs Lab 05/21/16 0953 05/21/16 1006 05/22/16 0006 05/23/16 0511  WBC 5.1  --  4.7 6.3  RBC 4.29  --  4.23 4.30  HGB 12.6 13.3 12.9 13.1  HCT 37.3 39.0 36.6 37.1  MCV 86.9  --  86.5 86.3  MCH 29.4  --  30.5 30.5  MCHC 33.8  --  35.2 35.3  RDW 13.2  --  13.4 13.4  PLT 233  --  232 258    Cardiac Enzymes  Recent Labs Lab 05/22/16 0006 05/22/16 0604 05/22/16 1235 05/22/16 1752  TROPONINI 4.13* 3.94* 4.12* 4.00*     Recent Labs Lab 05/21/16 1000  TROPIPOC 1.91*     BNPNo results for input(s): BNP, PROBNP in the last 168 hours.   DDimer No results for input(s): DDIMER in the last 168 hours.   Radiology    Ct Angio Head W Or Wo Contrast  Result Date: 05/21/2016 CLINICAL DATA:  Right facial and arm and leg weakness. EXAM: CT ANGIOGRAPHY HEAD AND NECK CT PERFUSION BRAIN TECHNIQUE: Multidetector CT imaging of the head and neck was performed using the standard protocol during bolus administration of intravenous contrast. Multiplanar CT image reconstructions and MIPs were obtained to evaluate the vascular anatomy. Carotid stenosis measurements (when applicable) are obtained utilizing NASCET criteria, using the distal internal carotid diameter as the denominator. Multiphase CT imaging of the brain was performed following IV bolus contrast injection. Subsequent parametric perfusion maps were calculated using RAPID software. CONTRAST:  100 mL Isovue 370 IV COMPARISON:  CT head 05/21/2016 FINDINGS: CTA NECK FINDINGS Aortic arch: Minimal atherosclerotic disease in the aortic arch. Proximal great vessels tortuous but widely patent. Right carotid system: Right common  carotid artery widely patent. Right internal carotid artery widely patent. Severe stenosis at the origin of the right external carotid artery Left carotid system: Left common carotid artery widely patent with mild atherosclerotic disease. Mild atherosclerotic calcification of the left carotid bifurcation without significant stenosis. Vertebral arteries: Both vertebral arteries patent without significant stenosis. Skeleton: Scoliosis. No acute skeletal abnormality. Minimal degenerative change in the cervical spine. Other neck: Multiple small thyroid nodules. No mass or adenopathy in the neck. Upper chest: Lung apices clear. Review of the MIP images confirms the above findings CTA HEAD FINDINGS Anterior circulation: Mild atherosclerotic disease in the right cavernous carotid artery without significant stenosis. Right anterior cerebral artery widely patent. Right M1 widely patent. Right middle cerebral artery branches widely patent without stenosis. Mild atherosclerotic calcification left cavernous carotid without stenosis. Left anterior cerebral artery widely patent. Left M1 segment is widely patent. Anterior branch of the left middle cerebral artery shows decreased density suggesting hypoperfusion. There appears to be a stenosis at the origin of this M3 branch of the left middle cerebral artery supplying the left frontotemporal lobe. Posterior circulation: Both vertebral arteries patent to the basilar. Basilar patent. PICA, superior cerebellar, posterior cerebral arteries patent without significant stenosis or occlusion. Venous sinuses: Patent Anatomic variants: None Delayed phase: Not performed Review of the MIP images confirms the above findings CT Brain Perfusion Findings: CBF (<30%) Volume: 0mL Perfusion (Tmax>6.0s) volume: 23mL Mismatch Volume: 23mL Infarction Location:Left frontotemporal lobe and left frontal operculum IMPRESSION: 23 mL volume of hypoperfusion in the left frontotemporal lobe and left frontal  operculum without fixed infarct by CT perfusion Hypoperfusion of the anterior branch of left MCA supplying the above area of infarction. This appears to be a left M3 branch which may have a severe stenosis. Left M1 is widely patent. No significant vertebral or carotid artery stenosis in the neck. The images were reviewed with Dr. Amada Jupiter at 1030 hours on 05/21/2016 Electronically Signed   By: Marlan Palau M.D.   On: 05/21/2016 10:53   Ct Angio Neck W And/or Wo Contrast  Result Date: 05/21/2016 CLINICAL DATA:  Right facial and arm and leg weakness. EXAM: CT ANGIOGRAPHY HEAD  AND NECK CT PERFUSION BRAIN TECHNIQUE: Multidetector CT imaging of the head and neck was performed using the standard protocol during bolus administration of intravenous contrast. Multiplanar CT image reconstructions and MIPs were obtained to evaluate the vascular anatomy. Carotid stenosis measurements (when applicable) are obtained utilizing NASCET criteria, using the distal internal carotid diameter as the denominator. Multiphase CT imaging of the brain was performed following IV bolus contrast injection. Subsequent parametric perfusion maps were calculated using RAPID software. CONTRAST:  100 mL Isovue 370 IV COMPARISON:  CT head 05/21/2016 FINDINGS: CTA NECK FINDINGS Aortic arch: Minimal atherosclerotic disease in the aortic arch. Proximal great vessels tortuous but widely patent. Right carotid system: Right common carotid artery widely patent. Right internal carotid artery widely patent. Severe stenosis at the origin of the right external carotid artery Left carotid system: Left common carotid artery widely patent with mild atherosclerotic disease. Mild atherosclerotic calcification of the left carotid bifurcation without significant stenosis. Vertebral arteries: Both vertebral arteries patent without significant stenosis. Skeleton: Scoliosis. No acute skeletal abnormality. Minimal degenerative change in the cervical spine. Other  neck: Multiple small thyroid nodules. No mass or adenopathy in the neck. Upper chest: Lung apices clear. Review of the MIP images confirms the above findings CTA HEAD FINDINGS Anterior circulation: Mild atherosclerotic disease in the right cavernous carotid artery without significant stenosis. Right anterior cerebral artery widely patent. Right M1 widely patent. Right middle cerebral artery branches widely patent without stenosis. Mild atherosclerotic calcification left cavernous carotid without stenosis. Left anterior cerebral artery widely patent. Left M1 segment is widely patent. Anterior branch of the left middle cerebral artery shows decreased density suggesting hypoperfusion. There appears to be a stenosis at the origin of this M3 branch of the left middle cerebral artery supplying the left frontotemporal lobe. Posterior circulation: Both vertebral arteries patent to the basilar. Basilar patent. PICA, superior cerebellar, posterior cerebral arteries patent without significant stenosis or occlusion. Venous sinuses: Patent Anatomic variants: None Delayed phase: Not performed Review of the MIP images confirms the above findings CT Brain Perfusion Findings: CBF (<30%) Volume: 0mL Perfusion (Tmax>6.0s) volume: 23mL Mismatch Volume: 23mL Infarction Location:Left frontotemporal lobe and left frontal operculum IMPRESSION: 23 mL volume of hypoperfusion in the left frontotemporal lobe and left frontal operculum without fixed infarct by CT perfusion Hypoperfusion of the anterior branch of left MCA supplying the above area of infarction. This appears to be a left M3 branch which may have a severe stenosis. Left M1 is widely patent. No significant vertebral or carotid artery stenosis in the neck. The images were reviewed with Dr. Amada Jupiter at 1030 hours on 05/21/2016 Electronically Signed   By: Marlan Palau M.D.   On: 05/21/2016 10:53   Mr Brain Wo Contrast  Result Date: 05/21/2016 CLINICAL DATA:  Right-sided  weakness. EXAM: MRI HEAD WITHOUT CONTRAST TECHNIQUE: Multiplanar, multiecho pulse sequences of the brain and surrounding structures were obtained without intravenous contrast. COMPARISON:  Head CT/CTA/ cerebral perfusion 05/21/2016 FINDINGS: Brain: There is an acute left lateral lenticulostriate territory infarct extending from the body of the left caudate nucleus into the posterior left lentiform nucleus and external capsule region. There is no evidence of associated hemorrhage. No mass, midline shift, or extra-axial fluid collection is seen. There is mild cerebral atrophy. Patchy T2 hyperintensities throughout the subcortical and deep cerebral white matter bilaterally are nonspecific but compatible with moderate chronic small vessel ischemic disease. There is a chronic lacunar infarct in the left caudate. Vascular: Major intracranial vascular flow voids are preserved. Skull and upper  cervical spine: Unremarkable bone marrow signal. Sinuses/Orbits: Unremarkable orbits.  No significant sinus disease. Other: None. IMPRESSION: 1. Acute left basal ganglia infarct. 2. Moderate chronic small vessel ischemic disease. Electronically Signed   By: Sebastian Ache M.D.   On: 05/21/2016 14:20   Ct Cerebral Perfusion W Contrast  Result Date: 05/21/2016 CLINICAL DATA:  Right facial and arm and leg weakness. EXAM: CT ANGIOGRAPHY HEAD AND NECK CT PERFUSION BRAIN TECHNIQUE: Multidetector CT imaging of the head and neck was performed using the standard protocol during bolus administration of intravenous contrast. Multiplanar CT image reconstructions and MIPs were obtained to evaluate the vascular anatomy. Carotid stenosis measurements (when applicable) are obtained utilizing NASCET criteria, using the distal internal carotid diameter as the denominator. Multiphase CT imaging of the brain was performed following IV bolus contrast injection. Subsequent parametric perfusion maps were calculated using RAPID software. CONTRAST:  100 mL  Isovue 370 IV COMPARISON:  CT head 05/21/2016 FINDINGS: CTA NECK FINDINGS Aortic arch: Minimal atherosclerotic disease in the aortic arch. Proximal great vessels tortuous but widely patent. Right carotid system: Right common carotid artery widely patent. Right internal carotid artery widely patent. Severe stenosis at the origin of the right external carotid artery Left carotid system: Left common carotid artery widely patent with mild atherosclerotic disease. Mild atherosclerotic calcification of the left carotid bifurcation without significant stenosis. Vertebral arteries: Both vertebral arteries patent without significant stenosis. Skeleton: Scoliosis. No acute skeletal abnormality. Minimal degenerative change in the cervical spine. Other neck: Multiple small thyroid nodules. No mass or adenopathy in the neck. Upper chest: Lung apices clear. Review of the MIP images confirms the above findings CTA HEAD FINDINGS Anterior circulation: Mild atherosclerotic disease in the right cavernous carotid artery without significant stenosis. Right anterior cerebral artery widely patent. Right M1 widely patent. Right middle cerebral artery branches widely patent without stenosis. Mild atherosclerotic calcification left cavernous carotid without stenosis. Left anterior cerebral artery widely patent. Left M1 segment is widely patent. Anterior branch of the left middle cerebral artery shows decreased density suggesting hypoperfusion. There appears to be a stenosis at the origin of this M3 branch of the left middle cerebral artery supplying the left frontotemporal lobe. Posterior circulation: Both vertebral arteries patent to the basilar. Basilar patent. PICA, superior cerebellar, posterior cerebral arteries patent without significant stenosis or occlusion. Venous sinuses: Patent Anatomic variants: None Delayed phase: Not performed Review of the MIP images confirms the above findings CT Brain Perfusion Findings: CBF (<30%) Volume:  0mL Perfusion (Tmax>6.0s) volume: 23mL Mismatch Volume: 23mL Infarction Location:Left frontotemporal lobe and left frontal operculum IMPRESSION: 23 mL volume of hypoperfusion in the left frontotemporal lobe and left frontal operculum without fixed infarct by CT perfusion Hypoperfusion of the anterior branch of left MCA supplying the above area of infarction. This appears to be a left M3 branch which may have a severe stenosis. Left M1 is widely patent. No significant vertebral or carotid artery stenosis in the neck. The images were reviewed with Dr. Amada Jupiter at 1030 hours on 05/21/2016 Electronically Signed   By: Marlan Palau M.D.   On: 05/21/2016 10:53   Dg Swallowing Func-speech Pathology  Result Date: 05/22/2016 Objective Swallowing Evaluation: Type of Study: MBS-Modified Barium Swallow Study Patient Details Name: Paige Stewart MRN: [id number] Date of Birth: 1950/11/08 Today's Date: 05/22/2016 Time: SLP Start Time (ACUTE ONLY): 1350-SLP Stop Time (ACUTE ONLY): 1415 SLP Time Calculation (min) (ACUTE ONLY): 25 min Past Medical History: Past Medical History: Diagnosis Date . Diabetes mellitus without complication (HCC)  .  Hypertension  . Neuropathy [facility])  Past Surgical History: Past Surgical History: Procedure Laterality Date . CESAREAN SECTION   HPI: Bryana Froemming a 66 y.o.femalepresenting with right sided weakness, right facial droop, dysarthria. PMH is significant for DM2 ,HTN, diabetic neuropathy. CT head without hemorrhage, CT perfusion with small area of penumbra in the anterior MCA per neurology. MRI 05/21/16 showed acute left basal ganglia infarct, moderate chronic small vessel ischemic disease. No prior swallowing evaluations in chart. Failed nursing stroke swallow screen. Subjective: Pt alert, not communicating verbally Assessment / Plan / Recommendation CHL IP CLINICAL IMPRESSIONS 05/22/2016 Clinical Impression Pt presents with a moderate oral, mild pharyngeal dysphagia marked by reduced  sensorimotor function - this leads to poor bolus cohesion and propulsion, anterior loss right side, right oral residue.  Swallow onset is delayed, particularly with thin liquids, which reach the pyriforms and then are aspirated during the swallow (accompanied by cough).  A chin tuck effectively prevented aspiration, but pt required significant cues to help her to carry-out chin tuck. There was good pharyngeal clearance of all POs.  Esophageal sweep revealed barium retention in the distal esophagus which remained throughout the study.  For now, recommend initiating a dysphagia 1 diet with nectar thick liquids.  Outside of meals, pt may have sips of thin liquid if she has 1:1 cueing and assist to tuck her chin.  Meds may be given whole in puree.  SLP will follow for safety/diet progression and pending speech language evaluation.  Pt's dtr present for exam and results/recs were reviewed.  SLP Visit Diagnosis Dysphagia, oropharyngeal phase (R13.12) Attention and concentration deficit following -- Frontal lobe and executive function deficit following -- Impact on safety and function Moderate aspiration risk   [facility] IP TREATMENT RECOMMENDATION 05/22/2016 Treatment Recommendations Therapy as outlined in treatment plan below   Prognosis 05/22/2016 Prognosis for Safe Diet Advancement Good Barriers to Reach Goals -- Barriers/Prognosis Comment -- [facility] IP DIET RECOMMENDATION 05/22/2016 SLP Diet Recommendations Dysphagia 1 (Puree) solids;Nectar thick liquid Liquid Administration via Cup Medication Administration Whole meds with puree Compensations Minimize environmental distractions;Slow rate;Small sips/bites;Lingual sweep for clearance of pocketing Postural Changes Remain semi-upright after after feeds/meals (Comment)   [facility] IP OTHER RECOMMENDATIONS 05/22/2016 Recommended Consults -- Oral Care Recommendations Oral care BID Other Recommendations Remove water pitcher;Have oral suction available   [facility] IP FOLLOW UP RECOMMENDATIONS 05/22/2016  Follow up Recommendations (No Data)   [facility] IP FREQUENCY AND DURATION 05/22/2016 Speech Therapy Frequency (ACUTE ONLY) min 3x week Treatment Duration 2 weeks      [facility]L IP ORAL PHASE 05/22/2016 Oral Phase Impaired Oral - Pudding Teaspoon -- Oral - Pudding Cup -- Oral - Honey Teaspoon -- Oral - Honey Cup -- Oral - Nectar Teaspoon -- Oral - Nectar Cup Right anterior bolus loss;Weak lingual manipulation;Incomplete tongue to palate contact;Reduced posterior propulsion;Delayed oral transit Oral - Nectar Straw -- Oral - Thin Teaspoon -- Oral - Thin Cup Right anterior bolus loss;Weak lingual manipulation;Incomplete tongue to palate contact;Reduced posterior propulsion;Delayed oral transit Oral - Thin Straw -- Oral - Puree Weak lingual manipulation;Incomplete tongue to palate contact;Reduced posterior propulsion;Right anterior bolus loss;Delayed oral transit Oral - Mech Soft Right anterior bolus loss;Weak lingual manipulation;Incomplete tongue to palate contact;Reduced posterior propulsion;Delayed oral transit Oral - Regular -- Oral - Multi-Consistency -- Oral - Pill -- Oral Phase - Comment --  [facility]L IP PHARYNGEAL PHASE 05/22/2016 Pharyngeal Phase Impaired Pharyngeal- Pudding Teaspoon -- Pharyngeal -- Pharyngeal- Pudding Cup -- Pharyngeal -- Pharyngeal- Honey Teaspoon -- Pharyngeal -- Pharyngeal- Honey Cup --  Pharyngeal -- Pharyngeal- Nectar Teaspoon -- Pharyngeal -- Pharyngeal- Nectar Cup Delayed swallow initiation-vallecula Pharyngeal -- Pharyngeal- Nectar Straw -- Pharyngeal -- Pharyngeal- Thin Teaspoon -- Pharyngeal -- Pharyngeal- Thin Cup Delayed swallow initiation-pyriform sinuses;Reduced airway/laryngeal closure;Penetration/Aspiration during swallow;Trace aspiration Pharyngeal Material enters airway, passes BELOW cords and not ejected out despite cough attempt by patient Pharyngeal- Thin Straw -- Pharyngeal -- Pharyngeal- Puree Delayed swallow initiation-vallecula Pharyngeal -- Pharyngeal- Mechanical Soft Delayed swallow  initiation-vallecula Pharyngeal -- Pharyngeal- Regular -- Pharyngeal -- Pharyngeal- Multi-consistency -- Pharyngeal -- Pharyngeal- Pill -- Pharyngeal -- Pharyngeal Comment --  CHL IP CERVICAL ESOPHAGEAL PHASE 05/22/2016 Cervical Esophageal Phase (No Data) Pudding Teaspoon -- Pudding Cup -- Honey Teaspoon -- Honey Cup -- Nectar Teaspoon -- Nectar Cup -- Nectar Straw -- Thin Teaspoon -- Thin Cup -- Thin Straw -- Puree -- Mechanical Soft -- Regular -- Multi-consistency -- Pill -- Cervical Esophageal Comment -- No flowsheet data found. [name] [name] 05/22/2016, 2:36 PM              Ct Angio Chest/abd/pel For Dissection W And/or W/wo  Result Date: 05/21/2016 CLINICAL DATA:  Hypertension. Found down this morning with right facial droop, aphasia and right sided weakness. EXAM: CT ANGIOGRAPHY CHEST, ABDOMEN AND PELVIS TECHNIQUE: Multidetector CT imaging through the chest, abdomen and pelvis was performed using the standard protocol during bolus administration of intravenous contrast. Multiplanar reconstructed images and MIPs were obtained and reviewed to evaluate the vascular anatomy. CONTRAST:  75 cc Isovue 370 IV. COMPARISON:  05/04/2016 CT abdomen/ pelvis. FINDINGS: CTA CHEST FINDINGS Cardiovascular: Top-normal heart size. Small to moderate pericardial effusion/thickening, mildly increased. Left anterior descending, left circumflex and right coronary atherosclerosis. Atherosclerotic nonaneurysmal thoracic aorta. No thoracic aortic intramural hematoma, dissection, pseudoaneurysm or penetrating atherosclerotic ulcer. Common origin of the brachiocephalic and left common carotid artery is from the aortic arch. Patent visualized proximal vertebral arteries. Atherosclerotic aortic arch branch vessels with no significant stenoses. Normal caliber pulmonary arteries. No central pulmonary emboli. Mediastinum/Nodes: Subcentimeter hypodense anterior right thyroid lobe nodule. Unremarkable esophagus. No pathologically  enlarged axillary, mediastinal or hilar lymph nodes. Lungs/Pleura: No pneumothorax. Trace dependent bilateral pleural effusions. Hypoventilatory changes in the dependent lungs. No acute consolidative airspace disease, lung masses or significant pulmonary nodules in the aerated portions of the lungs. Musculoskeletal: No aggressive appearing focal osseous lesions. Mild thoracic spondylosis Review of the MIP images confirms the above findings. CTA ABDOMEN AND PELVIS FINDINGS VASCULAR Aorta: Atherosclerotic nonaneurysmal abdominal aorta. No evidence of dissection, vasculitis or significant stenosis. Celiac: Patent without evidence of aneurysm, dissection, vasculitis or significant stenosis. SMA: Patent without evidence of aneurysm, dissection, vasculitis or significant stenosis. Renals: Single renal arteries bilaterally. Approximately 50% proximal right renal artery stenosis due to the atherosclerotic plaque. No significant left renal artery stenosis. IMA: Approximately 60% proximal IMA stenosis due to atherosclerotic plaque. Inflow: Patent without evidence of aneurysm, dissection, vasculitis or significant stenosis. Veins: No obvious venous abnormality within the limitations of this arterial phase study. Review of the MIP images confirms the above findings. NON-VASCULAR Hepatobiliary: Normal liver with no liver mass. Normal gallbladder with no radiopaque cholelithiasis. No biliary ductal dilatation. Pancreas: Normal, with no mass or duct dilation. Spleen: Normal size. No mass. Adrenals/Urinary Tract: Normal adrenals. Symmetric contrast nephrograms. No hydronephrosis. No renal mass. 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 Normal bladder. Stomach/Bowel: Small hiatal hernia. Otherwise collapsed and grossly normal  stomach. Normal caliber small bowel with no small bowel wall thickening. Normal  appendix. Normal large bowel with no diverticulosis, large bowel wall thickening or pericolonic fat stranding. Vascular/Lymphatic: Atherosclerotic nonaneurysmal abdominal aorta. No pathologically enlarged lymph nodes in the abdomen or pelvis. Reproductive: Grossly normal uterus. Contrast in the vagina is probably due to retrograde filling from urinary incontinence. No adnexal mass. Other: No pneumoperitoneum, ascites or focal fluid collection. Musculoskeletal: No aggressive appearing focal osseous lesions. Review of the MIP images confirms the above findings. IMPRESSION: 1. No acute aortic syndrome. 2. Small to moderate pericardial effusion, mildly increased since 05/04/2016. Consider echocardiographic correlation. 3. Trace dependent bilateral pleural effusions. No acute pulmonary disease. 4. No acute abnormality in the abdomen or pelvis. 5. Three-vessel coronary atherosclerosis. 6. 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 7. Small hiatal hernia. Electronically Signed   By: Delbert Phenix M.D.   On: 05/21/2016 11:20   Ct Head Code Stroke Wo Contrast`  Result Date: 05/21/2016 CLINICAL DATA:  Code stroke.  Right-sided weakness EXAM: CT HEAD WITHOUT CONTRAST TECHNIQUE: Contiguous axial images were obtained from the base of the skull through the vertex without intravenous contrast. COMPARISON:  None. FINDINGS: Brain: Generalized atrophy. Low-density throughout the cerebral white matter bilaterally appears chronic. Negative for acute infarct. Negative for hemorrhage or mass lesion. No shift of the midline structures. Vascular: Negative for hyperdense vessel Skull: Negative Sinuses/Orbits: Mild mucosal edema left maxillary sinus. Normal orbit. Other: None ASPECTS (Alberta Stroke Program Early CT Score) - Ganglionic level infarction (caudate, lentiform nuclei, internal capsule, insula, M1-M3 cortex): 7 - Supraganglionic infarction (M4-M6 cortex): 3 Total score (0-10 with  10 being normal): 10 IMPRESSION: 1. Negative for acute infarct 2. ASPECTS is 10 3. Atrophy and moderate chronic microvascular ischemic change in the white matter I reviewed the images with Dr. Amada Jupiter at 1015 hours 05/21/2016. Electronically Signed   By: Marlan Palau M.D.   On: 05/21/2016 10:30    Cardiac Studies   ECHO Pending  Patient Profile     66 y.o. female with demand ischemia (elevated troponin in the setting of stroke) with abnormal ECG, no angina, normal CT angiogram (no dissection) in the setting of acute stroke (right sided weakness, aphasia)  Assessment & Plan    Demand ischemia  - troponin elevated in the setting of CVA and severe hypertension (likely in part supply demand mismatch), no anginal symptoms.   - ECG TWI are often seen in stroke as well. ST changes mild inferior j point 1mm changes. TWI biphasic in V2.   - CTA reassuring no dissection.   - No invasive workup at this time. As she heals from CVA, several weeks down the road, we could consider outpatient NUC stress.   - Statin, metoprolol, ASA (Plavix if able to take from neuro perspective)  - Avoiding IV heparin because of risk of hemorrhagic transformation  - No change today in management.   Hypertension  - reported non compliance with meds at home  - improved here with elevation in the setting of ischemic CVA.   - Permissive HTN --post CVA  Acute stroke  - per primary team/neuro  - TEE requested.    Signed, Donato Schultz, MD  05/23/2016, 9:16 AM

## 2016-05-24 ENCOUNTER — Encounter (HOSPITAL_COMMUNITY): Payer: Self-pay | Admitting: Physical Medicine and Rehabilitation

## 2016-05-24 DIAGNOSIS — I638 Other cerebral infarction: Secondary | ICD-10-CM

## 2016-05-24 DIAGNOSIS — E119 Type 2 diabetes mellitus without complications: Secondary | ICD-10-CM

## 2016-05-24 DIAGNOSIS — I248 Other forms of acute ischemic heart disease: Secondary | ICD-10-CM

## 2016-05-24 DIAGNOSIS — G8100 Flaccid hemiplegia affecting unspecified side: Secondary | ICD-10-CM

## 2016-05-24 DIAGNOSIS — N390 Urinary tract infection, site not specified: Secondary | ICD-10-CM

## 2016-05-24 LAB — CBC
HCT: 36 % (ref 36.0–46.0)
Hemoglobin: 12.3 g/dL (ref 12.0–15.0)
MCH: 29.9 pg (ref 26.0–34.0)
MCHC: 34.2 g/dL (ref 30.0–36.0)
MCV: 87.4 fL (ref 78.0–100.0)
Platelets: 243 10*3/uL (ref 150–400)
RBC: 4.12 MIL/uL (ref 3.87–5.11)
RDW: 13.2 % (ref 11.5–15.5)
WBC: 4.1 10*3/uL (ref 4.0–10.5)

## 2016-05-24 LAB — BASIC METABOLIC PANEL
Anion gap: 6 (ref 5–15)
BUN: 9 mg/dL (ref 6–20)
CO2: 22 mmol/L (ref 22–32)
Calcium: 8.6 mg/dL — ABNORMAL LOW (ref 8.9–10.3)
Chloride: 109 mmol/L (ref 101–111)
Creatinine, Ser: 0.69 mg/dL (ref 0.44–1.00)
GFR calc Af Amer: >60 mL/min (ref >60)
GFR calc non Af Amer: >60 mL/min (ref >60)
Glucose, Bld: 220 mg/dL — ABNORMAL HIGH (ref 65–99)
Potassium: 4 mmol/L (ref 3.5–5.1)
Sodium: 137 mmol/L (ref 135–145)

## 2016-05-24 LAB — GLUCOSE, CAPILLARY
GLUCOSE-CAPILLARY: 215 mg/dL — AB (ref 65–99)
GLUCOSE-CAPILLARY: 192 mg/dL — AB (ref 65–99)
GLUCOSE-CAPILLARY: 197 mg/dL — AB (ref 65–99)
Glucose-Capillary: 226 mg/dL — ABNORMAL HIGH (ref 65–99)
Glucose-Capillary: 261 mg/dL — ABNORMAL HIGH (ref 65–99)

## 2016-05-24 MED ORDER — ACETAMINOPHEN 325 MG PO TABS
650.0000 mg | ORAL_TABLET | Freq: Four times a day (QID) | ORAL | Status: DC | PRN
  Administered 2016-05-24 – 2016-05-25 (×2): 650 mg via ORAL
  Filled 2016-05-24 (×2): qty 2

## 2016-05-24 MED ORDER — METOPROLOL TARTRATE 25 MG PO TABS
25.0000 mg | ORAL_TABLET | Freq: Two times a day (BID) | ORAL | Status: DC
  Administered 2016-05-24: 25 mg via ORAL
  Filled 2016-05-24: qty 1

## 2016-05-24 MED ORDER — CLOPIDOGREL BISULFATE 75 MG PO TABS
75.0000 mg | ORAL_TABLET | Freq: Every day | ORAL | Status: DC
  Administered 2016-05-24 – 2016-05-25 (×2): 75 mg via ORAL
  Filled 2016-05-24 (×2): qty 1

## 2016-05-24 MED ORDER — INSULIN NPH (HUMAN) (ISOPHANE) 100 UNIT/ML ~~LOC~~ SUSP
5.0000 [IU] | Freq: Two times a day (BID) | SUBCUTANEOUS | Status: DC
  Administered 2016-05-24 – 2016-05-25 (×2): 5 [IU] via SUBCUTANEOUS
  Filled 2016-05-24 (×2): qty 10

## 2016-05-24 MED ORDER — HYDROCHLOROTHIAZIDE 25 MG PO TABS
25.0000 mg | ORAL_TABLET | Freq: Every day | ORAL | Status: DC
  Administered 2016-05-25: 25 mg via ORAL
  Filled 2016-05-24: qty 1

## 2016-05-24 MED ORDER — CIPROFLOXACIN HCL 500 MG PO TABS
500.0000 mg | ORAL_TABLET | Freq: Two times a day (BID) | ORAL | Status: DC
  Administered 2016-05-24 – 2016-05-25 (×2): 500 mg via ORAL
  Filled 2016-05-24 (×3): qty 1

## 2016-05-24 MED ORDER — SIMETHICONE 80 MG PO CHEW
80.0000 mg | CHEWABLE_TABLET | Freq: Four times a day (QID) | ORAL | Status: DC | PRN
Start: 2016-05-24 — End: 2016-05-25
  Administered 2016-05-24: 80 mg via ORAL
  Filled 2016-05-24: qty 1

## 2016-05-24 MED ORDER — METOPROLOL SUCCINATE ER 25 MG PO TB24: 25.0000 mg | ORAL_TABLET | Freq: Every day | ORAL | Status: DC

## 2016-05-24 MED ORDER — INSULIN NPH (HUMAN) (ISOPHANE) 100 UNIT/ML ~~LOC~~ SUSP: 5.0000 [IU] | Freq: Two times a day (BID) | SUBCUTANEOUS | Status: DC

## 2016-05-24 MED ORDER — GABAPENTIN 300 MG PO CAPS
300.0000 mg | ORAL_CAPSULE | Freq: Two times a day (BID) | ORAL | Status: DC
  Administered 2016-05-24 – 2016-05-25 (×3): 300 mg via ORAL
  Filled 2016-05-24 (×3): qty 1

## 2016-05-24 NOTE — Progress Notes (Addendum)
qPhysical Therapy Treatment Patient Details Name: Missi Mcmackin MRN: [id number] DOB: January 16, 1951 Today's Date: 05/24/2016    History of Present Illness Pt is a 66 y.o. female admitted to ED on 05/21/2016 with R-side weakness, R facial droop, and dysarthria; pt from [place] and was visiting family in [place]. CT angio shows hypoperfusion in L frontotemporal lobe, L frontal operculum , and anterior brance of L MCA. TEE on 4/3 shows no intracardiac source of embolism. 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 Pertinent PMH includes DM, HTN, peripheral neuropathy.     PT Comments    Pt performed increased mobility and performed multiple standing trials in stedy frame with assist for trunk and R upper extremity.  Pt with trace movements of R UE/LE.  Pt benefited from stedy frame to block R knee and encourage weight bearing.   Plan next session to stand with RW if able.    Follow Up Recommendations  CIR;Supervision/Assistance - 24 hour     Equipment Recommendations  Other (comment) (Defer to next venue)    Recommendations for Other Services Rehab consult;OT consult     Precautions / Restrictions Precautions Precautions: Fall Restrictions Weight Bearing Restrictions: No    Mobility  Bed Mobility Overal bed mobility: Needs Assistance Bed Mobility: Rolling;Sidelying to Sit Rolling: Mod assist Sidelying to sit: Max assist;+2 for physical assistance       General bed mobility comments: Cues for hand placement on bed rail for rolling. Assist for LEs to EOB and for trunk elevation to sit. Assist to scoot hips out to EOB with use of bed pad, 2nd person steadying assist due to poor sitting balance.  Transfers Overall transfer level: Needs assistance Equipment used: Ambulation equipment used (stedy) Transfers: Sit to/from [employer] Sit to Stand: Mod assist;+2 physical assistance Stand pivot transfers: Total assist;+2 physical assistance       General transfer comment: Mod  assist +2 for initial sit to stand from EOB with use of Stedy; assist for R UE and to boost up. From [facility] pt able to stand with mod assist +1 for RUE support. Total assist for pivot transfer to chair in [place].  Ambulation/Gait                 Stairs            Wheelchair Mobility    Modified Rankin (Stroke Patients Only) Modified Rankin (Stroke Patients Only) Pre-Morbid Rankin Score: No symptoms Modified Rankin: Severe disability     Balance Overall balance assessment: Needs assistance Sitting-balance support: Single extremity supported;Feet supported Sitting balance-Leahy Scale: Poor Sitting balance - Comments: Max-min assist for sitting balance. Significant R lateral lean. Postural control: Right lateral lean Standing balance support: Bilateral upper extremity supported Standing balance-Leahy Scale: Zero Standing balance comment: in Stedy                            Cognition Arousal/Alertness: Awake/alert Behavior During Therapy: Flat affect Overall Cognitive Status: Difficult to assess Area of Impairment: Attention;Following commands;Problem solving;Safety/judgement                   Current Attention Level: Sustained   Following Commands: Follows one step commands inconsistently;Follows one step commands with increased time Safety/Judgement: Decreased awareness of deficits Awareness: Intellectual Problem Solving: Slow processing;Decreased initiation;Requires verbal cues;Requires tactile cues General Comments: Pt inconsistently responding to questions and following commands. Pt able to verbalize her full name. Nodding yes/no to questions at start of  session.      Exercises Other Exercises Other Exercises: repeated sit to stands x10 from elevated height of stedy frame plates.      General Comments        Pertinent Vitals/Pain Pain Assessment: Faces Faces Pain Scale: Hurts little more Pain Location: LLE Pain Descriptors /  Indicators: Discomfort Pain Intervention(s): Monitored during session;Repositioned    Home Living Family/patient expects to be discharged to:: Unsure Living Arrangements: Children Available Help at Discharge: Family Type of Home: Apartment Home Access: Stairs to enter       Additional Comments: Pt from [place], visiting daughter in [place]; daughter lives in 3rd floor apartment.     Prior Function Level of Independence: Independent      Comments: Per daughter, pt independent with ADL PTA.   PT Goals (current goals can now be found in the care plan section) Acute Rehab PT Goals Patient Stated Goal: none stated PT Goal Formulation: Patient unable to participate in goal setting Progress towards PT goals: Progressing toward goals    Frequency    Min 3X/week      PT Plan Current plan remains appropriate    Co-evaluation PT/OT/SLP Co-Evaluation/Treatment: Yes Reason for Co-Treatment: Complexity of the patient's impairments (multi-system involvement);For patient/therapist safety PT goals addressed during session: Mobility/safety with mobility;Strengthening/ROM OT goals addressed during session: ADL's and self-care     End of Session Equipment Utilized During Treatment: Gait belt Activity Tolerance: Patient limited by lethargy Patient left: in bed;with call bell/phone within reach;with family/visitor present Nurse Communication: Mobility status PT Visit Diagnosis: Other abnormalities of gait and mobility (R26.89);Hemiplegia and 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

## 2016-05-24 NOTE — Progress Notes (Signed)
Rehab Admissions Coordinator Note:  Patient was screened by Trish Mage for appropriateness for an Inpatient Acute Rehab Consult.  At this time, we are recommending Inpatient Rehab consult.  Trish Mage 05/24/2016, 8:53 AM  I can be reached at [phone].

## 2016-05-24 NOTE — Progress Notes (Signed)
Progress Note  Patient Name: Paige Stewart Date of Encounter: 05/24/2016  Primary Cardiologist: Anne Fu  Subjective   Denies SOB, no CP, maybe speech mildly improved.   Inpatient Medications    Scheduled Meds: . aspirin  81 mg Oral Daily  . atorvastatin  40 mg Oral q1800  . cephALEXin  500 mg Oral Q12H  . hydrochlorothiazide  12.5 mg Oral Daily  . insulin aspart  0-9 Units Subcutaneous TID WC  . lisinopril  10 mg Oral Daily  . metoprolol tartrate  25 mg Oral BID   Continuous Infusions: . sodium chloride 100 mL/hr at 05/23/16 2300   PRN Meds: food thickener, hydrALAZINE, senna-docusate   Vital Signs    Vitals:   05/23/16 1814 05/23/16 2012 05/24/16 0730 05/24/16 0810  BP:  (!) 161/90 (!) 160/90 (!) 163/94  Pulse:  83 78 87  Resp:  Temp: 98.7 F (37.1 C) 98.9 F (37.2 C)    TempSrc: Oral Oral    SpO2:  99% 100% 98%  Weight:        Intake/Output Summary (Last 24 hours) at 05/24/16 0844 Last data filed at 05/23/16 2300  Gross per 24 hour  Intake             1700 ml  Output                0 ml  Net             1700 ml   Filed Weights   05/21/16 0951 05/22/16 0548 05/23/16 0409  Weight: 148 lb (67.1 kg) 146 lb 12.8 oz (66.6 kg) 146 lb 8 oz (66.5 kg)    Telemetry    No adverse rhythms no changes, no afib- Personally Reviewed  ECG    NSR with TWI noted - Personally Reviewed  Physical Exam   GEN: No acute distress.  Laying in bed Neck: No JVD Cardiac: RRR no murmurs, rubs, or gallops.  Respiratory: Clear to auscultation bilaterally. GI: Soft, nontender, non-distended obese MS: No edema; No deformity. Neuro:  right weakness, aphasia  Psych: Normal affect   Labs    Chemistry  Recent Labs Lab 05/21/16 0953  05/22/16 0006 05/23/16 0511 05/24/16 0512  NA 137  < > 139 137 137  K 3.8  < > 3.5 3.0* 4.0  CL 101  < > 105 105 109  CO2 27  --  GLUCOSE 254*  < > 183* 155* 220*  BUN 14  < > CREATININE 0.76  < > 0.71  0.64 0.69  CALCIUM 9.3  --  9.0 8.7* 8.6*  PROT 7.2  --   --   --   --   ALBUMIN 3.9  --   --   --   --   AST 21  --   --   --   --   ALT 17  --   --   --   --   ALKPHOS 51  --   --   --   --   BILITOT 0.4  --   --   --   --   GFRNONAA >60  --  >60 >60 >60  GFRAA >60  --  >60 >60 >60  ANIONGAP 9  --  < > = values in this interval not displayed.   Hematology  Recent Labs Lab 05/22/16 0006 05/23/16 0511 05/24/16 0512  WBC 4.7 6.3  4.1  RBC 4.23 4.30 4.12  HGB 12.9 13.1 12.3  HCT 36.6 37.1 36.0  MCV 86.5 86.3 87.4  MCH 30.5 30.5 29.9  MCHC 35.2 35.3 34.2  RDW 13.4 13.4 13.2  PLT 232 258 243    Cardiac Enzymes  Recent Labs Lab 05/22/16 0006 05/22/16 0604 05/22/16 1235 05/22/16 1752  TROPONINI 4.13* 3.94* 4.12* 4.00*     Recent Labs Lab 05/21/16 1000  TROPIPOC 1.91*     BNPNo results for input(s): BNP, PROBNP in the last 168 hours.   DDimer No results for input(s): DDIMER in the last 168 hours.   Radiology    Dg Swallowing Func-speech Pathology  Result Date: 05/22/2016 Objective Swallowing Evaluation: Type of Study: MBS-Modified Barium Swallow Study Patient Details Name: Paige Stewart MRN: [id number] Date of Birth: 1951/01/21 Today's Date: 05/22/2016 Time: SLP Start Time (ACUTE ONLY): 1350-SLP Stop Time (ACUTE ONLY): 1415 SLP Time Calculation (min) (ACUTE ONLY): 25 min Past Medical History: Past Medical History: Diagnosis Date . Diabetes mellitus without complication (HCC)  . Hypertension  . Neuropathy [facility])  Past Surgical History: Past Surgical History: Procedure Laterality Date . CESAREAN SECTION   HPI: Paige Stewart a 66 y.o.femalepresenting with right sided weakness, right facial droop, dysarthria. PMH is significant for DM2 ,HTN, diabetic neuropathy. CT head without hemorrhage, CT perfusion with small area of penumbra in the anterior MCA per neurology. MRI 05/21/16 showed acute left basal ganglia infarct, moderate chronic small vessel ischemic disease.  No prior swallowing evaluations in chart. Failed nursing stroke swallow screen. Subjective: Pt alert, not communicating verbally Assessment / Plan / Recommendation CHL IP CLINICAL IMPRESSIONS 05/22/2016 Clinical Impression Pt presents with a moderate oral, mild pharyngeal dysphagia marked by reduced sensorimotor function - this leads to poor bolus cohesion and propulsion, anterior loss right side, right oral residue.  Swallow onset is delayed, particularly with thin liquids, which reach the pyriforms and then are aspirated during the swallow (accompanied by cough).  A chin tuck effectively prevented aspiration, but pt required significant cues to help her to carry-out chin tuck. There was good pharyngeal clearance of all POs.  Esophageal sweep revealed barium retention in the distal esophagus which remained throughout the study.  For now, recommend initiating a dysphagia 1 diet with nectar thick liquids.  Outside of meals, pt may have sips of thin liquid if she has 1:1 cueing and assist to tuck her chin.  Meds may be given whole in puree.  SLP will follow for safety/diet progression and pending speech language evaluation.  Pt's dtr present for exam and results/recs were reviewed.  SLP Visit Diagnosis Dysphagia, oropharyngeal phase (R13.12) Attention and concentration deficit following -- Frontal lobe and executive function deficit following -- Impact on safety and function Moderate aspiration risk   CHL IP TREATMENT RECOMMENDATION 05/22/2016 Treatment Recommendations Therapy as outlined in treatment plan below   Prognosis 05/22/2016 Prognosis for Safe Diet Advancement Good Barriers to Reach Goals -- Barriers/Prognosis Comment -- CHL IP DIET RECOMMENDATION 05/22/2016 SLP Diet Recommendations Dysphagia 1 (Puree) solids;Nectar thick liquid Liquid Administration via Cup Medication Administration Whole meds with puree Compensations Minimize environmental distractions;Slow rate;Small sips/bites;Lingual sweep for clearance of  pocketing Postural Changes Remain semi-upright after after feeds/meals (Comment)   CHL IP OTHER RECOMMENDATIONS 05/22/2016 Recommended Consults -- Oral Care Recommendations Oral care BID Other Recommendations Remove water pitcher;Have oral suction available   CHL IP FOLLOW UP RECOMMENDATIONS 05/22/2016 Follow up Recommendations (No Data)   CHL IP FREQUENCY AND DURATION 05/22/2016 Speech Therapy Frequency (ACUTE ONLY) min  3x week Treatment Duration 2 weeks      CHL IP ORAL PHASE 05/22/2016 Oral Phase Impaired Oral - Pudding Teaspoon -- Oral - Pudding Cup -- Oral - Honey Teaspoon -- Oral - Honey Cup -- Oral - Nectar Teaspoon -- Oral - Nectar Cup Right anterior bolus loss;Weak lingual manipulation;Incomplete tongue to palate contact;Reduced posterior propulsion;Delayed oral transit Oral - Nectar Straw -- Oral - Thin Teaspoon -- Oral - Thin Cup Right anterior bolus loss;Weak lingual manipulation;Incomplete tongue to palate contact;Reduced posterior propulsion;Delayed oral transit Oral - Thin Straw -- Oral - Puree Weak lingual manipulation;Incomplete tongue to palate contact;Reduced posterior propulsion;Right anterior bolus loss;Delayed oral transit Oral - Mech Soft Right anterior bolus loss;Weak lingual manipulation;Incomplete tongue to palate contact;Reduced posterior propulsion;Delayed oral transit Oral - Regular -- Oral - Multi-Consistency -- Oral - Pill -- Oral Phase - Comment --  CHL IP PHARYNGEAL PHASE 05/22/2016 Pharyngeal Phase Impaired Pharyngeal- Pudding Teaspoon -- Pharyngeal -- Pharyngeal- Pudding Cup -- Pharyngeal -- Pharyngeal- Honey Teaspoon -- Pharyngeal -- Pharyngeal- Honey Cup -- Pharyngeal -- Pharyngeal- Nectar Teaspoon -- Pharyngeal -- Pharyngeal- Nectar Cup Delayed swallow initiation-vallecula Pharyngeal -- Pharyngeal- Nectar Straw -- Pharyngeal -- Pharyngeal- Thin Teaspoon -- Pharyngeal -- Pharyngeal- Thin Cup Delayed swallow initiation-pyriform sinuses;Reduced airway/laryngeal closure;Penetration/Aspiration  during swallow;Trace aspiration Pharyngeal Material enters airway, passes BELOW cords and not ejected out despite cough attempt by patient Pharyngeal- Thin Straw -- Pharyngeal -- Pharyngeal- Puree Delayed swallow initiation-vallecula Pharyngeal -- Pharyngeal- Mechanical Soft Delayed swallow initiation-vallecula Pharyngeal -- Pharyngeal- Regular -- Pharyngeal -- Pharyngeal- Multi-consistency -- Pharyngeal -- Pharyngeal- Pill -- Pharyngeal -- Pharyngeal Comment --  CHL IP CERVICAL ESOPHAGEAL PHASE 05/22/2016 Cervical Esophageal Phase (No Data) Pudding Teaspoon -- Pudding Cup -- Honey Teaspoon -- Honey Cup -- Nectar Teaspoon -- Nectar Cup -- Nectar Straw -- Thin Teaspoon -- Thin Cup -- Thin Straw -- Puree -- Mechanical Soft -- Regular -- Multi-consistency -- Pill -- Cervical Esophageal Comment -- No flowsheet data found. [name] [name] 05/22/2016, 2:36 PM               Cardiac Studies   ECHO  - Left ventricle: The cavity size was normal. There was mild focal   basal hypertrophy of the septum. Systolic function was mildly to   moderately reduced. The estimated ejection fraction was in the   range of 40% to 45%. Severe hypokinesis of the   mid-apicalanteroseptal and anterior myocardium; consistent with   ischemia in the distribution of the left anterior descending   coronary artery. Akinesis of the apical myocardium. There was   fusion of early and atrial contributions to ventricular filling.   The study is not technically sufficient to allow evaluation of LV   diastolic function. No evidence of thrombus. - Atrial septum: Echo contrast study showed no right-to-left atrial   level shunt, at baseline or with provocation. - Pericardium, extracardiac: A small pericardial effusion was   identified anterior to the heart. The fluid had no internal   echoes.There was no evidence of hemodynamic compromise.  Patient Profile     66 y.o. female with demand ischemia (elevated troponin in the setting of  stroke) with abnormal ECG, no angina, normal CT angiogram (no dissection) in the setting of acute stroke (right sided weakness, aphasia)  Assessment & Plan    Demand ischemia  - EF is mildly reduced 45% with anteroseptal hypokinesis. ? If this is new, do not have old echo to compare. 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 Nonetheless, with recent CVA, not an invasive  candidate. Continue to treat with aggressive secondary prevention, metoprolol, ASA, high intensity statin. Agree with further titration of BP meds. ACE-I, Bb.   - Would convert metoprolol to ER or Toprol given EF reduction.   - Please add Clopidogrel  QD if OK with neurology.   - As she heals from CVA, several weeks down the road, we could consider outpatient NUC stress to see if ischemia is present in anteroseptal region.   - Avoided IV heparin because of risk of hemorrhagic transformation  Hypertension  - reported non compliance with meds at home  - improved here with elevation in the setting of ischemic CVA.   - Permissive HTN --post CVA.  - Continue to increase antihypertensives.   Acute stroke  - per primary team/neuro  - TEE - no thrombus. No embolic source.   - No afib on monitor.    Please let us know if we can be of further assistance. Will sign off.   Signed, Donato Schultz, MD  05/24/2016, 8:44 AM

## 2016-05-24 NOTE — Evaluation (Signed)
Occupational Therapy Evaluation Patient Details Name: Paige Stewart MRN: [id number] DOB: [birth year] Today's Date: 05/24/2016    History of Present Illness Pt is a 66 y.o. female admitted to ED on 05/21/2016 with R-side weakness, R facial droop, and dysarthria; pt from [place] and was visiting family in [place]. CT angio shows hypoperfusion in L frontotemporal lobe, L frontal operculum , and anterior brance of L MCA. TEE on 4/3 shows no intracardiac source of embolism. 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 Pertinent PMH includes DM, HTN, peripheral neuropathy.    Clinical Impression   Per family, pt independent with ADL and mobility PTA. Currently pt overall mod assist +2 for sit to stand with use of Stedy and mod-max assist for ADL. Pt presenting with impaired communication and cognition, limited RUE AROM/strength/sensation, poor sitting and standing balance, and ?visual deficits impacting her independence and safety with ADL and functional mobility. Pt motivated and willing to participate in therapy with supportive family present during session. Recommending CIR level therapies to maximize independence and safety with ADL and functional mobility prior to return home. Pt would benefit from continued skilled OT to address established goals.    Follow Up Recommendations  CIR;Supervision/Assistance - 24 hour    Equipment Recommendations  Other (comment) (TBD at next venue)    Recommendations for Other Services       Precautions / Restrictions Precautions Precautions: Fall Restrictions Weight Bearing Restrictions: No      Mobility Bed Mobility Overal bed mobility: Needs Assistance Bed Mobility: Rolling;Sidelying to Sit Rolling: Mod assist Sidelying to sit: Max assist;+2 for physical assistance       General bed mobility comments: Cues for hand placement on bed rail for rolling. Assist for LEs to EOB and for trunk elevation to sit. Assist to scoot hips out to EOB with use of bed pad, 2nd  person steadying assist due to poor sitting balance.  Transfers Overall transfer level: Needs assistance Equipment used: Ambulation equipment used Antony Salmon) Transfers: Sit to/from [employer] to Stand: Mod assist;+2 physical assistance Stand pivot transfers: Total assist;+2 physical assistance       General transfer comment: Mod assist +2 for initial sit to stand from EOB with use of Stedy; assist for R UE and to boost up. From [facility] pt able to stand with mod assist +1 for RUE support. Total assist for pivot transfer to chair in [place].    Balance Overall balance assessment: Needs assistance Sitting-balance support: Single extremity supported;Feet supported Sitting balance-Leahy Scale: Poor Sitting balance - Comments: Max-min assist for sitting balance. Significant R lateral lean. Postural control: Right lateral lean Standing balance support: Bilateral upper extremity supported Standing balance-Leahy Scale: Zero Standing balance comment: in Stedy                           ADL either performed or assessed with clinical judgement   ADL Overall ADL's : Needs assistance/impaired Eating/Feeding: Minimal assistance;Sitting Eating/Feeding Details (indicate cue type and reason): Pt able to take sips of water using L hand during grooming activity. Grooming: Moderate assistance;Sitting;Wash/dry face;Oral care;Cueing for sequencing Grooming Details (indicate cue type and reason): Consistent cues for sequencing Upper Body Bathing: Moderate assistance;Sitting   Lower Body Bathing: Maximal assistance   Upper Body Dressing : Moderate assistance;Sitting   Lower Body Dressing: Maximal assistance     Toilet Transfer Details (indicate cue type and reason): Mod +2 for sit to stand in [place]; total assist for transfer.  Functional mobility during ADLs: Moderate assistance;+2 for physical assistance (for sit to stand in [place])       Vision Baseline  Vision/History: Wears glasses Wears Glasses: Reading only Vision Assessment?: Vision impaired- to be further tested in functional context Additional Comments: Difficult to assess due to impaired cognition/communication. Pt has slight L sided gaze preference and R inattention but able to draw pt to R side with cues.     Perception     Praxis      Pertinent Vitals/Pain Pain Assessment: Faces Faces Pain Scale: Hurts little more Pain Location: LLE Pain Intervention(s): Monitored during session;Repositioned     Hand Dominance Right   Extremity/Trunk Assessment Upper Extremity Assessment Upper Extremity Assessment: RUE deficits/detail RUE Deficits / Details: Trace scapular elevation noted, otherwise no active movement noted. Full PROM. Does not withdraw to noxious stimuli. RUE Sensation: decreased light touch RUE Coordination: decreased fine motor;decreased gross motor   Lower Extremity Assessment Lower Extremity Assessment: Defer to PT evaluation       Communication Communication Communication: Receptive difficulties;Expressive difficulties   Cognition Arousal/Alertness: Awake/alert Behavior During Therapy: Flat affect Overall Cognitive Status: Difficult to assess Area of Impairment: Attention;Following commands;Problem solving;Safety/judgement                   Current Attention Level: Sustained   Following Commands: Follows one step commands inconsistently;Follows one step commands with increased time Safety/Judgement: Decreased awareness of deficits   Problem Solving: Slow processing;Decreased initiation;Requires verbal cues;Requires tactile cues General Comments: Pt inconsistently responding to questions and following commands. Pt able to verbalize her full name. Nodding yes/no to questions at start of session.   General Comments       Exercises     Shoulder Instructions      Home Living Family/patient expects to be discharged to:: Unsure Living  Arrangements: Children Available Help at Discharge: Family Type of Home: Apartment Home Access: Stairs to enter [employer] of Steps: 3 flights                       Additional Comments: Pt from [place], visiting daughter in [place]; daughter lives in 3rd floor apartment.       Prior Functioning/Environment Level of Independence: Independent        Comments: Per daughter, pt independent with ADL PTA.        OT Problem List: Decreased strength;Decreased range of motion;Decreased activity tolerance;Impaired balance (sitting and/or standing);Impaired vision/perception;Decreased coordination;Decreased cognition;Decreased safety awareness;Decreased knowledge of use of DME or AE;Decreased knowledge of precautions;Impaired sensation;Impaired tone;Impaired UE functional use;Pain;Increased edema      OT Treatment/Interventions: Self-care/ADL training;Therapeutic exercise;Neuromuscular education;Energy conservation;DME and/or AE instruction;Therapeutic activities;Cognitive remediation/compensation;Visual/perceptual remediation/compensation;Patient/family education;Balance training    OT Goals(Current goals can be found in the care plan section) Acute Rehab OT Goals Patient Stated Goal: none stated OT Goal Formulation: With patient/family Time For Goal Achievement: 06/07/16 Potential to Achieve Goals: Good  OT Frequency: Min 2X/week   Barriers to D/C:            Co-evaluation PT/OT/SLP Co-Evaluation/Treatment: Yes Reason for Co-Treatment: Complexity of the patient's impairments (multi-system involvement);For patient/therapist safety PT goals addressed during session: Mobility/safety with mobility OT goals addressed during session: ADL's and self-care      End of Session Equipment Utilized During Treatment: Other (comment) Antony Salmon) Nurse Communication: Mobility status;Need for lift equipment  Activity Tolerance: Patient tolerated treatment well Patient left: in  chair;with call bell/phone within reach;with family/visitor present  OT Visit Diagnosis: Muscle weakness (generalized) (  M62.81);Other abnormalities of gait and mobility (R26.89);Unsteadiness on feet (R26.81)                Time: 1610-9604 OT Time Calculation (min): 29 min Charges:  OT General Charges $OT Visit: 1 Procedure OT Evaluation $OT Eval Moderate Complexity: 1 Procedure G-Codes:     Arnetha Silverthorne A. Brett Albino, [name], OTR/L Pager: [phone]   Gaye Alken 05/24/2016, 11:32 AM

## 2016-05-24 NOTE — Consult Note (Signed)
Physical Medicine and Rehabilitation Consult   Reason for Consult: Right sided weakness, difficulty talking and difficulty swallowing Referring Physician: Dr. Jennette Kettle   HPI: Paige Stewart is a 66 y.o. RH-female with history of DM with neuropathy, HTN--medication noncompliance, who was found down by family on 05/21/16 with right sided weakness. EKG with ST changes and positive cardiac enzymes. 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  CTA chest/pelvis done due to concerns of aortic dissection and was negative for acute aortic syndrome with incidental finding of moderate stenosis proxima. R-RA.   2 D echo with EF 40-45% with severe hypokinesis of mid apical anteroseptal and anterior myocardium c/w with ischemia in distribution of LAD. Cardiology consulted for input and recommended outpatient work up for demand ischemia.  TEE negative for intracardiac embolic source. Dr. Pearlean Brownie recommended ASA for stroke due to unknown source. Therapy evaluations completed yesterday and patient limited by lethargy, right sided weakness, left gaze preference, expressive/receptive aphasia, dysphagia. CIR recommended for follow up therapy.    Patient (from Pa[place] Stewart been here since November with plans to go back home in 3 weeks. Daughter works but has been off due to surgery. ter    Review of Systems  Unable to perform ROS: Language  Neurological: Positive for sensory change (has significant neuropathy BLE PTA), speech change and focal weakness.      Past Medical History:  Diagnosis Date  . Diabetes mellitus without complication (HCC)   . Hypertension   . Neuropathy [facility])     Past Surgical History:  Procedure Laterality Date  . CESAREAN SECTION      Family History  Problem Relation Age of Onset  . Stroke Sister     two sisters with  recent strokes      Social History: Currently staying with daughter.   Retired Runner, broadcasting/film/video. Was reasonably active but liimited by neuropathy per daughter. She  reports that she has never smoked. She has never used smokeless tobacco. She reports that she does not drink alcohol or use drugs.    Allergies: No Known Allergies    Medications Prior to Admission  Medication Sig Dispense Refill  . gabapentin (NEURONTIN) 300 MG capsule Take 300 mg by mouth 2 (two) times daily.    Marland Kitchen ibuprofen (ADVIL,MOTRIN) 200 MG tablet Take 200 mg by mouth every 6 (six) hours as needed for moderate pain.    Marland Kitchen lisinopril (PRINIVIL,ZESTRIL) 10 MG tablet Take 1 tablet (10 mg total) by mouth daily. 30 tablet 0  . metFORMIN (GLUCOPHAGE) 1000 MG tablet Take 1,000 mg by mouth 2 (two) times daily with a meal.    . metoprolol tartrate (LOPRESSOR) 25 MG tablet Take 25 mg by mouth 2 (two) times daily.    . Multiple Vitamin (MULTIVITAMIN) tablet Take 1 tablet by mouth daily.    . methocarbamol (ROBAXIN) 500 MG tablet Take 1 tablet (500 mg total) by mouth 2 (two) times daily. (Patient not taking: Reported on 05/21/2016) 20 tablet 0    Home: Home Living Family/patient expects to be discharged to:: Unsure Living Arrangements: Children, Other relatives Available Help at Discharge: Family Type of Home: Apartment Home Access: Stairs to enter Entrance Stairs-Number of Steps: 45 (3rd floor) Additional Comments: Pt from [place], visiting daughter in [place]; daughter lives in 3rd floor apartment.   Functional History: Prior Function Level of Independence: Independent Comments: Sister-in-law present  and reports pt indep with all functional mobility and ADLs PTA.  Functional Status:  Mobility: Bed Mobility Overal bed mobility: Needs Assistance Bed Mobility: Supine to Sit, Sit to Supine Supine to sit: Max assist General bed mobility comments: Bed mobility with increased time and maxA +2 for trunk support and to maneuver BLE off bed; pt  able to use LUE HHA to come into sitting. Able to initiate sit-to-supine, requiring maxA +2 to maneuver BLE and scoot in bed.  Transfers Overall transfer level: Needs assistance Equipment used: 1 person hand held assist (LUE HHA on back of chair) Transfers: Sit to/from Stand Sit to Stand: Max assist, +2 physical assistance General transfer comment: Stood to with RUE support on back of chair and maxA for trunk elevation and hip extension; pt with significant R lateral lean and pushing from LLE.       ADL:    Cognition: Cognition Overall Cognitive Status: Difficult to assess Arousal/Alertness: Awake/alert Orientation Level: Other (comment) (UTA due to aphasia ) Attention: Focused Focused Attention: Appears intact Cognition Arousal/Alertness: Lethargic Behavior During Therapy: Flat affect Overall Cognitive Status: Difficult to assess Area of Impairment: Orientation, Following commands, Awareness, Safety/judgement, Problem solving, Attention Current Attention Level: Sustained Following Commands: Follows one step commands with increased time, Follows one step commands inconsistently Safety/Judgement: Decreased awareness of deficits, Decreased awareness of safety Awareness: Intellectual Problem Solving: Slow processing, Decreased initiation, Requires verbal cues, Requires tactile cues General Comments: Sister-in-law reports pt is fluent in [place], although this is not her native language. Pt able to nod yes/no to questions in English <25% of time; appeared to repond better when asked questions by sister-in-law in native language. Followed one step commands with increased time. Able to voice some answers with unintelligble words.  Difficult to assess due to: Impaired communication (Aphasia)  Blood pressure (!) 163/94, pulse 87, temperature 98.9 F (37.2 C), temperature source Oral, resp. rate 20, weight 66.5 kg (146 lb 8 oz), SpO2 98 %. Physical Exam  Nursing note and vitals  reviewed. Constitutional: She appears well-developed and well-nourished.  HENT:  Head: Normocephalic and atraumatic.  Eyes: Conjunctivae are normal. Pupils are equal, round, and reactive to light.  Neck: Normal range of motion. Neck supple.  Cardiovascular: Normal rate and regular rhythm.   Respiratory: Effort normal and breath sounds normal. No stridor. No respiratory distress. She has no wheezes.  GI: Soft. Bowel sounds are normal. She exhibits no distension.  Musculoskeletal: She exhibits no edema.  Neurological: She is alert.  Alert. Anxious appearing with minimal verbal output. Soft voice with tendency to mumble. Right sided weakness with sensory deficits. Able to attend to right with cues. Needs occasional cues to follow simple commands.   Skin: Skin is warm and dry.  Psychiatric: Her mood appears anxious. Her speech is delayed. She is slowed and withdrawn. She exhibits a depressed mood.  Motor: 0/5 in the right deltoid, bicep, triceps, grip, hip flexor, knee extensor, ankle dorsiflexor Motor 5/5 in the left deltoid, bicep, tricep, grip, hip flexor, knee extensor, ankle dorsal flexor. Sensation absent to pinch in the right upper limb, slight withdrawal in the right lower limb Deep tendon reflexes are absent in the right lower limb, 1 plus in the left lower limb  Results for orders placed or performed during the hospital encounter of 05/21/16 (from the past 24 hour(s))  Glucose, capillary     Status: Abnormal   Collection Time: 05/23/16 12:19 PM  Result Value Ref Range   Glucose-Capillary 169 (H) 65 -  99 mg/dL  Glucose, capillary     Status: Abnormal   Collection Time: 05/23/16  4:00 PM  Result Value Ref Range   Glucose-Capillary 197 (H) 65 - 99 mg/dL  Glucose, capillary     Status: Abnormal   Collection Time: 05/23/16  8:11 PM  Result Value Ref Range   Glucose-Capillary 177 (H) 65 - 99 mg/dL  Glucose, capillary     Status: Abnormal   Collection Time: 05/24/16  1:28 AM  Result  Value Ref Range   Glucose-Capillary 215 (H) 65 - 99 mg/dL  CBC     Status: None   Collection Time: 05/24/16  5:12 AM  Result Value Ref Range   WBC 4.1 4.0 - 10.5 K/uL   RBC 4.12 3.87 - 5.11 MIL/uL   Hemoglobin 12.3 12.0 - 15.0 g/dL   HCT 16.1 09.6 - 04.5 %   MCV 87.4 78.0 - 100.0 fL   MCH 29.9 26.0 - 34.0 pg   MCHC 34.2 30.0 - 36.0 g/dL   RDW 40.9 81.1 - 91.4 %   Platelets 243 150 - 400 K/uL  Basic metabolic panel     Status: Abnormal   Collection Time: 05/24/16  5:12 AM  Result Value Ref Range   Sodium 137 135 - 145 mmol/L   Potassium 4.0 3.5 - 5.1 mmol/L   Chloride 109 101 - 111 mmol/L   CO2 22 22 - 32 mmol/L   Glucose, Bld 220 (H) 65 - 99 mg/dL   BUN 9 6 - 20 mg/dL   Creatinine, Ser 7.82 0.44 - 1.00 mg/dL   Calcium 8.6 (L) 8.9 - 10.3 mg/dL   GFR calc non Af Amer >60 >60 mL/min   GFR calc Af Amer >60 >60 mL/min   Anion gap 6 5 - 15  Glucose, capillary     Status: Abnormal   Collection Time: 05/24/16  7:29 AM  Result Value Ref Range   Glucose-Capillary 192 (H) 65 - 99 mg/dL   Dg Swallowing Func-speech Pathology  Result Date: 05/22/2016 Objective Swallowing Evaluation: Type of Study: MBS-Modified Barium Swallow Study Patient Details Name: Paige Stewart MRN: [id number] Date of Birth: 1950/07/22 Today's Date: 05/22/2016 Time: SLP Start Time (ACUTE ONLY): 1350-SLP Stop Time (ACUTE ONLY): 1415 SLP Time Calculation (min) (ACUTE ONLY): 25 min Past Medical History: Past Medical History: Diagnosis Date . Diabetes mellitus without complication (HCC)  . Hypertension  . Neuropathy [facility])  Past Surgical History: Past Surgical History: Procedure Laterality Date . CESAREAN SECTION   HPI: Paige Stewart a 66 y.o.femalepresenting with right sided weakness, right facial droop, dysarthria. PMH is significant for DM2 ,HTN, diabetic neuropathy. CT head without hemorrhage, CT perfusion with small area of penumbra in the anterior MCA per neurology. MRI 05/21/16 showed acute left basal ganglia infarct,  moderate chronic small vessel ischemic disease. No prior swallowing evaluations in chart. Failed nursing stroke swallow screen. Subjective: Pt alert, not communicating verbally Assessment / Plan / Recommendation CHL IP CLINICAL IMPRESSIONS 05/22/2016 Clinical Impression Pt presents with a moderate oral, mild pharyngeal dysphagia marked by reduced sensorimotor function - this leads to poor bolus cohesion and propulsion, anterior loss right side, right oral residue.  Swallow onset is delayed, particularly with thin liquids, which reach the pyriforms and then are aspirated during the swallow (accompanied by cough).  A chin tuck effectively prevented aspiration, but pt required significant cues to help her to carry-out chin tuck. There was good pharyngeal clearance of all POs.  Esophageal sweep revealed barium retention in the distal  esophagus which remained throughout the study.  For now, recommend initiating a dysphagia 1 diet with nectar thick liquids.  Outside of meals, pt may have sips of thin liquid if she has 1:1 cueing and assist to tuck her chin.  Meds may be given whole in puree.  SLP will follow for safety/diet progression and pending speech language evaluation.  Pt's dtr present for exam and results/recs were reviewed.  SLP Visit Diagnosis Dysphagia, oropharyngeal phase (R13.12) Attention and concentration deficit following -- Frontal lobe and executive function deficit following -- Impact on safety and function Moderate aspiration risk   CHL IP TREATMENT RECOMMENDATION 05/22/2016 Treatment Recommendations Therapy as outlined in treatment plan below   Prognosis 05/22/2016 Prognosis for Safe Diet Advancement Good Barriers to Reach Goals -- Barriers/Prognosis Comment -- CHL IP DIET RECOMMENDATION 05/22/2016 SLP Diet Recommendations Dysphagia 1 (Puree) solids;Nectar thick liquid Liquid Administration via Cup Medication Administration Whole meds with puree Compensations Minimize environmental distractions;Slow  rate;Small sips/bites;Lingual sweep for clearance of pocketing Postural Changes Remain semi-upright after after feeds/meals (Comment)   CHL IP OTHER RECOMMENDATIONS 05/22/2016 Recommended Consults -- Oral Care Recommendations Oral care BID Other Recommendations Remove water pitcher;Have oral suction available   CHL IP FOLLOW UP RECOMMENDATIONS 05/22/2016 Follow up Recommendations (No Data)   CHL IP FREQUENCY AND DURATION 05/22/2016 Speech Therapy Frequency (ACUTE ONLY) min 3x week Treatment Duration 2 weeks      CHL IP ORAL PHASE 05/22/2016 Oral Phase Impaired Oral - Pudding Teaspoon -- Oral - Pudding Cup -- Oral - Honey Teaspoon -- Oral - Honey Cup -- Oral - Nectar Teaspoon -- Oral - Nectar Cup Right anterior bolus loss;Weak lingual manipulation;Incomplete tongue to palate contact;Reduced posterior propulsion;Delayed oral transit Oral - Nectar Straw -- Oral - Thin Teaspoon -- Oral - Thin Cup Right anterior bolus loss;Weak lingual manipulation;Incomplete tongue to palate contact;Reduced posterior propulsion;Delayed oral transit Oral - Thin Straw -- Oral - Puree Weak lingual manipulation;Incomplete tongue to palate contact;Reduced posterior propulsion;Right anterior bolus loss;Delayed oral transit Oral - Mech Soft Right anterior bolus loss;Weak lingual manipulation;Incomplete tongue to palate contact;Reduced posterior propulsion;Delayed oral transit Oral - Regular -- Oral - Multi-Consistency -- Oral - Pill -- Oral Phase - Comment --  CHL IP PHARYNGEAL PHASE 05/22/2016 Pharyngeal Phase Impaired Pharyngeal- Pudding Teaspoon -- Pharyngeal -- Pharyngeal- Pudding Cup -- Pharyngeal -- Pharyngeal- Honey Teaspoon -- Pharyngeal -- Pharyngeal- Honey Cup -- Pharyngeal -- Pharyngeal- Nectar Teaspoon -- Pharyngeal -- Pharyngeal- Nectar Cup Delayed swallow initiation-vallecula Pharyngeal -- Pharyngeal- Nectar Straw -- Pharyngeal -- Pharyngeal- Thin Teaspoon -- Pharyngeal -- Pharyngeal- Thin Cup Delayed swallow initiation-pyriform  sinuses;Reduced airway/laryngeal closure;Penetration/Aspiration during swallow;Trace aspiration Pharyngeal Material enters airway, passes BELOW cords and not ejected out despite cough attempt by patient Pharyngeal- Thin Straw -- Pharyngeal -- Pharyngeal- Puree Delayed swallow initiation-vallecula Pharyngeal -- Pharyngeal- Mechanical Soft Delayed swallow initiation-vallecula Pharyngeal -- Pharyngeal- Regular -- Pharyngeal -- Pharyngeal- Multi-consistency -- Pharyngeal -- Pharyngeal- Pill -- Pharyngeal -- Pharyngeal Comment --  CHL IP CERVICAL ESOPHAGEAL PHASE 05/22/2016 Cervical Esophageal Phase (No Data) Pudding Teaspoon -- Pudding Cup -- Honey Teaspoon -- Honey Cup -- Nectar Teaspoon -- Nectar Cup -- Nectar Straw -- Thin Teaspoon -- Thin Cup -- Thin Straw -- Puree -- Mechanical Soft -- Regular -- Multi-consistency -- Pill -- Cervical Esophageal Comment -- No flowsheet data found. 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 Does the  need for close, 24 hr/day medical supervision in concert with the patient's rehab needs make it unreasonable for this patient to be served in a less intensive setting? Yes 2. Co-Morbidities requiring supervision/potential complications: Diabetes, hypertension, cardiomyopathy 3. Due to bladder management, bowel management, safety, skin/wound care, disease management, medication administration, pain management and patient education, does the patient require 24 hr/day rehab nursing? Yes 4. Does the patient require coordinated care of a physician, rehab nurse, PT (1-2 hrs/day, 5 days/week), OT (1-2 hrs/day, 5 days/week) and SLP (.5-1 hrs/day, 5 days/week) to address physical and functional deficits in the context of the above medical diagnosis(es)? Yes Addressing deficits in the following areas: balance, endurance, locomotion, strength, transferring, bowel/bladder control,  bathing, dressing, feeding, grooming, toileting, cognition, speech, language, swallowing and psychosocial support 5. Can the patient actively participate in an intensive therapy program of at least 3 hrs of therapy per day at least 5 days per week? Yes 6. The potential for patient to make measurable gains while on inpatient rehab is good 7. Anticipated functional outcomes upon discharge from inpatient rehab are min assist  with PT, min assist with OT, modified independent with SLP. 8. Estimated rehab length of stay to reach the above functional goals is: 19-22d 9. Does the patient have adequate social supports and living environment to accommodate these discharge functional goals? Yes 10. Anticipated D/C setting: Home 11. Anticipated post D/C treatments: HH therapy 12. Overall Rehab/Functional Prognosis: good  RECOMMENDATIONS: This patient's condition is appropriate for continued rehabilitative care in the following setting: CIR Patient has agreed to participate in recommended program. Yes Note that insurance prior authorization may be required for reimbursement for recommended care.  Comment: Will not be ready to fly back to [place] in 3 weeks  Erick Colace M.D. [facility] FAAPM&R (Sports Med, Neuromuscular Med) Diplomate [place] Board of Electrodiagnostic Med  Jerene Pitch 05/24/2016

## 2016-05-24 NOTE — Progress Notes (Signed)
Transitions of Care Pharmacy Note  Plan:  Educated on medication indications Addressed concerns regarding adequate supply of medications for return trip Follow-up with Family medicine and neurology regarding the availability of plavix and lipitor in [place] --------------------------------------------- Paige Stewart is an 66 y.o. female who presents with a chief complaint altered mental status. In anticipation of discharge, pharmacy has reviewed this patient's prior to admission medication history, as well as current inpatient medications listed per the [facility].  Current medication indications, dosing, frequency, and notable side effects reviewed with patient and family. patient and family verbalized understanding of current inpatient medication regimen and are aware that the After Visit Summary when presented, will represent the most accurate medication list at discharge.   Damisha Cobb's daughter expressed concerns regarding availability of medications in [place].    Assessment: (Daughter - Caregiver) Understanding of regimen: fair Understanding of indications: good Potential of compliance: good Barriers to Obtaining Medications: Potentially. The daughter will contact family in [place] to determine the availability of some of these medications.  Patient instructed to contact inpatient pharmacy team with further questions or concerns if needed.    Time spent preparing for discharge counseling: 10 minutes Time spent counseling patient: 20 minutes   Thank you for allowing pharmacy to be a part of this patient's care.  Alfredo Bach, Cleotis Nipper, PharmD Clinical Pharmacy Resident [phone] (Pager) 05/24/2016 5:45 PM

## 2016-05-24 NOTE — Progress Notes (Signed)
Family Medicine Teaching Service Daily Progress Note Intern Pager: [phone]  Patient name: Paige Stewart Medical record number: [id number] Date of birth: October 30, 1950 Age: 66 y.o. Gender: female  Primary Care Provider: Pcp Not In System Consultants: Cardiology, Neurology  Code Status: Partial. Patient does not want to be intubated.   Chief Complaint: right sided weakness, right facial droop, and dysarthria.   Assessment and Plan: Paige Stewart is a 66 y.o. female presenting with right sided weakness, right facial droop, dysarthria. PMH is significant for DM2, HTN, diabetic neuropathy.   Acute basal ganglia infarct - with Right Sided Weakness, facial droop and dysarthria: CT perfusion with small area of penumbra in the anterior MCA per neurology. MR brain with acute basal ganglia infarct.  - neurology consulted and following, appreciate recommendations  - ASA  PR  - Atorvastatin 40 mg Qd - frequent neuro checks - PT/OT/Speech >> transition to honey-thickened liquids, recommending rehab, will place CIR referral - Echo, TEE completed (results below)  Elevated Troponin, improving: trend  istat 1.91> >>>4.13>3.94 and EKG changes thought to be secondary to stroke. No chest pain or shortness of breath. Echo completed with anterior wall motion abnl, unsure if acute or chronic - cardiology consulted, appreciate recommendations - consider outpatient NUC stress in a few months if pt still here - likely no loop recorder; patient going back to [place] - added plavix 75 mg Qd - to continue for one year s/p this acute event  T2DM, poorly controlled HbA1c 10.3 On metformin at home. Overnight CBG 215,220 requiring 5u lantus. - holding metformin - SSI insulin - started NPH ie 5 mg BID  - diabetes teaching - restarted home gabapentin  Hypokalemia, improved after repletion, 4.0 today - AM BMP  UTI vs pyelo- Dysuria, UA suggestive of infection, now with left CVA tenderness and fevers  yesterday consistent with pyelo - keflex 500 mg BID  (4/3 - ) >>transition to ciprofloxacin 500 mg BID for 7 days to treat pyelo - Urine Cx show 100k colonies E coli, and sensitivities to follow - monitor fever curve - holding tylenol for now   HTN: still hypertensive, BP 150-161/90 overnight  Home medications: lisinopril 10 mg Qd and metoprolol 25 mg BID.  - vitals per protocol - continue home lisinopril 10 mg QD - started HCTZ 12.5 mg QD >>titrate up to 25 mg Qd  - transition to Toprol 24h: 25 mg Qd  Small to Moderate Pericardial Effusion: noted on CT mildly increased since 04/2016.  - ECHO - small,  fluid had no internal echoes.There was no evidence of hemodynamic compromise.  Vascular Disease: noted on CT with three vessel coronary atherosclerosis and moderate stenosis of the proximal right renal artery and IMA.   FEN/GI: dysphagia 1 with honey thickened liquids Prophylaxis: SCDs    Disposition: ?rehab  Subjective:  No acute events overnight.  Febrile yesterday afternoon at 101.43F, unsure if this is related to UTI but no other fevers. C/o back pain with CVA tenderness noted this AM, no skin breakdown or ulcerations in the back. She does also note her chronic neuropathic pain in her left leg. Otherwise patient is sitting comfortably in bed, more alert, more interactive today.   Objective: Temp:  [98.7 F (37.1 C)-101.7 F (38.7 C)] 98.9 F (37.2 C) (04/03 2012) Pulse Rate:  [78-107] 87 (04/04 0810) Resp:  [11-21] 20 (04/04 0810) BP: (150-197)/(80-106) 163/94 (04/04 0810) SpO2:  [97 %-100 %] 98 % (04/04 0810) Physical Exam: General: NAD, comfortable Cardiovascular: RRR, no m/r/g Respiratory:  CTA bil, no W/R/R Abdomen: soft and nontender, non-distended GU: +L CVA tenderness Extremities: no LE edema, warm and well-perfused, no skin breakdown or lesions SKIN: no skin breakdown noted Neuro: +Dysarthria, Cn 2-4 intact. Decreased movement of right side of face. CN 6 intact.  CN 8-12 normal except right shoulder shrug decreased.  R arm and leg weakness (1/5), R arm and R leg strength normal 4/5 bilaterally   Laboratory:  Important labs this admission CK WNL  Lipid Panel     Component Value Date/Time   CHOL 168 05/22/2016 0006   TRIG 81 05/22/2016 0006   HDL 34 (L) 05/22/2016 0006   CHOLHDL 4.9 05/22/2016 0006   VLDL 16 05/22/2016 0006   LDLCALC 118 (H) 05/22/2016 0006      Recent Labs Lab 05/22/16 0006 05/23/16 0511 05/24/16 0512  WBC 4.7 6.3 4.1  HGB 12.9 13.1 12.3  HCT 36.6 37.1 36.0  PLT 232 258 243    Recent Labs Lab 05/21/16 0953  05/22/16 0006 05/23/16 0511 05/24/16 0512  NA 137  < > 139 137 137  K 3.8  < > 3.5 3.0* 4.0  CL 101  < > 105 105 109  CO2 27  --  BUN 14  < > CREATININE 0.76  < > 0.71 0.64 0.69  CALCIUM 9.3  --  9.0 8.7* 8.6*  PROT 7.2  --   --   --   --   BILITOT 0.4  --   --   --   --   ALKPHOS 51  --   --   --   --   ALT 17  --   --   --   --   AST 21  --   --   --   --   GLUCOSE 254*  < > 183* 155* 220*  < > = values in this interval not displayed.    Imaging/Diagnostic Tests: EKG with T wave inversion in 1, aVL and V2 with mild ST elevation in V2.  Echo 4/3 - Left ventricle: The cavity size was normal. There was mild focal   basal hypertrophy of the septum. Systolic function was mildly to   moderately reduced. The estimated ejection fraction was in the   range of 40% to 45%. Severe hypokinesis of the   mid-apicalanteroseptal and anterior myocardium; consistent with   ischemia in the distribution of the left anterior descending   coronary artery. Akinesis of the apical myocardium. There was   fusion of early and atrial contributions to ventricular filling.   The study is not technically sufficient to allow evaluation of LV   diastolic function. No evidence of thrombus. - Atrial septum: Echo contrast study showed no right-to-left atrial   level shunt, at baseline or with  provocation. - Pericardium, extracardiac: A small pericardial effusion was   identified anterior to the heart. The fluid had no internal   echoes.There was no evidence of hemodynamic compromise.  TEE 4/3 - Left ventricle: There is akinesis of the mid anteroseptal,   anterior and apical septal and anterior walls. Wall thickness was   increased in a pattern of moderate LVH. Systolic function was   mildly reduced. The estimated ejection fraction was in the range   of 45% to 50%. - Aorta: There was moderate non-mobile atheroma. - Descending aorta: The descending aorta was normal in size. - Mitral valve: There was no significant regurgitation. - Left atrium: No evidence  of thrombus in the atrial cavity or   appendage. No evidence of thrombus in the atrial cavity or   appendage. - Right ventricle: Systolic function was normal. - Right atrium: Chiari network seen in the right atrium. No   evidence of thrombus in the atrial cavity or appendage. - Atrial septum: No defect or patent foramen ovale was identified. - Tricuspid valve: There was no significant regurgitation. - Pericardium, extracardiac: A mild pericardial effusion was   identified. No tamponade.  Impressions: - No cardiac source of emboli was indentified.   CT ANGIOGRAPHY HEAD AND NECK CT PERFUSION BRAIN TECHNIQUE - 05/21/2016:  FINDINGS: CTA NECK FINDINGS Aortic arch: Minimal atherosclerotic disease in the aortic arch. Proximal great vessels tortuous but widely patent. Right carotid system: Right common carotid artery widely patent. Right internal carotid artery widely patent. Severe stenosis at the origin of the right external carotid artery Left carotid system: Left common carotid artery widely patent with mild atherosclerotic disease. Mild atherosclerotic calcification of the left carotid bifurcation without significant stenosis. Vertebral arteries: Both vertebral arteries patent without significant stenosis. Skeleton: Scoliosis. No  acute skeletal abnormality. Minimal degenerative change in the cervical spine. Other neck: Multiple small thyroid nodules. No mass or adenopathy in the neck. Upper chest: Lung apices clear. Review of the MIP images confirms the above findings   CTA HEAD FINDINGS  Anterior circulation: Mild atherosclerotic disease in the right cavernous carotid artery without significant stenosis. Right anterior cerebral artery widely patent. Right M1 widely patent. Right middle cerebral artery branches widely patent without stenosis. Mild atherosclerotic calcification left cavernous carotid without stenosis. Left anterior cerebral artery widely patent. Left M1 segment is widely patent. Anterior branch of the left middle cerebral artery shows decreased density suggesting hypoperfusion. There appears to be a stenosis at the origin of this M3 branch of the left middle cerebral artery supplying the left frontotemporal lobe. Posterior circulation: Both vertebral arteries patent to the basilar. Basilar patent. PICA, superior cerebellar, posterior cerebral arteries patent without significant stenosis or occlusion. Venous sinuses: Patent Anatomic variants: None Delayed phase: Not performed Review of the MIP images confirms the above findings CT Brain Perfusion Findings: CBF (<30%) Volume: 0mL Perfusion (Tmax>6.0s) volume: 23mL Mismatch Volume: 23mL Infarction Location:Left frontotemporal lobe and left frontal operculum  IMPRESSION: 23 mL volume of hypoperfusion in the left frontotemporal lobe and left frontal operculum without fixed infarct by CT perfusion Hypoperfusion of the anterior branch of left MCA supplying the above area of infarction. This appears to be a left M3 branch which may have a severe stenosis. Left M1 is widely patent. No significant vertebral or carotid artery stenosis in the neck.  Ct Angio Neck W And/or Wo Contrast  05/21/2016 CTA NECK FINDINGS Aortic arch: Minimal atherosclerotic disease in the aortic arch.  Proximal great vessels tortuous but widely patent. Right carotid system: Right common carotid artery widely patent. Right internal carotid artery widely patent. Severe stenosis at the origin of the right external carotid artery Left carotid system: Left common carotid artery widely patent with mild atherosclerotic disease. Mild atherosclerotic calcification of the left carotid bifurcation without significant stenosis. Vertebral arteries: Both vertebral arteries patent without significant stenosis. Skeleton: Scoliosis. No acute skeletal abnormality. Minimal degenerative change in the cervical spine. Other neck: Multiple small thyroid nodules. No mass or adenopathy in the neck. Upper chest: Lung apices clear. Review of the MIP images confirms the above findings   CTA HEAD FINDINGS Anterior circulation: Mild atherosclerotic disease in the right cavernous carotid artery without significant  stenosis. Right anterior cerebral artery widely patent. Right M1 widely patent. Right middle cerebral artery branches widely patent without stenosis. Mild atherosclerotic calcification left cavernous carotid without stenosis. Left anterior cerebral artery widely patent. Left M1 segment is widely patent. Anterior branch of the left middle cerebral artery shows decreased density suggesting hypoperfusion. There appears to be a stenosis at the origin of this M3 branch of the left middle cerebral artery supplying the left frontotemporal lobe. Posterior circulation: Both vertebral arteries patent to the basilar. Basilar patent. PICA, superior cerebellar, posterior cerebral arteries patent without significant stenosis or occlusion. Venous sinuses: Patent Anatomic variants: None Delayed phase: Not performed Review of the MIP images confirms the above findings   CT Brain Perfusion Findings: CBF (<30%) Volume: 0mL Perfusion (Tmax>6.0s) volume: 23mL Mismatch Volume: 23mL Infarction Location:Left frontotemporal lobe and left frontal operculum    IMPRESSION: 23 mL volume of hypoperfusion in the left frontotemporal lobe and left frontal operculum without fixed infarct by CT perfusion Hypoperfusion of the anterior branch of left MCA supplying the above area of infarction. This appears to be a left M3 branch which may have a severe stenosis. Left M1 is widely patent. No significant vertebral or carotid artery stenosis in the neck.   Mr Brain Wo Contrast 05/21/2016 FINDINGS: Brain: There is an acute left lateral lenticulostriate territory infarct extending from the body of the left caudate nucleus into the posterior left lentiform nucleus and external capsule region. There is no evidence of associated hemorrhage. No mass, midline shift, or extra-axial fluid collection is seen. There is mild cerebral atrophy. Patchy T2 hyperintensities throughout the subcortical and deep cerebral white matter bilaterally are nonspecific but compatible with moderate chronic small vessel ischemic disease. There is a chronic lacunar infarct in the left caudate. Vascular: Major intracranial vascular flow voids are preserved. Skull and upper cervical spine: Unremarkable bone marrow signal. Sinuses/Orbits: Unremarkable orbits.  No significant sinus disease. Other: None.  IMPRESSION:  1. Acute left basal ganglia infarct.  2. Moderate chronic small vessel ischemic disease. Ct Cerebral Perfusion W Contrast  CT ANGIOGRAPHY HEAD AND NECK CT PERFUSION BRAIN TECHNIQUE FINDINGS: CTA NECK FINDINGS 05/21/2016 Aortic arch: Minimal atherosclerotic disease in the aortic arch. Proximal great vessels tortuous but widely patent. Right carotid system: Right common carotid artery widely patent. Right internal carotid artery widely patent. Severe stenosis at the origin of the right external carotid artery Left carotid system: Left common carotid artery widely patent with mild atherosclerotic disease. Mild atherosclerotic calcification of the left carotid bifurcation without significant stenosis.  Vertebral arteries: Both vertebral arteries patent without significant stenosis. Skeleton: Scoliosis. No acute skeletal abnormality. Minimal degenerative change in the cervical spine. Other neck: Multiple small thyroid nodules. No mass or adenopathy in the neck. Upper chest: Lung apices clear. Review of the MIP images confirms the above findings   CTA HEAD FINDINGS Anterior circulation: Mild atherosclerotic disease in the right cavernous carotid artery without significant stenosis. Right anterior cerebral artery widely patent. Right M1 widely patent. Right middle cerebral artery branches widely patent without stenosis. Mild atherosclerotic calcification left cavernous carotid without stenosis. Left anterior cerebral artery widely patent. Left M1 segment is widely patent. Anterior branch of the left middle cerebral artery shows decreased density suggesting hypoperfusion. There appears to be a stenosis at the origin of this M3 branch of the left middle cerebral artery supplying the left frontotemporal lobe. Posterior circulation: Both vertebral arteries patent to the basilar. Basilar patent. PICA, superior cerebellar, posterior cerebral arteries patent without significant stenosis or  occlusion. Venous sinuses: Patent Anatomic variants: None Delayed phase: Not performed Review of the MIP images confirms the above findings CT Brain Perfusion Findings: CBF (<30%) Volume: 0mL Perfusion (Tmax>6.0s) volume: 23mL Mismatch Volume: 23mL Infarction Location:Left frontotemporal lobe and left frontal operculum  IMPRESSION: 23 mL volume of hypoperfusion in the left frontotemporal lobe and left frontal operculum without fixed infarct by CT perfusion Hypoperfusion of the anterior branch of left MCA supplying the above area of infarction. This appears to be a left M3 branch which may have a severe stenosis. Left M1 is widely patent. No significant vertebral or carotid artery stenosis in the neck.      Howard Pouch,  MD 05/24/2016, 9:38 AM PGY-1, [facility] Family Medicine FPTS Intern pager: [phone], text pages welcome

## 2016-05-24 NOTE — Progress Notes (Signed)
STROKE TEAM PROGRESS NOTE   SUBJECTIVE (INTERVAL HISTORY) Her daughter, who is a traveling nurse, is at the bedside, along with her husband and another family member/friend. States pt is speaking more to them today than yesterday.    OBJECTIVE Temp:  [98.7 F (37.1 C)-101.7 F (38.7 C)] 98.9 F (37.2 C) (04/03 2012) Pulse Rate:  [78-107] 87 (04/04 0810) Cardiac Rhythm: Normal sinus rhythm (04/04 0700) Resp:  [11-21] 20 (04/04 0810) BP: (150-197)/(80-106) 163/94 (04/04 0810) SpO2:  [97 %-100 %] 98 % (04/04 0810)  CBC:  Recent Labs Lab 05/21/16 0953  05/23/16 0511 05/24/16 0512  WBC 5.1  < > 6.3 4.1  NEUTROABS 3.6  --   --   --   HGB 12.6  < > 13.1 12.3  HCT 37.3  < > 37.1 36.0  MCV 86.9  < > 86.3 87.4  PLT 233  < > 258 243  < > = values in this interval not displayed.  Basic Metabolic Panel:   Recent Labs Lab 05/23/16 0511 05/24/16 0512  NA 137 137  K 3.0* 4.0  CL 105 109  CO2 23 22  GLUCOSE 155* 220*  BUN 8 9  CREATININE 0.64 0.69  CALCIUM 8.7* 8.6*     PHYSICAL EXAM Pleasant middle age african lady not in distress. . Afebrile. Head is nontraumatic. Neck is supple without bruit.    Cardiac exam no murmur or gallop. Lungs are clear to auscultation. Distal pulses are well felt. Neurological Exam :  Awake alert. severe expressive aphasia and will speak only a few words. Able to follow some midline and few simple commands but needs the questions to be repeated 2-3 times. Blinks to threat on the left but not the right. Right lower facial weakness. Tongue midline. Dense right hemiplegia with only trace withdrawal to painful stimuli in the legs. Tone is diminished on the right compared to the left. Reflexes are depressed on the right and normal on the left. Right plantar upgoing left downgoing. Gait was not tested.   ASSESSMENT/PLAN Paige Stewart is a 66 y.o. female with history of HTN, diabetes and neuropathy presenting with R sided weakness. She did not receive  IV t-PA due to delay in arrival.   Stroke:  left basal ganglia infarct embolic secondary to unknown source  Resultant  R hemiparesis, R facial, likely some aphasia (decreased ability to follow commands in non-primary English speaker)  Code Stroke CT head no acute infarcts. Aspects 10. Chronic microvascular disease  CTA head L MCA hypoperfusion corresponding to infarct w/ severe L M3 stenosis.   CTA neck Unremarkable   CT P penumbra 3 mL left frontal love without fixed infarct   MRI  left basal ganglia infarct. Moderate small vessel ischemic disease.  2D Echo bubble  40-45%. no SOE. smsall pericardial effusion. LA 34  TEE no thrombus, no embolic source. moderate non-mobile atheroma  Will not consider further embolic workup with a loop for atrial fibrillation monitoring given planned return to [place]  LDL 118  HgbA1c 10.3  SCDs for VTE prophylaxis DIET - DYS 1 Room service appropriate? Yes; Fluid consistency: Thin  No antithrombotic prior to admission, now on aspirin 300 mg suppository daily. Ok for change to plavix.  Therapy recommendations:  CIR. Consult request in place  Disposition:  pending  (lives alone in [place], visiting family in [place], has emergency health insurance)  Hypertensive Urgency  BP as high as 224/124 on arrival in setting of neurologic symptoms  BP lowering, 150-160s  Permissive hypertension (OK if < 220/120) but gradually normalize in 5-7 days  Long-term BP goal normotensive  Hyperlipidemia  Home meds:  No statin  LDL 118 goal  Added statin  - lipitor 40 mg daily  Continue statin at discharge  Diabetes  HgbA1c goal < 7.0  Other Stroke Risk Factors  Advanced age  UDS not performed  Overweight, Body mass index is 29.59 kg/m.  Other Active Problems  Elevated troponin/demand ischemia with T-wave inversion. Cardiology consulted. No workup at this time. Consider OP workup.  CTA/C/P No aortic syndrome. Small to moderate pericardial  effusion, mildly increased since 05/04/2016. Trace dependent bilateral pleural effusions. Three-vessel coronary atherosclerosis. Moderate RAS.  Small hiatal hernia.   NOTHING FURTHER TO ADD FROM THE STROKE STANDPOINT  Patient has a 10-15% risk of having another stroke over the next year, the highest risk is within 2 weeks of the most recent stroke/TIA (risk of having a stroke following a stroke or TIA is the same).  Ongoing risk factor control by Primary Care Physician  Stroke Service will sign off. Please call should any needs arise.  Follow-up Stroke Clinic at [facility] with Dr. Delia Heady in 2 months, if still in the [place], order placed.  Hospital day # 3  [name] [facility] See Amion for Pager information 05/24/2016 9:39 AM   I have personally examined this patient, reviewed notes, independently viewed imaging studies, participated in medical decision making and plan of care.ROS completed by me personally and pertinent positives fully documented  I have made any additions or clarifications directly to the above note. Agree with note above.   Delia Heady, MD Medical Director [facility] Pager: [phone] 05/24/2016 1:04 PM   To contact Stroke Continuity provider, please refer to [email or web address]. After hours, contact General Neurology

## 2016-05-25 ENCOUNTER — Inpatient Hospital Stay (HOSPITAL_COMMUNITY)
Admission: RE | Admit: 2016-05-25 | Discharge: 2016-06-16 | DRG: 056 | Disposition: A | Discharge status: Home / Self Care | Payer: No Typology Code available for payment source | Source: Intra-hospital | Attending: Physical Medicine & Rehabilitation | Admitting: Physical Medicine & Rehabilitation

## 2016-05-25 ENCOUNTER — Encounter (HOSPITAL_COMMUNITY): Payer: Self-pay | Admitting: Cardiology

## 2016-05-25 DIAGNOSIS — E1165 Type 2 diabetes mellitus with hyperglycemia: Secondary | ICD-10-CM | POA: Diagnosis present

## 2016-05-25 DIAGNOSIS — R1310 Dysphagia, unspecified: Secondary | ICD-10-CM | POA: Diagnosis present

## 2016-05-25 DIAGNOSIS — E876 Hypokalemia: Secondary | ICD-10-CM | POA: Diagnosis present

## 2016-05-25 DIAGNOSIS — E860 Dehydration: Secondary | ICD-10-CM | POA: Diagnosis present

## 2016-05-25 DIAGNOSIS — I639 Cerebral infarction, unspecified: Secondary | ICD-10-CM

## 2016-05-25 DIAGNOSIS — Z9114 Patient's other noncompliance with medication regimen: Secondary | ICD-10-CM

## 2016-05-25 DIAGNOSIS — B9620 Unspecified Escherichia coli [E. coli] as the cause of diseases classified elsewhere: Secondary | ICD-10-CM | POA: Diagnosis present

## 2016-05-25 DIAGNOSIS — I6381 Other cerebral infarction due to occlusion or stenosis of small artery: Secondary | ICD-10-CM | POA: Diagnosis present

## 2016-05-25 DIAGNOSIS — I252 Old myocardial infarction: Secondary | ICD-10-CM

## 2016-05-25 DIAGNOSIS — I214 Non-ST elevation (NSTEMI) myocardial infarction: Secondary | ICD-10-CM

## 2016-05-25 DIAGNOSIS — G8191 Hemiplegia, unspecified affecting right dominant side: Secondary | ICD-10-CM | POA: Diagnosis not present

## 2016-05-25 DIAGNOSIS — I10 Essential (primary) hypertension: Secondary | ICD-10-CM | POA: Diagnosis present

## 2016-05-25 DIAGNOSIS — F329 Major depressive disorder, single episode, unspecified: Secondary | ICD-10-CM | POA: Diagnosis present

## 2016-05-25 DIAGNOSIS — Z79899 Other long term (current) drug therapy: Secondary | ICD-10-CM

## 2016-05-25 DIAGNOSIS — I69351 Hemiplegia and hemiparesis following cerebral infarction affecting right dominant side: Principal | ICD-10-CM

## 2016-05-25 DIAGNOSIS — N390 Urinary tract infection, site not specified: Secondary | ICD-10-CM | POA: Diagnosis present

## 2016-05-25 DIAGNOSIS — G4700 Insomnia, unspecified: Secondary | ICD-10-CM | POA: Diagnosis present

## 2016-05-25 DIAGNOSIS — R4701 Aphasia: Secondary | ICD-10-CM | POA: Diagnosis present

## 2016-05-25 DIAGNOSIS — Z794 Long term (current) use of insulin: Secondary | ICD-10-CM

## 2016-05-25 DIAGNOSIS — Z823 Family history of stroke: Secondary | ICD-10-CM | POA: Diagnosis not present

## 2016-05-25 DIAGNOSIS — Z1612 Extended spectrum beta lactamase (ESBL) resistance: Secondary | ICD-10-CM | POA: Diagnosis present

## 2016-05-25 DIAGNOSIS — I701 Atherosclerosis of renal artery: Secondary | ICD-10-CM | POA: Diagnosis present

## 2016-05-25 DIAGNOSIS — I951 Orthostatic hypotension: Secondary | ICD-10-CM | POA: Diagnosis present

## 2016-05-25 DIAGNOSIS — E1142 Type 2 diabetes mellitus with diabetic polyneuropathy: Secondary | ICD-10-CM

## 2016-05-25 DIAGNOSIS — R609 Edema, unspecified: Secondary | ICD-10-CM | POA: Diagnosis not present

## 2016-05-25 DIAGNOSIS — R7989 Other specified abnormal findings of blood chemistry: Secondary | ICD-10-CM | POA: Diagnosis not present

## 2016-05-25 DIAGNOSIS — R112 Nausea with vomiting, unspecified: Secondary | ICD-10-CM

## 2016-05-25 LAB — URINE CULTURE

## 2016-05-25 LAB — BASIC METABOLIC PANEL
ANION GAP: 8 (ref 5–15)
BUN: 7 mg/dL (ref 6–20)
CALCIUM: 8.9 mg/dL (ref 8.9–10.3)
CO2: 24 mmol/L (ref 22–32)
CREATININE: 0.64 mg/dL (ref 0.44–1.00)
Chloride: 104 mmol/L (ref 101–111)
Glucose, Bld: 242 mg/dL — ABNORMAL HIGH (ref 65–99)
Potassium: 3.4 mmol/L — ABNORMAL LOW (ref 3.5–5.1)
SODIUM: 136 mmol/L (ref 135–145)

## 2016-05-25 LAB — CBC
HEMATOCRIT: 36.5 % (ref 36.0–46.0)
Hemoglobin: 12.5 g/dL (ref 12.0–15.0)
MCH: 29.6 pg (ref 26.0–34.0)
MCHC: 34.2 g/dL (ref 30.0–36.0)
MCV: 86.3 fL (ref 78.0–100.0)
Platelets: 254 10*3/uL (ref 150–400)
RBC: 4.23 MIL/uL (ref 3.87–5.11)
RDW: 13.2 % (ref 11.5–15.5)
WBC: 8.8 10*3/uL (ref 4.0–10.5)

## 2016-05-25 LAB — GLUCOSE, CAPILLARY
GLUCOSE-CAPILLARY: 218 mg/dL — AB (ref 65–99)
GLUCOSE-CAPILLARY: 223 mg/dL — AB (ref 65–99)
GLUCOSE-CAPILLARY: 231 mg/dL — AB (ref 65–99)
GLUCOSE-CAPILLARY: 242 mg/dL — AB (ref 65–99)
GLUCOSE-CAPILLARY: 293 mg/dL — AB (ref 65–99)

## 2016-05-25 MED ORDER — SULFAMETHOXAZOLE-TRIMETHOPRIM 800-160 MG PO TABS
1.0000 | ORAL_TABLET | Freq: Two times a day (BID) | ORAL | Status: DC
  Administered 2016-05-25: 1 via ORAL
  Filled 2016-05-25 (×2): qty 1

## 2016-05-25 MED ORDER — LISINOPRIL 20 MG PO TABS
20.0000 mg | ORAL_TABLET | Freq: Every day | ORAL | Status: DC
  Administered 2016-05-25: 20 mg via ORAL
  Filled 2016-05-25: qty 1

## 2016-05-25 MED ORDER — PROCHLORPERAZINE MALEATE 5 MG PO TABS
5.0000 mg | ORAL_TABLET | Freq: Four times a day (QID) | ORAL | Status: DC | PRN
  Administered 2016-06-01: 5 mg via ORAL
  Filled 2016-05-25: qty 1

## 2016-05-25 MED ORDER — BISACODYL 10 MG RE SUPP
10.0000 mg | Freq: Every day | RECTAL | Status: DC | PRN
  Administered 2016-06-03: 10 mg via RECTAL
  Filled 2016-05-25 (×2): qty 1

## 2016-05-25 MED ORDER — SIMETHICONE 80 MG PO CHEW: 80.0000 mg | CHEWABLE_TABLET | Freq: Four times a day (QID) | ORAL | Status: DC | PRN

## 2016-05-25 MED ORDER — PROCHLORPERAZINE EDISYLATE 5 MG/ML IJ SOLN
5.0000 mg | Freq: Four times a day (QID) | INTRAMUSCULAR | Status: DC | PRN
  Administered 2016-05-29: 5 mg via INTRAMUSCULAR
  Administered 2016-05-30 (×3): 10 mg via INTRAMUSCULAR
  Filled 2016-05-25 (×4): qty 2

## 2016-05-25 MED ORDER — GUAIFENESIN-DM 100-10 MG/5ML PO SYRP: 5.0000 mL | ORAL_SOLUTION | Freq: Four times a day (QID) | ORAL | Status: DC | PRN

## 2016-05-25 MED ORDER — ENOXAPARIN SODIUM 40 MG/0.4ML ~~LOC~~ SOLN
40.0000 mg | SUBCUTANEOUS | Status: DC
  Administered 2016-05-25 – 2016-05-29 (×5): 40 mg via SUBCUTANEOUS
  Filled 2016-05-25 (×5): qty 0.4

## 2016-05-25 MED ORDER — INSULIN ASPART 100 UNIT/ML ~~LOC~~ SOLN
0.0000 [IU] | Freq: Three times a day (TID) | SUBCUTANEOUS | Status: DC
  Administered 2016-05-26 – 2016-05-28 (×3): 3 [IU] via SUBCUTANEOUS
  Administered 2016-05-30 – 2016-06-02 (×4): 2 [IU] via SUBCUTANEOUS
  Administered 2016-06-03: 3 [IU] via SUBCUTANEOUS
  Administered 2016-06-04: 2 [IU] via SUBCUTANEOUS
  Administered 2016-06-04: 3 [IU] via SUBCUTANEOUS
  Administered 2016-06-05: 5 [IU] via SUBCUTANEOUS
  Administered 2016-06-05 – 2016-06-06 (×3): 2 [IU] via SUBCUTANEOUS
  Administered 2016-06-07 (×2): 3 [IU] via SUBCUTANEOUS
  Administered 2016-06-08: 2 [IU] via SUBCUTANEOUS
  Administered 2016-06-09: 5 [IU] via SUBCUTANEOUS
  Administered 2016-06-09: 2 [IU] via SUBCUTANEOUS
  Administered 2016-06-10: 8 [IU] via SUBCUTANEOUS
  Administered 2016-06-11 – 2016-06-13 (×2): 2 [IU] via SUBCUTANEOUS
  Administered 2016-06-13 – 2016-06-14 (×3): 3 [IU] via SUBCUTANEOUS
  Administered 2016-06-14: 2 [IU] via SUBCUTANEOUS
  Administered 2016-06-15 (×2): 3 [IU] via SUBCUTANEOUS
  Administered 2016-06-15: 5 [IU] via SUBCUTANEOUS
  Administered 2016-06-16 (×2): 3 [IU] via SUBCUTANEOUS

## 2016-05-25 MED ORDER — ATORVASTATIN CALCIUM 40 MG PO TABS: 40.0000 mg | ORAL_TABLET | Freq: Every day | ORAL | 0 refills | Status: DC

## 2016-05-25 MED ORDER — METOPROLOL SUCCINATE ER 50 MG PO TB24
50.0000 mg | ORAL_TABLET | Freq: Every day | ORAL | Status: DC
  Administered 2016-05-26 – 2016-05-29 (×4): 50 mg via ORAL
  Filled 2016-05-25 (×6): qty 1

## 2016-05-25 MED ORDER — POTASSIUM CHLORIDE CRYS ER 20 MEQ PO TBCR
20.0000 meq | EXTENDED_RELEASE_TABLET | Freq: Two times a day (BID) | ORAL | Status: DC
  Administered 2016-05-25 – 2016-05-29 (×9): 20 meq via ORAL
  Filled 2016-05-25 (×10): qty 1

## 2016-05-25 MED ORDER — ACETAMINOPHEN 325 MG PO TABS: 650.0000 mg | ORAL_TABLET | Freq: Four times a day (QID) | ORAL | Status: DC | PRN

## 2016-05-25 MED ORDER — ATORVASTATIN CALCIUM 40 MG PO TABS
40.0000 mg | ORAL_TABLET | Freq: Every day | ORAL | Status: DC
  Administered 2016-05-26 – 2016-06-15 (×20): 40 mg via ORAL
  Filled 2016-05-25 (×17): qty 1
  Filled 2016-05-25: qty 2
  Filled 2016-05-25 (×3): qty 1

## 2016-05-25 MED ORDER — INSULIN ASPART 100 UNIT/ML ~~LOC~~ SOLN
0.0000 [IU] | Freq: Every day | SUBCUTANEOUS | Status: DC
  Administered 2016-06-02 – 2016-06-12 (×8): 2 [IU] via SUBCUTANEOUS
  Administered 2016-06-13 – 2016-06-14 (×2): 3 [IU] via SUBCUTANEOUS
  Administered 2016-06-15: 5 [IU] via SUBCUTANEOUS

## 2016-05-25 MED ORDER — STARCH (THICKENING) PO POWD: 1.0000 g | ORAL | 0 refills | Status: DC | PRN

## 2016-05-25 MED ORDER — HYDROCHLOROTHIAZIDE 25 MG PO TABS: 25.0000 mg | ORAL_TABLET | Freq: Every day | ORAL | 0 refills | Status: DC

## 2016-05-25 MED ORDER — METOPROLOL SUCCINATE ER 50 MG PO TB24: 50.0000 mg | ORAL_TABLET | Freq: Every day | ORAL | 0 refills | Status: DC

## 2016-05-25 MED ORDER — SULFAMETHOXAZOLE-TRIMETHOPRIM 800-160 MG PO TABS
1.0000 | ORAL_TABLET | Freq: Two times a day (BID) | ORAL | Status: AC
  Administered 2016-05-25 – 2016-05-31 (×12): 1 via ORAL
  Filled 2016-05-25 (×14): qty 1

## 2016-05-25 MED ORDER — LISINOPRIL 20 MG PO TABS: 20.0000 mg | ORAL_TABLET | Freq: Every day | ORAL | 0 refills | Status: DC

## 2016-05-25 MED ORDER — ASPIRIN 81 MG PO CHEW: 81.0000 mg | CHEWABLE_TABLET | Freq: Every day | ORAL | 0 refills | Status: AC

## 2016-05-25 MED ORDER — FLEET ENEMA 7-19 GM/118ML RE ENEM: 1.0000 | ENEMA | Freq: Once | RECTAL | Status: DC | PRN

## 2016-05-25 MED ORDER — ASPIRIN 81 MG PO CHEW
81.0000 mg | CHEWABLE_TABLET | Freq: Every day | ORAL | Status: DC
  Administered 2016-05-26 – 2016-06-16 (×22): 81 mg via ORAL
  Filled 2016-05-25 (×22): qty 1

## 2016-05-25 MED ORDER — SULFAMETHOXAZOLE-TRIMETHOPRIM 800-160 MG PO TABS: 1.0000 | ORAL_TABLET | Freq: Two times a day (BID) | ORAL | 0 refills | Status: DC

## 2016-05-25 MED ORDER — CLOPIDOGREL BISULFATE 75 MG PO TABS: 75.0000 mg | ORAL_TABLET | Freq: Every day | ORAL | 0 refills | Status: DC

## 2016-05-25 MED ORDER — STARCH (THICKENING) PO POWD
ORAL | Status: DC | PRN
  Filled 2016-05-25: qty 227

## 2016-05-25 MED ORDER — PROCHLORPERAZINE 25 MG RE SUPP: 12.5000 mg | Freq: Four times a day (QID) | RECTAL | Status: DC | PRN

## 2016-05-25 MED ORDER — SENNOSIDES-DOCUSATE SODIUM 8.6-50 MG PO TABS: 1.0000 | ORAL_TABLET | Freq: Every evening | ORAL | Status: DC | PRN

## 2016-05-25 MED ORDER — INSULIN NPH (HUMAN) (ISOPHANE) 100 UNIT/ML ~~LOC~~ SUSP: 5.0000 [IU] | Freq: Two times a day (BID) | SUBCUTANEOUS | 11 refills | Status: DC

## 2016-05-25 MED ORDER — LISINOPRIL 20 MG PO TABS
20.0000 mg | ORAL_TABLET | Freq: Every day | ORAL | Status: DC
  Administered 2016-05-26 – 2016-05-29 (×4): 20 mg via ORAL
  Filled 2016-05-25 (×5): qty 1

## 2016-05-25 MED ORDER — GLIMEPIRIDE 2 MG PO TABS
1.0000 mg | ORAL_TABLET | Freq: Every day | ORAL | Status: DC
  Administered 2016-05-26 – 2016-05-29 (×4): 1 mg via ORAL
  Filled 2016-05-25 (×5): qty 1

## 2016-05-25 MED ORDER — DIPHENHYDRAMINE HCL 12.5 MG/5ML PO ELIX: 12.5000 mg | ORAL_SOLUTION | Freq: Four times a day (QID) | ORAL | Status: DC | PRN

## 2016-05-25 MED ORDER — HYDROCHLOROTHIAZIDE 25 MG PO TABS
25.0000 mg | ORAL_TABLET | Freq: Every day | ORAL | Status: DC
  Administered 2016-05-26 – 2016-05-29 (×4): 25 mg via ORAL
  Filled 2016-05-25 (×5): qty 1

## 2016-05-25 MED ORDER — CLOPIDOGREL BISULFATE 75 MG PO TABS
75.0000 mg | ORAL_TABLET | Freq: Every day | ORAL | Status: DC
  Administered 2016-05-26 – 2016-06-16 (×22): 75 mg via ORAL
  Filled 2016-05-25 (×22): qty 1

## 2016-05-25 MED ORDER — ACETAMINOPHEN 325 MG PO TABS
325.0000 mg | ORAL_TABLET | ORAL | Status: DC | PRN
  Administered 2016-05-27: 325 mg via ORAL
  Administered 2016-06-04: 650 mg via ORAL
  Filled 2016-05-25 (×2): qty 2

## 2016-05-25 MED ORDER — GABAPENTIN 300 MG PO CAPS
300.0000 mg | ORAL_CAPSULE | Freq: Two times a day (BID) | ORAL | Status: DC
  Administered 2016-05-25 – 2016-06-16 (×43): 300 mg via ORAL
  Filled 2016-05-25 (×44): qty 1

## 2016-05-25 MED ORDER — ALUM & MAG HYDROXIDE-SIMETH 200-200-20 MG/5ML PO SUSP
30.0000 mL | ORAL | Status: DC | PRN
  Administered 2016-05-30 (×2): 30 mL via ORAL
  Filled 2016-05-25 (×2): qty 30

## 2016-05-25 MED ORDER — RESOURCE THICKENUP CLEAR PO POWD
ORAL | Status: DC | PRN
  Filled 2016-05-25: qty 125

## 2016-05-25 MED ORDER — METOPROLOL SUCCINATE ER 50 MG PO TB24
50.0000 mg | ORAL_TABLET | Freq: Every day | ORAL | Status: DC
  Administered 2016-05-25: 50 mg via ORAL
  Filled 2016-05-25: qty 1

## 2016-05-25 MED ORDER — POTASSIUM CHLORIDE CRYS ER 20 MEQ PO TBCR
40.0000 meq | EXTENDED_RELEASE_TABLET | Freq: Once | ORAL | Status: AC
  Administered 2016-05-25: 40 meq via ORAL
  Filled 2016-05-25: qty 2

## 2016-05-25 MED ORDER — TRAZODONE HCL 50 MG PO TABS
25.0000 mg | ORAL_TABLET | Freq: Every evening | ORAL | Status: DC | PRN
  Administered 2016-05-28 – 2016-06-06 (×3): 50 mg via ORAL
  Filled 2016-05-25 (×4): qty 1

## 2016-05-25 NOTE — Progress Notes (Signed)
Patient ID: Paige Stewart, female   DOB: 09-19-1950, 66 y.o.   MRN: [id number] Patient admitted to [phone] via bed, escorted by nursing staff and family.  Patient and family oriented to rehab process.  Appears to be in no distress at this time.  Dani Gobble, RN

## 2016-05-25 NOTE — Progress Notes (Signed)
Results for ARNICE, VANEPPS (MRN [id number]) as of 05/25/2016 13:56  Ref. Range 05/24/2016 20:47 05/25/2016 00:42 05/25/2016 04:51 05/25/2016 07:17 05/25/2016 12:01  Glucose-Capillary Latest Ref Range: 65 - 99 mg/dL 782 (H) 956 (H) 213 (H) 218 (H) 293 (H)  Noted blood sugars continue to be greater than 180 mg/dl.  Recommend increasing NPH to 8 units BID and continue Novolog correction scale as ordered. Will continue to monitor blood sugars while in the hospital.   Smith Mince RN BSN CDE Diabetes Coordinator Pager: [phone]  8am-5pm

## 2016-05-25 NOTE — Progress Notes (Signed)
Rehab admissions - I met with patient, her daughter and her son-in-law at the bedside.  All are in favor of inpatient rehab admission.  I have clearance from attending MD for inpatient rehab admission.  Bed available and will admit to acute inpatient rehab today.  Call me for questions.  #[phone]

## 2016-05-25 NOTE — Progress Notes (Signed)
Family Medicine Teaching Service Daily Progress Note Intern Pager: [phone]  Patient name: Paige Stewart Medical record number: [id number] Date of birth: May 09, 1950 Age: 66 y.o. Gender: female  Primary Care Provider: Pcp Not In System Consultants: Cardiology, Neurology  Code Status: Partial. Patient does not want to be intubated.   Chief Complaint: right sided weakness, right facial droop, and dysarthria.   Assessment and Plan: Paige Stewart is a 66 y.o. female presenting with right sided weakness, right facial droop, dysarthria. PMH is significant for DM2, HTN, diabetic neuropathy.   Acute basal ganglia infarct - with Right Sided Weakness, facial droop and dysarthria: CT perfusion with small area of penumbra in the anterior MCA per neurology. MR brain with acute basal ganglia infarct.  - ASA  PR  - Atorvastatin 40 mg Qd - frequent neuro checks - neuro signed off f/u w neuro in 3 weeks after discharge  Elevated Troponin, improving: trend  istat 1.91> >>>4.13>3.94 and EKG changes thought to be secondary to stroke. No chest pain or shortness of breath. Echo completed with anterior wall motion abnl, unsure if acute or chronic - cardiology signed off - consider outpatient NUC stress in a few months if pt still here - likely no loop recorder; patient going back to [place] - continue dual platelet with ASA and plavix 75 mg Qd - to continue dual therapy for one year s/p this acute event  T2DM, poorly controlled HbA1c 10.3 On metformin at home. Overnight CBG 218 requiring 5u sliding scale - holding metformin - SSI insulin - started NPH ie 5 mg BID  - diabetes teaching completed - continue home gabapentin  Hypokalemia, low at 3.4 today (4/5) repleted - AM BMP - recheck BMP after discharge too  Pyelonephritis, Sn to Bactrim - Stable, patient s/p two days of antibiotics. 100k E Coli. Cultures returned resistant to keflex and cipro but sensitive to bactrim. - s/p keflex 500 mg BID   (4/3 -4/4 ), ciprofloxacin 500 mg BID (4/4-4/5) - Start Bactrim 800 - 160 mg q12H x7 days (4/5 -4/11) - monitor fever curve - holding tylenol for now   HTN: still hypertensive, BP 170-180/90-104 overnight  Home medications: lisinopril 10 mg Qd and metoprolol 25 mg BID.  - vitals per protocol - inc home lisinopril 20 mg QD - continue HCTZ 25 mg Qd  - Increase Toprol to 50 mg Qd  Small to Moderate Pericardial Effusion: noted on CT mildly increased since 04/2016.  - ECHO - small,  fluid had no internal echoes.There was no evidence of hemodynamic compromise.  Vascular Disease: noted on CT with three vessel coronary atherosclerosis and moderate stenosis of the proximal right renal artery and IMA.   FEN/GI: dysphagia 1 with honey thickened liquids Prophylaxis: SCDs    Disposition: Waiting CIR placement  Subjective:  Patient improved mentation this morning, alert. Remains dysphagic.  Objective: Temp:  [97.9 F (36.6 C)-99 F (37.2 C)] 98.5 F (36.9 C) (04/05 0717) Pulse Rate:  [75-110] 110 (04/05 0904) Resp:  [15-22] 20 (04/05 0904) BP: (153-184)/(85-106) 163/90 (04/05 0904) SpO2:  [95 %-100 %] 99 % (04/05 0904) Weight:  [67 kg (147 lb 12.8 oz)] 67 kg (147 lb 12.8 oz) (04/05 0443) Physical Exam: General: NAD, comfortable Cardiovascular: RRR, no m/r/g Respiratory: CTA bil, no W/R/R Abdomen: soft and nontender, non-distended Extremities: no LE edema, warm and well-perfused, no skin breakdown or lesions Neuro: +Dysarthria, Cn 2-4 intact. Decreased movement of right side of face. CN 6 intact. CN 8-12 normal except right shoulder shrug  decreased.  R arm and leg weakness (1/5), R arm and R leg strength normal 4/5 bilaterally   Laboratory:  Important labs this admission CK WNL Urine Cx: 100,000 colonies E coli  Lipid Panel     Component Value Date/Time   CHOL 168 05/22/2016 0006   TRIG 81 05/22/2016 0006   HDL 34 (L) 05/22/2016 0006   CHOLHDL 4.9 05/22/2016 0006   VLDL 16  05/22/2016 0006   LDLCALC 118 (H) 05/22/2016 0006      Recent Labs Lab 05/23/16 0511 05/24/16 0512 05/25/16 0404  WBC 6.3 4.1 8.8  HGB 13.1 12.3 12.5  HCT 37.1 36.0 36.5  PLT 258 243 254    Recent Labs Lab 05/21/16 0953  05/23/16 0511 05/24/16 0512 05/25/16 0404  NA 137  < > 137 137 136  K 3.8  < > 3.0* 4.0 3.4*  CL 101  < > 105 109 104  CO2 27  < > BUN 14  < > CREATININE 0.76  < > 0.64 0.69 0.64  CALCIUM 9.3  < > 8.7* 8.6* 8.9  PROT 7.2  --   --   --   --   BILITOT 0.4  --   --   --   --   ALKPHOS 51  --   --   --   --   ALT 17  --   --   --   --   AST 21  --   --   --   --   GLUCOSE 254*  < > 155* 220* 242*  < > = values in this interval not displayed.    Imaging/Diagnostic Tests: EKG with T wave inversion in 1, aVL and V2 with mild ST elevation in V2.  Echo 4/3 - Left ventricle: The cavity size was normal. There was mild focal   basal hypertrophy of the septum. Systolic function was mildly to   moderately reduced. The estimated ejection fraction was in the   range of 40% to 45%. Severe hypokinesis of the   mid-apicalanteroseptal and anterior myocardium; consistent with   ischemia in the distribution of the left anterior descending   coronary artery. Akinesis of the apical myocardium. There was   fusion of early and atrial contributions to ventricular filling.   The study is not technically sufficient to allow evaluation of LV   diastolic function. No evidence of thrombus. - Atrial septum: Echo contrast study showed no right-to-left atrial   level shunt, at baseline or with provocation. - Pericardium, extracardiac: A small pericardial effusion was   identified anterior to the heart. The fluid had no internal   echoes.There was no evidence of hemodynamic compromise.  TEE 4/3 - Left ventricle: There is akinesis of the mid anteroseptal,   anterior and apical septal and anterior walls. Wall thickness was   increased in a pattern of  moderate LVH. Systolic function was   mildly reduced. The estimated ejection fraction was in the range   of 45% to 50%. - Aorta: There was moderate non-mobile atheroma. - Descending aorta: The descending aorta was normal in size. - Mitral valve: There was no significant regurgitation. - Left atrium: No evidence of thrombus in the atrial cavity or   appendage. No evidence of thrombus in the atrial cavity or   appendage. - Right ventricle: Systolic function was normal. - Right atrium: Chiari network seen in the right atrium. No   evidence of thrombus in  the atrial cavity or appendage. - Atrial septum: No defect or patent foramen ovale was identified. - Tricuspid valve: There was no significant regurgitation. - Pericardium, extracardiac: A mild pericardial effusion was   identified. No tamponade.  Impressions: - No cardiac source of emboli was indentified.   CT ANGIOGRAPHY HEAD AND NECK CT PERFUSION BRAIN TECHNIQUE - 05/21/2016:  FINDINGS: CTA NECK FINDINGS Aortic arch: Minimal atherosclerotic disease in the aortic arch. Proximal great vessels tortuous but widely patent. Right carotid system: Right common carotid artery widely patent. Right internal carotid artery widely patent. Severe stenosis at the origin of the right external carotid artery Left carotid system: Left common carotid artery widely patent with mild atherosclerotic disease. Mild atherosclerotic calcification of the left carotid bifurcation without significant stenosis. Vertebral arteries: Both vertebral arteries patent without significant stenosis. Skeleton: Scoliosis. No acute skeletal abnormality. Minimal degenerative change in the cervical spine. Other neck: Multiple small thyroid nodules. No mass or adenopathy in the neck. Upper chest: Lung apices clear. Review of the MIP images confirms the above findings   CTA HEAD FINDINGS  Anterior circulation: Mild atherosclerotic disease in the right cavernous carotid artery without  significant stenosis. Right anterior cerebral artery widely patent. Right M1 widely patent. Right middle cerebral artery branches widely patent without stenosis. Mild atherosclerotic calcification left cavernous carotid without stenosis. Left anterior cerebral artery widely patent. Left M1 segment is widely patent. Anterior branch of the left middle cerebral artery shows decreased density suggesting hypoperfusion. There appears to be a stenosis at the origin of this M3 branch of the left middle cerebral artery supplying the left frontotemporal lobe. Posterior circulation: Both vertebral arteries patent to the basilar. Basilar patent. PICA, superior cerebellar, posterior cerebral arteries patent without significant stenosis or occlusion. Venous sinuses: Patent Anatomic variants: None Delayed phase: Not performed Review of the MIP images confirms the above findings CT Brain Perfusion Findings: CBF (<30%) Volume: 0mL Perfusion (Tmax>6.0s) volume: 23mL Mismatch Volume: 23mL Infarction Location:Left frontotemporal lobe and left frontal operculum  IMPRESSION: 23 mL volume of hypoperfusion in the left frontotemporal lobe and left frontal operculum without fixed infarct by CT perfusion Hypoperfusion of the anterior branch of left MCA supplying the above area of infarction. This appears to be a left M3 branch which may have a severe stenosis. Left M1 is widely patent. No significant vertebral or carotid artery stenosis in the neck.  Ct Angio Neck W And/or Wo Contrast  05/21/2016 CTA NECK FINDINGS Aortic arch: Minimal atherosclerotic disease in the aortic arch. Proximal great vessels tortuous but widely patent. Right carotid system: Right common carotid artery widely patent. Right internal carotid artery widely patent. Severe stenosis at the origin of the right external carotid artery Left carotid system: Left common carotid artery widely patent with mild atherosclerotic disease. Mild atherosclerotic calcification of the  left carotid bifurcation without significant stenosis. Vertebral arteries: Both vertebral arteries patent without significant stenosis. Skeleton: Scoliosis. No acute skeletal abnormality. Minimal degenerative change in the cervical spine. Other neck: Multiple small thyroid nodules. No mass or adenopathy in the neck. Upper chest: Lung apices clear. Review of the MIP images confirms the above findings   CTA HEAD FINDINGS Anterior circulation: Mild atherosclerotic disease in the right cavernous carotid artery without significant stenosis. Right anterior cerebral artery widely patent. Right M1 widely patent. Right middle cerebral artery branches widely patent without stenosis. Mild atherosclerotic calcification left cavernous carotid without stenosis. Left anterior cerebral artery widely patent. Left M1 segment is widely patent. Anterior branch of the left middle  cerebral artery shows decreased density suggesting hypoperfusion. There appears to be a stenosis at the origin of this M3 branch of the left middle cerebral artery supplying the left frontotemporal lobe. Posterior circulation: Both vertebral arteries patent to the basilar. Basilar patent. PICA, superior cerebellar, posterior cerebral arteries patent without significant stenosis or occlusion. Venous sinuses: Patent Anatomic variants: None Delayed phase: Not performed Review of the MIP images confirms the above findings   CT Brain Perfusion Findings: CBF (<30%) Volume: 0mL Perfusion (Tmax>6.0s) volume: 23mL Mismatch Volume: 23mL Infarction Location:Left frontotemporal lobe and left frontal operculum   IMPRESSION: 23 mL volume of hypoperfusion in the left frontotemporal lobe and left frontal operculum without fixed infarct by CT perfusion Hypoperfusion of the anterior branch of left MCA supplying the above area of infarction. This appears to be a left M3 branch which may have a severe stenosis. Left M1 is widely patent. No significant vertebral or carotid  artery stenosis in the neck.   Mr Brain Wo Contrast 05/21/2016 FINDINGS: Brain: There is an acute left lateral lenticulostriate territory infarct extending from the body of the left caudate nucleus into the posterior left lentiform nucleus and external capsule region. There is no evidence of associated hemorrhage. No mass, midline shift, or extra-axial fluid collection is seen. There is mild cerebral atrophy. Patchy T2 hyperintensities throughout the subcortical and deep cerebral white matter bilaterally are nonspecific but compatible with moderate chronic small vessel ischemic disease. There is a chronic lacunar infarct in the left caudate. Vascular: Major intracranial vascular flow voids are preserved. Skull and upper cervical spine: Unremarkable bone marrow signal. Sinuses/Orbits: Unremarkable orbits.  No significant sinus disease. Other: None.  IMPRESSION:  1. Acute left basal ganglia infarct.  2. Moderate chronic small vessel ischemic disease. Ct Cerebral Perfusion W Contrast  CT ANGIOGRAPHY HEAD AND NECK CT PERFUSION BRAIN TECHNIQUE FINDINGS: CTA NECK FINDINGS 05/21/2016 Aortic arch: Minimal atherosclerotic disease in the aortic arch. Proximal great vessels tortuous but widely patent. Right carotid system: Right common carotid artery widely patent. Right internal carotid artery widely patent. Severe stenosis at the origin of the right external carotid artery Left carotid system: Left common carotid artery widely patent with mild atherosclerotic disease. Mild atherosclerotic calcification of the left carotid bifurcation without significant stenosis. Vertebral arteries: Both vertebral arteries patent without significant stenosis. Skeleton: Scoliosis. No acute skeletal abnormality. Minimal degenerative change in the cervical spine. Other neck: Multiple small thyroid nodules. No mass or adenopathy in the neck. Upper chest: Lung apices clear. Review of the MIP images confirms the above findings   CTA HEAD  FINDINGS Anterior circulation: Mild atherosclerotic disease in the right cavernous carotid artery without significant stenosis. Right anterior cerebral artery widely patent. Right M1 widely patent. Right middle cerebral artery branches widely patent without stenosis. Mild atherosclerotic calcification left cavernous carotid without stenosis. Left anterior cerebral artery widely patent. Left M1 segment is widely patent. Anterior branch of the left middle cerebral artery shows decreased density suggesting hypoperfusion. There appears to be a stenosis at the origin of this M3 branch of the left middle cerebral artery supplying the left frontotemporal lobe. Posterior circulation: Both vertebral arteries patent to the basilar. Basilar patent. PICA, superior cerebellar, posterior cerebral arteries patent without significant stenosis or occlusion. Venous sinuses: Patent Anatomic variants: None Delayed phase: Not performed Review of the MIP images confirms the above findings CT Brain Perfusion Findings: CBF (<30%) Volume: 0mL Perfusion (Tmax>6.0s) volume: 23mL Mismatch Volume: 23mL Infarction Location:Left frontotemporal lobe and left frontal operculum  IMPRESSION: 23  mL volume of hypoperfusion in the left frontotemporal lobe and left frontal operculum without fixed infarct by CT perfusion Hypoperfusion of the anterior branch of left MCA supplying the above area of infarction. This appears to be a left M3 branch which may have a severe stenosis. Left M1 is widely patent. No significant vertebral or carotid artery stenosis in the neck.      Howard Pouch, MD 05/25/2016, 9:47 AM PGY-1, [facility] FPTS Intern pager: [phone], text pages welcome

## 2016-05-25 NOTE — Progress Notes (Signed)
Paige Diones, RN Rehab Admission Coordinator Signed Physical Medicine and Rehabilitation  PMR Pre-admission Date of Service: 05/25/2016 12:37 PM  Related encounter: ED to Hosp-Admission (Current) from 05/21/2016 in [place]       [] Hide copied text PMR Admission Coordinator Pre-Admission Assessment  Patient: Paige Stewart is an 66 y.o., female MRN: [id number] DOB: 1950/05/17 Height:   Weight: 67 kg (147 lb 12.8 oz)                                                                                                                                      Insurance Information HMO:    PPO:       PCP:       IPA:       80/20:       OTHER:   PRIMARY:  PHCS      Policy#: [id number]      Subscriber:  Paige Stewart CM Name:        Phone#:       Fax#:   Pre-Cert#:        Employer: Retired Benefits:  Phone #: [phone]     Name:  Thurston Hole. Date:  05/03/16     Deduct:  $0      Out of Pocket Max:  $500 (met $0)      Life Max:  $110,000 CIR:  100% in network/80% out of network      SNF:  100% in network/80% out of network Outpatient: 15 visits PT     Co-Pay:  $60/visit Home Health: 100% in/ 80% out      Co-Pay: 20% out of network DME: 80% (100% in network) for std. w/c or hospital bed only     Co-Pay: 20% Providers:  In network covers 100% and out of network covers 66%  Medicaid Application Date:        Case Manager:   Disability Application Date:        Case Worker:    Emergency Contact Information        Contact Information    Name Relation Home Work [place] Daughter   [phone]   Phiri,Robert "Mikki Santee" Relative   [phone]     Current Medical History  Patient Admitting Diagnosis:   History of Present Illness:A 66 y.o.RH-female with history of DM with neuropathy, HTN--medication noncompliance, who was found down by family on 05/21/16 with right sided weakness. EKG with ST changes and positive cardiac enzymes. 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 CTA chest/pelvis done due to concerns of aortic dissection and was negative for acute aortic syndrome with incidental finding of moderate stenosis proxima. R-RA. 2 D echo with EF 40-45% with severe hypokinesis of mid apical anteroseptal and anterior myocardium c/w with ischemia  in distribution of LAD. Cardiology consulted for input and recommended outpatient work up for demand ischemia. TEE negative for intracardiac embolic source. Dr. Leonie Man recommended ASA for stroke due to unknown source. Therapy evaluations completed yesterday and patient limited by lethargy, right sided weakness, left gaze preference, expressive/receptive aphasia, dysphagia. CIR recommended for follow up therapy.   Patient (from [place] been here since November with plans to go back home in 3 weeks. Daughter works but has been off due to surgery.   Total: 20=NIH  Past Medical History      Past Medical History:  Diagnosis Date  . Diabetes mellitus without complication ([place])   . Hypertension   . Neuropathy ([place])     Family History  family history includes Stroke in her sister.  Prior Rehab/Hospitalizations: No previous rehab  Has the patient had major surgery during 100 days prior to admission? No  Current Medications   Current Facility-Administered Medications:  .  acetaminophen (TYLENOL) tablet 650 mg, 650 mg, Oral, Q6H PRN, Everrett Coombe, MD, 650 mg at 05/25/16 0908 .  aspirin chewable tablet 81 mg, 81 mg, Oral, Daily, Dickie La, MD, 81 mg at 05/25/16 0909 .  atorvastatin (LIPITOR) tablet 40 mg, 40 mg, Oral, q1800, Donzetta Starch, NP, 40 mg at 05/24/16 1721 .  clopidogrel (PLAVIX) tablet 75 mg, 75 mg, Oral, Daily, Everrett Coombe, MD, 75 mg at 05/25/16 0908 .  food thickener (THICK IT) powder, , Oral, PRN, Dickie La, MD .  gabapentin (NEURONTIN) capsule 300 mg, 300 mg, Oral, BID, Everrett Coombe, MD, 300 mg at 05/25/16 0910 .  hydrALAZINE (APRESOLINE) injection 5 mg, 5 mg, Intravenous, Q6H PRN, Smiley Houseman, MD .  hydrochlorothiazide (HYDRODIURIL) tablet 25 mg, 25 mg, Oral, Daily, Everrett Coombe, MD, 25 mg at 05/25/16 0909 .  insulin aspart (novoLOG) injection 0-9 Units, 0-9 Units, Subcutaneous, TID WC, Everrett Coombe, MD, 3 Units at 05/25/16 0910 .  insulin NPH Human (HUMULIN N,NOVOLIN N) injection 5 Units, 5 Units, Subcutaneous, BID AC & HS, Everrett Coombe, MD, 5 Units at 05/24/16 2341 .  lisinopril (PRINIVIL,ZESTRIL) tablet 20 mg, 20 mg, Oral, Daily, Everrett Coombe, MD, 20 mg at 05/25/16 0908 .  metoprolol succinate (TOPROL-XL) 24 hr tablet 50 mg, 50 mg, Oral, Daily, Everrett Coombe, MD, 50 mg at 05/25/16 0908 .  senna-docusate (Senokot-S) tablet 1 tablet, 1 tablet, Oral, QHS PRN, Smiley Houseman, MD, 1 tablet at 05/24/16 0815 .  simethicone (MYLICON) chewable tablet 80 mg, 80 mg, Oral, QID PRN, Bufford Lope, DO, 80 mg at 05/24/16 2251 .  sulfamethoxazole-trimethoprim (BACTRIM DS,SEPTRA DS) 800-160 MG per tablet 1 tablet, 1 tablet, Oral, Q12H, Jake Church Masters, RPH  Patients Current Diet: DIET - DYS 1 Room service appropriate? Yes; Fluid consistency: Nectar Thick Diet - low sodium heart healthy  Precautions / Restrictions Precautions Precautions: Fall Restrictions Weight Bearing Restrictions: No   Has the patient had 2 or more falls or a fall with injury in the past year?No  Prior Activity Level Community (5-7x/wk): Went out daily, walked a lot, not driving.  Home Assistive Devices / Equipment Home Assistive Devices/Equipment: Eyeglasses  Prior Device Use: Indicate devices/aids used by the patient prior to current illness, exacerbation or injury? None  Prior Functional Level Prior Function Level of Independence: Independent Comments: Per daughter, pt independent with ADL PTA.  Self  Care: Did the patient need help bathing, dressing, using the toilet or eating?  Independent  Indoor Mobility: Did the patient need assistance with  walking from room to room (with or without device)? Independent  Stairs: Did the patient need assistance with internal or external stairs (with or without device)? Independent  Functional Cognition: Did the patient need help planning regular tasks such as shopping or remembering to take medications? Independent  Current Functional Level Cognition  Arousal/Alertness: Awake/alert Overall Cognitive Status: Difficult to assess Difficult to assess due to: Impaired communication Current Attention Level: Sustained Orientation Level: Other (comment) (UTA) Following Commands: Follows one step commands inconsistently, Follows one step commands with increased time Safety/Judgement: Decreased awareness of deficits General Comments: Pt inconsistently responding to questions and following commands. Pt able to verbalize her full name. Nodding yes/no to questions at start of session. Attention: Focused Focused Attention: Appears intact    Extremity Assessment (includes Sensation/Coordination)  Upper Extremity Assessment: RUE deficits/detail RUE Deficits / Details: Trace scapular elevation noted, otherwise no active movement noted. Full PROM. Does not withdraw to noxious stimuli. RUE Sensation: decreased light touch RUE Coordination: decreased fine motor, decreased gross motor  Lower Extremity Assessment: Defer to PT evaluation RLE Deficits / Details: RLE hemiplegia- able to stand without R knee buckling, although not bearing full weight on RLE.  RLE Sensation: decreased light touch    ADLs  Overall ADL's : Needs assistance/impaired Eating/Feeding: Minimal assistance, Sitting Eating/Feeding Details (indicate cue type and reason): Pt able to take sips of water using L hand during grooming activity. Grooming: Moderate assistance, Sitting, Wash/dry  face, Oral care, Cueing for sequencing Grooming Details (indicate cue type and reason): Consistent cues for sequencing Upper Body Bathing: Moderate assistance, Sitting Lower Body Bathing: Maximal assistance Upper Body Dressing : Moderate assistance, Sitting Lower Body Dressing: Maximal assistance Toilet Transfer Details (indicate cue type and reason): Mod +2 for sit to stand in [place]; total assist for transfer. Functional mobility during ADLs: Moderate assistance, +2 for physical assistance (for sit to stand in [place])    Mobility  Overal bed mobility: Needs Assistance Bed Mobility: Rolling, Sidelying to Sit Rolling: Mod assist Sidelying to sit: Max assist, +2 for physical assistance Supine to sit: Max assist General bed mobility comments: Cues for hand placement on bed rail for rolling. Assist for LEs to EOB and for trunk elevation to sit. Assist to scoot hips out to EOB with use of bed pad, 2nd person steadying assist due to poor sitting balance.    Transfers  Overall transfer level: Needs assistance Equipment used: Ambulation equipment used (stedy) Transfer via Lift Equipment: Stedy Transfers: Sit to/from Stand, Risk manager Sit to Stand: Mod assist, +2 physical assistance Stand pivot transfers: Total assist, +2 physical assistance General transfer comment: Mod assist +2 for initial sit to stand from EOB with use of Stedy; assist for R UE and to boost up. From [facility] pt able to stand with mod assist +1 for RUE support. Total assist for pivot transfer to chair in [place].    Ambulation / Gait / Stairs / Office manager / Balance Dynamic Sitting Balance Sitting balance - Comments: Max-min assist for sitting balance. Significant R lateral lean. Balance Overall balance assessment: Needs assistance Sitting-balance support: Single extremity supported, Feet supported Sitting balance-Leahy Scale: Poor Sitting balance - Comments: Max-min assist for sitting  balance. Significant R lateral lean. Postural control: Right lateral lean Standing balance support: Bilateral upper extremity supported Standing balance-Leahy Scale: Zero Standing balance comment: in Stedy    Special needs/care consideration BiPAP/CPAP No CPM No Continuous Drip IV No Dialysis No  Life Vest No Oxygen No Special Bed No Trach Size No Wound Vac (area) No     Skin No                             Bowel mgmt: Last BM 05/24/16 with incontinence Bladder mgmt: Incontinent Diabetic mgmt Yes, on oral medications Contact isolation:  Yes    Previous Home Environment Living Arrangements: Children Available Help at Discharge: Family Type of Home: Apartment Home Access: Stairs to enter Technical brewer of Steps: 3 flights Home Care Services: No Additional Comments: Pt from [place], visiting daughter in [place]; daughter lives in 3rd floor apartment.   Discharge Living Setting Plans for Discharge Living Setting: Lives with (comment), Apartment (Plans home with dtr and son in law.) Type of Home at Discharge: Apartment (3rd level apartment.) Discharge Home Layout: One level (On the 3rd level.) Discharge Home Access: Stairs to enter Entrance Stairs-Number of Steps: 32 steps to 3rd level apartment.  Likely will need ambulance at D/ (Likely will need ambulance at DC to get in apartment.) Does the patient have any problems obtaining your medications?: No  Social/Family/Support Systems Patient Roles: Parent, Other (Comment) (Has a daughter and a son-in-law.) Contact Information: Shavanna Furnari - daughter - [phone] Anticipated Caregiver: Daughter, son in law and others Anticipated Caregiver's Contact Information: Jed Limerick - son in law [phone] Ability/Limitations of Caregiver: Dtr and son in Sports coach both work.  They are trying to arrance for caregivers while they work. Caregiver Availability: Other (Comment) (Family aware of need for supervision and light care at  D/C) Discharge Plan Discussed with Primary Caregiver: Yes Is Caregiver In Agreement with Plan?: Yes Does Caregiver/Family have Issues with Lodging/Transportation while Pt is in Rehab?: No  Goals/Additional Needs Patient/Family Goal for Rehab: PT/OT min assist, SLP mod I goals Expected length of stay: 19-22 days Cultural Considerations: From [place], [place], is catholic, speaks [place].  Privacy is very important.  Ask first if okay for female to assist.  Be sure patient is covered up. Dietary Needs: Dys1, nectar thick liquids Equipment Needs: TBD Additional Information: Per Dr. Letta Pate, will not be ready to return to [place] in 3 weeks. Has been in the [place] since November, 2017 with daughter.  Pt/Family Agrees to Admission and willing to participate: Yes Program Orientation Provided & Reviewed with Pt/Caregiver Including Roles  & Responsibilities: Yes  Decrease burden of Care through IP rehab admission: N/A  Possible need for SNF placement upon discharge: Yes, if patient does not progress to where family can manage at home.  Patient Condition: This patient's condition remains as documented in the consult dated 05/24/16, in which the Rehabilitation Physician determined and documented that the patient's condition is appropriate for intensive rehabilitative care in an inpatient rehabilitation facility. Will admit to inpatient rehab today.  Preadmission Screen Completed By:  Paige Stewart, 05/25/2016 1:12 PM ______________________________________________________________________   Discussed status with Dr. Letta Pate on 05/25/16 at 1312 and received telephone approval for admission today.  Admission Coordinator:  Paige Stewart, time 1312/Date 05/25/16       Cosigned by: Charlett Blake, MD at 05/25/2016 1:31 PM  Revision History

## 2016-05-25 NOTE — Progress Notes (Signed)
Paige Colace, MD Physician Signed Physical Medicine and Rehabilitation  Consult Note Date of Service: 05/24/2016 9:56 AM  Related encounter: ED to Hosp-Admission (Current) from 05/21/2016 in [facility]     Expand All Collapse All   [] Hide copied text [] Hover for attribution information      Physical Medicine and Rehabilitation Consult   Reason for Consult: Right sided weakness, difficulty talking and difficulty swallowing Referring Physician: Dr. Jennette Stewart   HPI: Paige Stewart is a 66 y.o. RH-female with history of DM with neuropathy, HTN--medication noncompliance, who was found down by family on 05/21/16 with right sided weakness. EKG with ST changes and positive cardiac enzymes. 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  CTA chest/pelvis done due to concerns of aortic dissection and was negative for acute aortic syndrome with incidental finding of moderate stenosis proxima. R-RA.   2 D echo with EF 40-45% with severe hypokinesis of mid apical anteroseptal and anterior myocardium c/w with ischemia in distribution of LAD. Cardiology consulted for input and recommended outpatient work up for demand ischemia.  TEE negative for intracardiac embolic source. Dr. Pearlean Brownie recommended ASA for stroke due to unknown source. Therapy evaluations completed yesterday and patient limited by lethargy, right sided weakness, left gaze preference, expressive/receptive aphasia, dysphagia. CIR recommended for follow up therapy.    Patient (from [name] been here since November with plans to go back home in 3 weeks. Daughter works but has been off due to surgery. ter    Review of Systems  Unable to perform ROS: Language  Neurological: Positive for sensory change (has significant neuropathy BLE PTA), speech  change and focal weakness.          Past Medical History:  Diagnosis Date  . Diabetes mellitus without complication (HCC)   . Hypertension   . Neuropathy [facility])          Past Surgical History:  Procedure Laterality Date  . CESAREAN SECTION            Family History  Problem Relation Age of Onset  . Stroke Sister     two sisters with recent strokes      Social History: Currently staying with daughter.   Retired Runner, broadcasting/film/video. Was reasonably active but liimited by neuropathy per daughter. She  reports that she has never smoked. She has never used smokeless tobacco. She reports that she does not drink alcohol or use drugs.    Allergies: No Known Allergies          Medications Prior to Admission  Medication Sig Dispense Refill  . gabapentin (NEURONTIN) 300 MG capsule Take 300 mg by mouth 2 (two) times daily.    Marland Kitchen ibuprofen (ADVIL,MOTRIN) 200 MG tablet Take 200 mg by mouth every 6 (six) hours as needed for moderate pain.    Marland Kitchen lisinopril (PRINIVIL,ZESTRIL) 10 MG tablet Take 1 tablet (10 mg total) by mouth daily. 30 tablet 0  . metFORMIN (GLUCOPHAGE) 1000 MG tablet Take 1,000 mg by mouth 2 (two) times daily with a meal.    . metoprolol tartrate (LOPRESSOR) 25 MG tablet Take 25 mg by mouth 2 (two) times daily.    . Multiple Vitamin (MULTIVITAMIN) tablet Take 1 tablet by mouth daily.    . methocarbamol (ROBAXIN) 500 MG tablet Take 1 tablet (500 mg total) by mouth 2 (two) times daily. (Patient  not taking: Reported on 05/21/2016) 20 tablet 0    Home: Home Living Family/patient expects to be discharged to:: Unsure Living Arrangements: Children, Other relatives Available Help at Discharge: Family Type of Home: Apartment Home Access: Stairs to enter Entrance Stairs-Number of Steps: 45 (3rd floor) Additional Comments: Pt from [place], visiting daughter in [place]; daughter lives in 3rd floor apartment.   Functional History: Prior Function Level of  Independence: Independent Comments: Sister-in-law present and reports pt indep with all functional mobility and ADLs PTA.  Functional Status:  Mobility: Bed Mobility Overal bed mobility: Needs Assistance Bed Mobility: Supine to Sit, Sit to Supine Supine to sit: Max assist General bed mobility comments: Bed mobility with increased time and maxA +2 for trunk support and to maneuver BLE off bed; pt able to use LUE HHA to come into sitting. Able to initiate sit-to-supine, requiring maxA +2 to maneuver BLE and scoot in bed.  Transfers Overall transfer level: Needs assistance Equipment used: 1 person hand held assist (LUE HHA on back of chair) Transfers: Sit to/from Stand Sit to Stand: Max assist, +2 physical assistance General transfer comment: Stood to with RUE support on back of chair and maxA for trunk elevation and hip extension; pt with significant R lateral lean and pushing from LLE.   ADL:  Cognition: Cognition Overall Cognitive Status: Difficult to assess Arousal/Alertness: Awake/alert Orientation Level: Other (comment) (UTA due to aphasia ) Attention: Focused Focused Attention: Appears intact Cognition Arousal/Alertness: Lethargic Behavior During Therapy: Flat affect Overall Cognitive Status: Difficult to assess Area of Impairment: Orientation, Following commands, Awareness, Safety/judgement, Problem solving, Attention Current Attention Level: Sustained Following Commands: Follows one step commands with increased time, Follows one step commands inconsistently Safety/Judgement: Decreased awareness of deficits, Decreased awareness of safety Awareness: Intellectual Problem Solving: Slow processing, Decreased initiation, Requires verbal cues, Requires tactile cues General Comments: Sister-in-law reports pt is fluent in [place], although this is not her native language. Pt able to nod yes/no to questions in English <25% of time; appeared to repond better when asked questions by  sister-in-law in native language. Followed one step commands with increased time. Able to voice some answers with unintelligble words.  Difficult to assess due to: Impaired communication (Aphasia)  Blood pressure (!) 163/94, pulse 87, temperature 98.9 F (37.2 C), temperature source Oral, resp. rate 20, weight 66.5 kg (146 lb 8 oz), SpO2 98 %. Physical Exam  Nursing note and vitals reviewed. Constitutional: She appears well-developed and well-nourished.  HENT:  Head: Normocephalic and atraumatic.  Eyes: Conjunctivae are normal. Pupils are equal, round, and reactive to light.  Neck: Normal range of motion. Neck supple.  Cardiovascular: Normal rate and regular rhythm.   Respiratory: Effort normal and breath sounds normal. No stridor. No respiratory distress. She has no wheezes.  GI: Soft. Bowel sounds are normal. She exhibits no distension.  Musculoskeletal: She exhibits no edema.  Neurological: She is alert.  Alert. Anxious appearing with minimal verbal output. Soft voice with tendency to mumble. Right sided weakness with sensory deficits. Able to attend to right with cues. Needs occasional cues to follow simple commands.   Skin: Skin is warm and dry.  Psychiatric: Her mood appears anxious. Her speech is delayed. She is slowed and withdrawn. She exhibits a depressed mood.  Motor: 0/5 in the right deltoid, bicep, triceps, grip, hip flexor, knee extensor, ankle dorsiflexor Motor 5/5 in the left deltoid, bicep, tricep, grip, hip flexor, knee extensor, ankle dorsal flexor. Sensation absent to pinch in the right upper limb, slight  withdrawal in the right lower limb Deep tendon reflexes are absent in the right lower limb, 1 plus in the left lower limb  Lab Results Last 24 Hours       Results for orders placed or performed during the hospital encounter of 05/21/16 (from the past 24 hour(s))  Glucose, capillary     Status: Abnormal   Collection Time: 05/23/16 12:19 PM  Result Value Ref Range     Glucose-Capillary 169 (H) 65 - 99 mg/dL  Glucose, capillary     Status: Abnormal   Collection Time: 05/23/16  4:00 PM  Result Value Ref Range   Glucose-Capillary 197 (H) 65 - 99 mg/dL  Glucose, capillary     Status: Abnormal   Collection Time: 05/23/16  8:11 PM  Result Value Ref Range   Glucose-Capillary 177 (H) 65 - 99 mg/dL  Glucose, capillary     Status: Abnormal   Collection Time: 05/24/16  1:28 AM  Result Value Ref Range   Glucose-Capillary 215 (H) 65 - 99 mg/dL  CBC     Status: None   Collection Time: 05/24/16  5:12 AM  Result Value Ref Range   WBC 4.1 4.0 - 10.5 K/uL   RBC 4.12 3.87 - 5.11 MIL/uL   Hemoglobin 12.3 12.0 - 15.0 g/dL   HCT 16.1 09.6 - 04.5 %   MCV 87.4 78.0 - 100.0 fL   MCH 29.9 26.0 - 34.0 pg   MCHC 34.2 30.0 - 36.0 g/dL   RDW 40.9 81.1 - 91.4 %   Platelets 243 150 - 400 K/uL  Basic metabolic panel     Status: Abnormal   Collection Time: 05/24/16  5:12 AM  Result Value Ref Range   Sodium 137 135 - 145 mmol/L   Potassium 4.0 3.5 - 5.1 mmol/L   Chloride 109 101 - 111 mmol/L   CO2 22 22 - 32 mmol/L   Glucose, Bld 220 (H) 65 - 99 mg/dL   BUN 9 6 - 20 mg/dL   Creatinine, Ser 7.82 0.44 - 1.00 mg/dL   Calcium 8.6 (L) 8.9 - 10.3 mg/dL   GFR calc non Af Amer >60 >60 mL/min   GFR calc Af Amer >60 >60 mL/min   Anion gap 6 5 - 15  Glucose, capillary     Status: Abnormal   Collection Time: 05/24/16  7:29 AM  Result Value Ref Range   Glucose-Capillary 192 (H) 65 - 99 mg/dL      Imaging Results (Last 48 hours)  Dg Swallowing Func-speech Pathology  Result Date: 05/22/2016 Objective Swallowing Evaluation: Type of Study: MBS-Modified Barium Swallow Study Patient Details Name: Rickia Freeburg MRN: [id number] Date of Birth: 1950/11/12 Today's Date: 05/22/2016 Time: SLP Start Time (ACUTE ONLY): 1350-SLP Stop Time (ACUTE ONLY): 1415 SLP Time Calculation (min) (ACUTE ONLY): 25 min Past Medical History: Past Medical History: Diagnosis Date  . Diabetes mellitus without complication (HCC)  . Hypertension  . Neuropathy [facility])  Past Surgical History: Past Surgical History: Procedure Laterality Date . CESAREAN SECTION   HPI: Lillar Bianca a 66 y.o.femalepresenting with right sided weakness, right facial droop, dysarthria. PMH is significant for DM2 ,HTN, diabetic neuropathy. CT head without hemorrhage, CT perfusion with small area of penumbra in the anterior MCA per neurology. MRI 05/21/16 showed acute left basal ganglia infarct, moderate chronic small vessel ischemic disease. No prior swallowing evaluations in chart. Failed nursing stroke swallow screen. Subjective: Pt alert, not communicating verbally Assessment / Plan / Recommendation CHL IP CLINICAL IMPRESSIONS 05/22/2016 Clinical  Impression Pt presents with a moderate oral, mild pharyngeal dysphagia marked by reduced sensorimotor function - this leads to poor bolus cohesion and propulsion, anterior loss right side, right oral residue.  Swallow onset is delayed, particularly with thin liquids, which reach the pyriforms and then are aspirated during the swallow (accompanied by cough).  A chin tuck effectively prevented aspiration, but pt required significant cues to help her to carry-out chin tuck. There was good pharyngeal clearance of all POs.  Esophageal sweep revealed barium retention in the distal esophagus which remained throughout the study.  For now, recommend initiating a dysphagia 1 diet with nectar thick liquids.  Outside of meals, pt may have sips of thin liquid if she has 1:1 cueing and assist to tuck her chin.  Meds may be given whole in puree.  SLP will follow for safety/diet progression and pending speech language evaluation.  Pt's dtr present for exam and results/recs were reviewed.  SLP Visit Diagnosis Dysphagia, oropharyngeal phase (R13.12) Attention and concentration deficit following -- Frontal lobe and executive function deficit following -- Impact on safety and function Moderate  aspiration risk   CHL IP TREATMENT RECOMMENDATION 05/22/2016 Treatment Recommendations Therapy as outlined in treatment plan below   Prognosis 05/22/2016 Prognosis for Safe Diet Advancement Good Barriers to Reach Goals -- Barriers/Prognosis Comment -- CHL IP DIET RECOMMENDATION 05/22/2016 SLP Diet Recommendations Dysphagia 1 (Puree) solids;Nectar thick liquid Liquid Administration via Cup Medication Administration Whole meds with puree Compensations Minimize environmental distractions;Slow rate;Small sips/bites;Lingual sweep for clearance of pocketing Postural Changes Remain semi-upright after after feeds/meals (Comment)   CHL IP OTHER RECOMMENDATIONS 05/22/2016 Recommended Consults -- Oral Care Recommendations Oral care BID Other Recommendations Remove water pitcher;Have oral suction available   CHL IP FOLLOW UP RECOMMENDATIONS 05/22/2016 Follow up Recommendations (No Data)   CHL IP FREQUENCY AND DURATION 05/22/2016 Speech Therapy Frequency (ACUTE ONLY) min 3x week Treatment Duration 2 weeks      CHL IP ORAL PHASE 05/22/2016 Oral Phase Impaired Oral - Pudding Teaspoon -- Oral - Pudding Cup -- Oral - Honey Teaspoon -- Oral - Honey Cup -- Oral - Nectar Teaspoon -- Oral - Nectar Cup Right anterior bolus loss;Weak lingual manipulation;Incomplete tongue to palate contact;Reduced posterior propulsion;Delayed oral transit Oral - Nectar Straw -- Oral - Thin Teaspoon -- Oral - Thin Cup Right anterior bolus loss;Weak lingual manipulation;Incomplete tongue to palate contact;Reduced posterior propulsion;Delayed oral transit Oral - Thin Straw -- Oral - Puree Weak lingual manipulation;Incomplete tongue to palate contact;Reduced posterior propulsion;Right anterior bolus loss;Delayed oral transit Oral - Mech Soft Right anterior bolus loss;Weak lingual manipulation;Incomplete tongue to palate contact;Reduced posterior propulsion;Delayed oral transit Oral - Regular -- Oral - Multi-Consistency -- Oral - Pill -- Oral Phase - Comment --  CHL IP  PHARYNGEAL PHASE 05/22/2016 Pharyngeal Phase Impaired Pharyngeal- Pudding Teaspoon -- Pharyngeal -- Pharyngeal- Pudding Cup -- Pharyngeal -- Pharyngeal- Honey Teaspoon -- Pharyngeal -- Pharyngeal- Honey Cup -- Pharyngeal -- Pharyngeal- Nectar Teaspoon -- Pharyngeal -- Pharyngeal- Nectar Cup Delayed swallow initiation-vallecula Pharyngeal -- Pharyngeal- Nectar Straw -- Pharyngeal -- Pharyngeal- Thin Teaspoon -- Pharyngeal -- Pharyngeal- Thin Cup Delayed swallow initiation-pyriform sinuses;Reduced airway/laryngeal closure;Penetration/Aspiration during swallow;Trace aspiration Pharyngeal Material enters airway, passes BELOW cords and not ejected out despite cough attempt by patient Pharyngeal- Thin Straw -- Pharyngeal -- Pharyngeal- Puree Delayed swallow initiation-vallecula Pharyngeal -- Pharyngeal- Mechanical Soft Delayed swallow initiation-vallecula Pharyngeal -- Pharyngeal- Regular -- Pharyngeal -- Pharyngeal- Multi-consistency -- Pharyngeal -- Pharyngeal- Pill -- Pharyngeal -- Pharyngeal Comment --  CHL IP CERVICAL ESOPHAGEAL PHASE  05/22/2016 Cervical Esophageal Phase (No Data) Pudding Teaspoon -- Pudding Cup -- Honey Teaspoon -- Honey Cup -- Nectar Teaspoon -- Nectar Cup -- Nectar Straw -- Thin Teaspoon -- Thin Cup -- Thin Straw -- Puree -- Mechanical Soft -- Regular -- Multi-consistency -- Pill -- Cervical Esophageal Comment -- No flowsheet data found. 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 Does the need for close, 24 hr/day medical supervision in concert with the patient's rehab needs make it unreasonable for this patient to be served in a less intensive setting? Yes 2. Co-Morbidities requiring supervision/potential complications: Diabetes, hypertension, cardiomyopathy 3. Due to bladder management, bowel management, safety, skin/wound care, disease management, medication administration, pain  management and patient education, does the patient require 24 hr/day rehab nursing? Yes 4. Does the patient require coordinated care of a physician, rehab nurse, PT (1-2 hrs/day, 5 days/week), OT (1-2 hrs/day, 5 days/week) and SLP (.5-1 hrs/day, 5 days/week) to address physical and functional deficits in the context of the above medical diagnosis(es)? Yes Addressing deficits in the following areas: balance, endurance, locomotion, strength, transferring, bowel/bladder control, bathing, dressing, feeding, grooming, toileting, cognition, speech, language, swallowing and psychosocial support 5. Can the patient actively participate in an intensive therapy program of at least 3 hrs of therapy per day at least 5 days per week? Yes 6. The potential for patient to make measurable gains while on inpatient rehab is good 7. Anticipated functional outcomes upon discharge from inpatient rehab are min assist  with PT, min assist with OT, modified independent with SLP. 8. Estimated rehab length of stay to reach the above functional goals is: 19-22d 9. Does the patient have adequate social supports and living environment to accommodate these discharge functional goals? Yes 10. Anticipated D/C setting: Home 11. Anticipated post D/C treatments: HH therapy 12. Overall Rehab/Functional Prognosis: good  RECOMMENDATIONS: This patient's condition is appropriate for continued rehabilitative care in the following setting: CIR Patient has agreed to participate in recommended program. Yes Note that insurance prior authorization may be required for reimbursement for recommended care.  Comment: Will not be ready to fly back to [place] in 3 weeks  Paige Stewart M.D. [facility] FAAPM&R (Sports Med, Neuromuscular Med) Diplomate Am Board of Electrodiagnostic Med  Jerene Pitch 05/24/2016    Revision History                        Routing History

## 2016-05-25 NOTE — Progress Notes (Signed)
Nurse report called to [place], Charity fundraiser.  Stroke education provided to pt and pt's daughter with understanding verbalized.  Pt denies any pain or discomfort prior to transfer.  Pt transferred by nursing staff via bed to 4W18 in stable condition with daughter present.

## 2016-05-25 NOTE — Progress Notes (Signed)
Transitions of Care Pharmacy Note  Plan:  Educated on new stroke, cholesterol, and blood pressure medications. Also educated on new insulin start Addressed concerns regarding obtaining medications for longer durations greater than 3 months at a time --------------------------------------------- Paige Stewart is an 66 y.o. female who presents with a chief complaint of stroke. In anticipation of discharge, pharmacy has reviewed this patient's prior to admission medication history, as well as current inpatient medications listed per the [facility].  Current medication indications, dosing, frequency, and notable side effects reviewed with patient and family. patient and family verbalized understanding of current inpatient medication regimen and is aware that the After Visit Summary when presented, will represent the most accurate medication list at discharge.   Paige Stewart expressed concerns regarding ability to obtain longer durations of medications upon discharge from rehab. I informed her that since these new medications are not controlled substances, if she is willing to pay for it out of pocket, a pharmacy is allowed to fill for the entire quantity of a prescription (including all refills) at a single fill. We also discussed side effects of all her new medications and what the signs of hypotension and hypoglycemia are.    Assessment: Understanding of regimen: fair Understanding of indications: fair Potential of compliance: fair Barriers to Obtaining Medications: No  Patient instructed to contact inpatient pharmacy team with further questions or concerns if needed.    Time spent preparing for discharge counseling: 15 Time spent counseling patient: 69   Thank you for allowing pharmacy to be a part of this patient's care.  Gwyndolyn Kaufman Bernette Redbird), PharmD  PGY1 Pharmacy Resident Pager: [phone] 05/25/2016 5:24 PM

## 2016-05-25 NOTE — Discharge Instructions (Signed)
You were hospitalized after having a stroke. You were started on a number of new medications this hospital stay.  Please review your paperwork carefully and continue these medications after discharge. You were also found to have a urinary tract infection infection and a kidney infection. Your urine cultures were collected and you were started on antibiotics to treat this infection. Please complete the full course of your antibiotics (through 05/31/2016) to treat this infection.  The neurology team would like you to follow up in their office in 6 weeks. Please call and make this appointment.  Our clinic's number is [phone]. Please call with questions or concerns about what we discussed today.

## 2016-05-25 NOTE — Care Management Note (Signed)
Case Management Note  Patient Details  Name: Paige Stewart MRN: [id number] Date of Birth: April 03, 1950  Subjective/Objective:       Stroke             Action/Plan: Discharge Planning: Lives at home with dtr, [facility] and son-in-law. Spoke to pt and dtr at bedside. Pt scheduled dc to CIR for rehab. Contacted CIR Coordinator to follow up.     Expected Discharge Date:                Expected Discharge Plan:  IP Rehab Facility  In-House Referral:    Discharge planning Services  CM Consult  Post Acute Care Choice:  NA Choice offered to:  NA  DME Arranged:  N/A DME Agency:  NA  HH Arranged:  NA HH Agency:  NA  Status of Service:  Completed, signed off  If discussed at Long Length of Stay Meetings, dates discussed:    Additional Comments:  Elliot Cousin, RN 05/25/2016, 10:24 AM

## 2016-05-25 NOTE — Progress Notes (Signed)
Pharmacy Antibiotic Note  Paige Stewart is a 66 y.o. female with UTI vs pyelo. Urine culture is growing > 100K of ecoli, patient was initially on Keflex then switched to ciprofloxacin. This e.coli infection is resistant to both of those antibiotics. Patient will now be transitioned over to Bactrim. Will aim for longer course of abx due to pyelo.  Plan: Bactrim DS 1 tab bid x 7 days   Weight: 147 lb 12.8 oz (67 kg)  Temp (24hrs), Avg:98.5 F (36.9 C), Min:97.9 F (36.6 C), Max:99 F (37.2 C)   Recent Labs Lab 05/21/16 0953 05/21/16 1006 05/22/16 0006 05/23/16 0511 05/24/16 0512 05/25/16 0404  WBC 5.1  --  4.7 6.3 4.1 8.8  CREATININE 0.76 0.70 0.71 0.64 0.69 0.64       Agapito Games, PharmD, BCPS Clinical Pharmacist 05/25/2016 9:57 AM

## 2016-05-25 NOTE — PMR Pre-admission (Signed)
PMR Admission Coordinator Pre-Admission Assessment  Patient: Paige Stewart is an 66 y.o., female MRN: [id number] DOB: 22-Feb-1950 Height:   Weight: 67 kg (147 lb 12.8 oz)             Insurance Information HMO:    PPO:       PCP:       IPA:       80/20:       OTHER:   PRIMARY:  PHCS      Policy#: [id number]      Subscriber:  Paige Stewart CM Name:        Phone#:       Fax#:   Pre-Cert#:        Employer: Retired Benefits:  Phone #: [phone]     Name:  Paige Stewart. Date:  05/03/16     Deduct:  $0      Out of Pocket Max:  $500 (met $0)      Life Max:  $110,000 CIR:  100% in network/80% out of network      SNF:  100% in network/80% out of network Outpatient: 15 visits PT     Co-Pay:  $60/visit Home Health: 100% in/ 80% out      Co-Pay: 20% out of network DME: 80% (100% in network) for std. w/c or hospital bed only     Co-Pay: 20% Providers:  In network covers 100% and out of network covers 46%  Medicaid Application Date:        Case Manager:   Disability Application Date:        Case Worker:    Emergency Contact Information Contact Information    Name Relation Home Work Paige Stewart Daughter   [phone]   Phiri,Robert "Mikki Santee" Relative   [phone]     Current Medical History  Patient Admitting Diagnosis:   History of Present Illness: A 66 y.o. RH-female with history of DM with neuropathy, HTN--medication noncompliance, who was found down by family on 05/21/16 with right sided weakness. EKG with ST changes and positive cardiac enzymes. 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  CTA chest/pelvis done due to concerns of aortic dissection and was negative for acute aortic syndrome with incidental finding of moderate stenosis proxima. R-RA.   2 D echo with EF 40-45% with severe hypokinesis of mid apical  anteroseptal and anterior myocardium c/w with ischemia in distribution of LAD. Cardiology consulted for input and recommended outpatient work up for demand ischemia.  TEE negative for intracardiac embolic source. Dr. Leonie Man recommended ASA for stroke due to unknown source. Therapy evaluations completed yesterday and patient limited by lethargy, right sided weakness, left gaze preference, expressive/receptive aphasia, dysphagia. CIR recommended for follow up therapy.   Patient (from [place] been here since November with plans to go back home in 3 weeks. Daughter works but has been off due to surgery.   Total: 20=NIH  Past Medical History  Past Medical History:  Diagnosis Date  . Diabetes mellitus without complication ([place])   . Hypertension   . Neuropathy ([place])     Family History  family history includes Stroke in her sister.  Prior Rehab/Hospitalizations: No previous rehab  Has the patient had major surgery during 100 days prior to admission? No  Current Medications   Current Facility-Administered Medications:  .  acetaminophen (TYLENOL) tablet 650 mg, 650 mg, Oral,  Q6H PRN, Everrett Coombe, MD, 650 mg at 05/25/16 0908 .  aspirin chewable tablet 81 mg, 81 mg, Oral, Daily, Dickie La, MD, 81 mg at 05/25/16 0909 .  atorvastatin (LIPITOR) tablet 40 mg, 40 mg, Oral, q1800, Donzetta Starch, NP, 40 mg at 05/24/16 1721 .  clopidogrel (PLAVIX) tablet 75 mg, 75 mg, Oral, Daily, Everrett Coombe, MD, 75 mg at 05/25/16 0908 .  food thickener (THICK IT) powder, , Oral, PRN, Dickie La, MD .  gabapentin (NEURONTIN) capsule 300 mg, 300 mg, Oral, BID, Everrett Coombe, MD, 300 mg at 05/25/16 0910 .  hydrALAZINE (APRESOLINE) injection 5 mg, 5 mg, Intravenous, Q6H PRN, Smiley Houseman, MD .  hydrochlorothiazide (HYDRODIURIL) tablet 25 mg, 25 mg, Oral, Daily, Everrett Coombe, MD, 25 mg at 05/25/16 0909 .  insulin aspart (novoLOG) injection 0-9 Units, 0-9 Units, Subcutaneous, TID WC, Everrett Coombe, MD, 3 Units at  05/25/16 0910 .  insulin NPH Human (HUMULIN N,NOVOLIN N) injection 5 Units, 5 Units, Subcutaneous, BID AC & HS, Everrett Coombe, MD, 5 Units at 05/24/16 2341 .  lisinopril (PRINIVIL,ZESTRIL) tablet 20 mg, 20 mg, Oral, Daily, Everrett Coombe, MD, 20 mg at 05/25/16 0908 .  metoprolol succinate (TOPROL-XL) 24 hr tablet 50 mg, 50 mg, Oral, Daily, Everrett Coombe, MD, 50 mg at 05/25/16 0908 .  senna-docusate (Senokot-S) tablet 1 tablet, 1 tablet, Oral, QHS PRN, Smiley Houseman, MD, 1 tablet at 05/24/16 0815 .  simethicone (MYLICON) chewable tablet 80 mg, 80 mg, Oral, QID PRN, Bufford Lope, DO, 80 mg at 05/24/16 2251 .  sulfamethoxazole-trimethoprim (BACTRIM DS,SEPTRA DS) 800-160 MG per tablet 1 tablet, 1 tablet, Oral, Q12H, Jake Church Masters, RPH  Patients Current Diet: DIET - DYS 1 Room service appropriate? Yes; Fluid consistency: Nectar Thick Diet - low sodium heart healthy  Precautions / Restrictions Precautions Precautions: Fall Restrictions Weight Bearing Restrictions: No   Has the patient had 2 or more falls or a fall with injury in the past year?No  Prior Activity Level Community (5-7x/wk): Went out daily, walked a lot, not driving.  Home Assistive Devices / Equipment Home Assistive Devices/Equipment: Eyeglasses  Prior Device Use: Indicate devices/aids used by the patient prior to current illness, exacerbation or injury? None  Prior Functional Level Prior Function Level of Independence: Independent Comments: Per daughter, pt independent with ADL PTA.  Self Care: Did the patient need help bathing, dressing, using the toilet or eating?  Independent  Indoor Mobility: Did the patient need assistance with walking from room to room (with or without device)? Independent  Stairs: Did the patient need assistance with internal or external stairs (with or without device)? Independent  Functional Cognition: Did the patient need help planning regular tasks such as shopping or remembering to take  medications? Independent  Current Functional Level Cognition  Arousal/Alertness: Awake/alert Overall Cognitive Status: Difficult to assess Difficult to assess due to: Impaired communication Current Attention Level: Sustained Orientation Level: Other (comment) (UTA) Following Commands: Follows one step commands inconsistently, Follows one step commands with increased time Safety/Judgement: Decreased awareness of deficits General Comments: Pt inconsistently responding to questions and following commands. Pt able to verbalize her full name. Nodding yes/no to questions at start of session. Attention: Focused Focused Attention: Appears intact    Extremity Assessment (includes Sensation/Coordination)  Upper Extremity Assessment: RUE deficits/detail RUE Deficits / Details: Trace scapular elevation noted, otherwise no active movement noted. Full PROM. Does not withdraw to noxious stimuli. RUE Sensation: decreased light touch RUE Coordination: decreased fine motor,  decreased gross motor  Lower Extremity Assessment: Defer to PT evaluation RLE Deficits / Details: RLE hemiplegia- able to stand without R knee buckling, although not bearing full weight on RLE.  RLE Sensation: decreased light touch    ADLs  Overall ADL's : Needs assistance/impaired Eating/Feeding: Minimal assistance, Sitting Eating/Feeding Details (indicate cue type and reason): Pt able to take sips of water using L hand during grooming activity. Grooming: Moderate assistance, Sitting, Wash/dry face, Oral care, Cueing for sequencing Grooming Details (indicate cue type and reason): Consistent cues for sequencing Upper Body Bathing: Moderate assistance, Sitting Lower Body Bathing: Maximal assistance Upper Body Dressing : Moderate assistance, Sitting Lower Body Dressing: Maximal assistance Toilet Transfer Details (indicate cue type and reason): Mod +2 for sit to stand in [place]; total assist for transfer. Functional mobility during  ADLs: Moderate assistance, +2 for physical assistance (for sit to stand in [place])    Mobility  Overal bed mobility: Needs Assistance Bed Mobility: Rolling, Sidelying to Sit Rolling: Mod assist Sidelying to sit: Max assist, +2 for physical assistance Supine to sit: Max assist General bed mobility comments: Cues for hand placement on bed rail for rolling. Assist for LEs to EOB and for trunk elevation to sit. Assist to scoot hips out to EOB with use of bed pad, 2nd person steadying assist due to poor sitting balance.    Transfers  Overall transfer level: Needs assistance Equipment used: Ambulation equipment used (stedy) Transfer via Lift Equipment: Stedy Transfers: Sit to/from Stand, Risk manager Sit to Stand: Mod assist, +2 physical assistance Stand pivot transfers: Total assist, +2 physical assistance General transfer comment: Mod assist +2 for initial sit to stand from EOB with use of Stedy; assist for R UE and to boost up. From [facility] pt able to stand with mod assist +1 for RUE support. Total assist for pivot transfer to chair in [place].    Ambulation / Gait / Stairs / Office manager / Balance Dynamic Sitting Balance Sitting balance - Comments: Max-min assist for sitting balance. Significant R lateral lean. Balance Overall balance assessment: Needs assistance Sitting-balance support: Single extremity supported, Feet supported Sitting balance-Leahy Scale: Poor Sitting balance - Comments: Max-min assist for sitting balance. Significant R lateral lean. Postural control: Right lateral lean Standing balance support: Bilateral upper extremity supported Standing balance-Leahy Scale: Zero Standing balance comment: in Stedy    Special needs/care consideration BiPAP/CPAP No CPM No Continuous Drip IV No Dialysis No        Life Vest No Oxygen No Special Bed No Trach Size No Wound Vac (area) No     Skin No                             Bowel mgmt: Last BM  05/24/16 with incontinence Bladder mgmt: Incontinent Diabetic mgmt Yes, on oral medications Contact isolation:  Yes    Previous Home Environment Living Arrangements: Children Available Help at Discharge: Family Type of Home: Apartment Home Access: Stairs to enter Technical brewer of Steps: 3 flights Home Care Services: No Additional Comments: Pt from [place], visiting daughter in [place]; daughter lives in 3rd floor apartment.   Discharge Living Setting Plans for Discharge Living Setting: Lives with (comment), Apartment (Plans home with dtr and son in law.) Type of Home at Discharge: Apartment (3rd level apartment.) Discharge Home Layout: One level (On the 3rd level.) Discharge Home Access: Stairs to enter Entrance Stairs-Number of Steps:  32 steps to 3rd level apartment.  Likely will need ambulance at D/ (Likely will need ambulance at DC to get in apartment.) Does the patient have any problems obtaining your medications?: No  Social/Family/Support Systems Patient Roles: Parent, Other (Comment) (Has a daughter and a son-in-law.) Contact Information: [place] - daughter - [phone] Anticipated Caregiver: Daughter, son in law and others Anticipated Caregiver's Contact Information: Jed Limerick - son in law [phone] Ability/Limitations of Caregiver: Dtr and son in Sports coach both work.  They are trying to arrance for caregivers while they work. Caregiver Availability: Other (Comment) (Family aware of need for supervision and light care at D/C) Discharge Plan Discussed with Primary Caregiver: Yes Is Caregiver In Agreement with Plan?: Yes Does Caregiver/Family have Issues with Lodging/Transportation while Pt is in Rehab?: No  Goals/Additional Needs Patient/Family Goal for Rehab: PT/OT min assist, SLP mod I goals Expected length of stay: 19-22 days Cultural Considerations: From [place], [place], is catholic, speaks [place].  Privacy is very important.  Ask first if okay for female to  assist.  Be sure patient is covered up. Dietary Needs: Dys1, nectar thick liquids Equipment Needs: TBD Additional Information: Per Dr. Letta Pate, will not be ready to return to [place] in 3 weeks. Has been in the [place] since November, 2017 with daughter.  Pt/Family Agrees to Admission and willing to participate: Yes Program Orientation Provided & Reviewed with Pt/Caregiver Including Roles  & Responsibilities: Yes  Decrease burden of Care through IP rehab admission: N/A  Possible need for SNF placement upon discharge: Yes, if patient does not progress to where family can manage at home.  Patient Condition: This patient's condition remains as documented in the consult dated 05/24/16, in which the Rehabilitation Physician determined and documented that the patient's condition is appropriate for intensive rehabilitative care in an inpatient rehabilitation facility. Will admit to inpatient rehab today.  Preadmission Screen Completed By:  [name], 05/25/2016 1:12 PM ______________________________________________________________________   Discussed status with Dr. Letta Pate on 05/25/16 at 1312 and received telephone approval for admission today.  Admission Coordinator:  [name], time 1312/Date 05/25/16

## 2016-05-25 NOTE — H&P (Signed)
Physical Medicine and Rehabilitation Admission H&P    Chief Complaint  Patient presents with  . Right sided weakness, right inattention, expressive/receptive deficits.    HPI:  Paige Stewart is a 66 y.o. RH-female with history of DM with neuropathy, HTN--medication noncompliance, who was found down by family on 05/21/16 with right sided weakness. EKG with ST changes and positive cardiac enzymes. 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  CTA chest/pelvis done due to concerns of aortic dissection and was negative for acute aortic syndrome with incidental finding of moderate stenosis proxima. R-RA.   2 D echo with EF 40-45% with severe hypokinesis of mid apical anteroseptal and anterior myocardium c/w with ischemia in distribution of LAD. Cardiology consulted for input and recommended outpatient work up for demand ischemia.  TEE negative for intracardiac embolic source and moderate non-mobile atheroma.  Dr. Leonie Man felt OK to change to Plavix for stroke due to unknown source.  To consider outpatient NUC in a few months if still in [place]. She was found to have ESBL E coli in urine and antibiotic changed to bactrim today.   Therapy initiated and patient limited by lethargy, right sided weakness, left gaze preference, expressive/receptive aphasia, dysphagia. CIR recommended for follow up therapy.    Review of Systems  Constitutional: Positive for malaise/fatigue.  HENT: Negative for hearing loss and tinnitus.   Eyes: Negative for blurred vision.  Respiratory: Negative for cough and shortness of breath.   Cardiovascular: Negative for chest pain, claudication and leg swelling.  Gastrointestinal: Negative for abdominal pain and constipation.  Genitourinary: Negative for dysuria and urgency.  Musculoskeletal: Positive for back pain and  myalgias.  Neurological: Positive for sensory change (has severe pain BLE at times due to neuropathy), speech change, focal weakness and weakness. Negative for dizziness and headaches.  Psychiatric/Behavioral: The patient is not nervous/anxious and does not have insomnia.       Past Medical History:  Diagnosis Date  . Diabetes mellitus without complication ([place])   . Hypertension   . Neuropathy [facility])     Past Surgical History:  Procedure Laterality Date  . CESAREAN SECTION    . TEE WITHOUT CARDIOVERSION N/A 05/23/2016   Procedure: TRANSESOPHAGEAL ECHOCARDIOGRAM (TEE);  Surgeon: Dorothy Spark, MD;  Location: [facility] ENDOSCOPY;  Service: Cardiovascular;  Laterality: N/A;    Family History  Problem Relation Age of Onset  . Stroke Sister     two sisters with recent strokes    Social History:  Currently staying with daughter.   Retired Pharmacist, hospital. Was reasonably active but liimited by neuropathy per daughter. She  reports that she has never smoked. She has never used smokeless tobacco. She reports that she does not drink alcohol or use drugs.    Allergies: No Known Allergies    Medications Prior to Admission  Medication Sig Dispense Refill  . [START ON 05/26/2016] aspirin 81 MG chewable tablet Chew 1 tablet (81 mg total) by mouth daily. 90 tablet 0  . atorvastatin (LIPITOR) 40 MG tablet Take 1 tablet (40 mg total) by mouth daily at 6 PM. 90 tablet 0  . [START ON 05/26/2016] clopidogrel (PLAVIX) 75 MG tablet Take 1 tablet (75 mg total) by mouth daily. 90 tablet 0  . food thickener (THICK IT) POWD Take 1 g by mouth as needed (to thicken liquids).  0  . gabapentin (NEURONTIN) 300  MG capsule Take 300 mg by mouth 2 (two) times daily.    Derrill Memo ON 05/26/2016] hydrochlorothiazide (HYDRODIURIL) 25 MG tablet Take 1 tablet (25 mg total) by mouth daily. 30 tablet 0  . ibuprofen (ADVIL,MOTRIN) 200 MG tablet Take 200 mg by mouth every 6 (six) hours as needed for moderate pain.    Marland Kitchen insulin NPH Human  (HUMULIN N,NOVOLIN N) 100 UNIT/ML injection Inject 0.05 mLs (5 Units total) into the skin 2 (two) times daily at 8 am and 10 pm. 10 mL 11  . [START ON 05/26/2016] lisinopril (PRINIVIL,ZESTRIL) 20 MG tablet Take 1 tablet (20 mg total) by mouth daily. 30 tablet 0  . metFORMIN (GLUCOPHAGE) 1000 MG tablet Take 1,000 mg by mouth 2 (two) times daily with a meal.    . [START ON 05/26/2016] metoprolol succinate (TOPROL-XL) 50 MG 24 hr tablet Take 1 tablet (50 mg total) by mouth daily. Take with or immediately following a meal. 90 tablet 0  . Multiple Vitamin (MULTIVITAMIN) tablet Take 1 tablet by mouth daily.    Marland Kitchen sulfamethoxazole-trimethoprim (BACTRIM DS,SEPTRA DS) 800-160 MG tablet Take 1 tablet by mouth every 12 (twelve) hours. 14 tablet 0    Home: Home Living Family/patient expects to be discharged to:: Private residence Living Arrangements: Children   Functional History:    Functional Status:  Mobility:          ADL:    Cognition: Cognition Orientation Level: Other (comment) (difficult to assess r/t aphasia)    Physical Exam: Blood pressure 117/66, pulse 88, temperature 98.6 F (37 C), temperature source Oral, resp. rate 17, height 4' 11"  (1.499 m), weight 67.8 kg (149 lb 7.6 oz), SpO2 99 %. Physical Exam  Nursing note and vitals reviewed. Constitutional: She is oriented to person, place, and time. She appears well-developed and well-nourished.  HENT:  Head: Normocephalic and atraumatic.  Mouth/Throat: Oropharynx is clear and moist.  Eyes: Conjunctivae are normal. Pupils are equal, round, and reactive to light.  Neck: Neck supple.  Cardiovascular: Normal rate and regular rhythm.   Respiratory: Effort normal and breath sounds normal. No stridor. No respiratory distress. She has no wheezes.  GI: Soft. Bowel sounds are normal. She exhibits no distension. There is no tenderness.  Musculoskeletal: She exhibits no edema or tenderness.  Neurological: She is alert and oriented to  person, place, and time. A cranial nerve deficit is present.  Expressive deficits with decreased volume and tendency to mumble. Moderate dysarthria and delayed responses. Right facial weakness with right inattention. Unable to recall DOB or city. Had difficulty with two step commands.  Right hemiparesis with sensory deficits.   Skin: Skin is warm and dry.  Psychiatric: Her mood appears anxious. Her speech is delayed. She is slowed. Cognition and memory are impaired.  0/5 in RUE and RLE 5/5 in LUE and LLE Sensation absent to pinch in RUE, present in RLE Naming of simple objects intact  Results for orders placed or performed during the hospital encounter of 05/21/16 (from the past 48 hour(s))  Glucose, capillary     Status: Abnormal   Collection Time: 05/23/16  8:11 PM  Result Value Ref Range   Glucose-Capillary 177 (H) 65 - 99 mg/dL  Glucose, capillary     Status: Abnormal   Collection Time: 05/24/16  1:28 AM  Result Value Ref Range   Glucose-Capillary 215 (H) 65 - 99 mg/dL  CBC     Status: None   Collection Time: 05/24/16  5:12 AM  Result Value Ref  Range   WBC 4.1 4.0 - 10.5 K/uL   RBC 4.12 3.87 - 5.11 MIL/uL   Hemoglobin 12.3 12.0 - 15.0 g/dL   HCT 36.0 36.0 - 46.0 %   MCV 87.4 78.0 - 100.0 fL   MCH 29.9 26.0 - 34.0 pg   MCHC 34.2 30.0 - 36.0 g/dL   RDW 13.2 11.5 - 15.5 %   Platelets 243 150 - 400 K/uL  Basic metabolic panel     Status: Abnormal   Collection Time: 05/24/16  5:12 AM  Result Value Ref Range   Sodium 137 135 - 145 mmol/L   Potassium 4.0 3.5 - 5.1 mmol/L    Comment: DELTA CHECK NOTED   Chloride 109 101 - 111 mmol/L   CO2 22 22 - 32 mmol/L   Glucose, Bld 220 (H) 65 - 99 mg/dL   BUN 9 6 - 20 mg/dL   Creatinine, Ser 0.69 0.44 - 1.00 mg/dL   Calcium 8.6 (L) 8.9 - 10.3 mg/dL   GFR calc non Af Amer >60 >60 mL/min   GFR calc Af Amer >60 >60 mL/min    Comment: (NOTE) The eGFR has been calculated using the CKD EPI equation. This calculation has not been validated in  all clinical situations. eGFR's persistently <60 mL/min signify possible Chronic Kidney Disease.    Anion gap 6 5 - 15  Glucose, capillary     Status: Abnormal   Collection Time: 05/24/16  7:29 AM  Result Value Ref Range   Glucose-Capillary 192 (H) 65 - 99 mg/dL  Glucose, capillary     Status: Abnormal   Collection Time: 05/24/16 11:40 AM  Result Value Ref Range   Glucose-Capillary 226 (H) 65 - 99 mg/dL  Glucose, capillary     Status: Abnormal   Collection Time: 05/24/16  4:20 PM  Result Value Ref Range   Glucose-Capillary 197 (H) 65 - 99 mg/dL   Comment 1 Notify RN    Comment 2 Document in Chart   Glucose, capillary     Status: Abnormal   Collection Time: 05/24/16  8:47 PM  Result Value Ref Range   Glucose-Capillary 261 (H) 65 - 99 mg/dL  Glucose, capillary     Status: Abnormal   Collection Time: 05/25/16 12:42 AM  Result Value Ref Range   Glucose-Capillary 242 (H) 65 - 99 mg/dL  CBC     Status: None   Collection Time: 05/25/16  4:04 AM  Result Value Ref Range   WBC 8.8 4.0 - 10.5 K/uL   RBC 4.23 3.87 - 5.11 MIL/uL   Hemoglobin 12.5 12.0 - 15.0 g/dL   HCT 36.5 36.0 - 46.0 %   MCV 86.3 78.0 - 100.0 fL   MCH 29.6 26.0 - 34.0 pg   MCHC 34.2 30.0 - 36.0 g/dL   RDW 13.2 11.5 - 15.5 %   Platelets 254 150 - 400 K/uL  Basic metabolic panel     Status: Abnormal   Collection Time: 05/25/16  4:04 AM  Result Value Ref Range   Sodium 136 135 - 145 mmol/L   Potassium 3.4 (L) 3.5 - 5.1 mmol/L   Chloride 104 101 - 111 mmol/L   CO2 24 22 - 32 mmol/L   Glucose, Bld 242 (H) 65 - 99 mg/dL   BUN 7 6 - 20 mg/dL   Creatinine, Ser 0.64 0.44 - 1.00 mg/dL   Calcium 8.9 8.9 - 10.3 mg/dL   GFR calc non Af Amer >60 >60 mL/min   GFR calc  Af Amer >60 >60 mL/min    Comment: (NOTE) The eGFR has been calculated using the CKD EPI equation. This calculation has not been validated in all clinical situations. eGFR's persistently <60 mL/min signify possible Chronic Kidney Disease.    Anion gap 8  5 - 15  Glucose, capillary     Status: Abnormal   Collection Time: 05/25/16  4:51 AM  Result Value Ref Range   Glucose-Capillary 223 (H) 65 - 99 mg/dL  Glucose, capillary     Status: Abnormal   Collection Time: 05/25/16  7:17 AM  Result Value Ref Range   Glucose-Capillary 218 (H) 65 - 99 mg/dL  Glucose, capillary     Status: Abnormal   Collection Time: 05/25/16 12:01 PM  Result Value Ref Range   Glucose-Capillary 293 (H) 65 - 99 mg/dL  Glucose, capillary     Status: Abnormal   Collection Time: 05/25/16  5:28 PM  Result Value Ref Range   Glucose-Capillary 231 (H) 65 - 99 mg/dL   No results found.     Medical Problem List and Plan: 1.  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  DVT Prophylaxis/Anticoagulation: Pharmaceutical: Lovenox 3. Pain Management: will add Sportscreme for back pain.  4. Mood: LCSW to follow for evaluation and support.  5. Neuropsych: This patient is not capable of making decisions on her own behalf. 6. Skin/Wound Care: pressure relief measures. 7. Fluids/Electrolytes/Nutrition: Monitor I/O. Check lytes in am. 8. HTN: monitor BP bid--continue HCTZ.  9. Hypokalemia: will add supplement. 10. ESBL E coli UTI: Changed to septra DS D# 1 11. NSTEMI: Will need follow up NUC study in the future.  12. T2DM with peripheral neuropathy: Monitor BS ac/hs. Hgb A1c- 10.3-poorly controlled. Continue metformin and add Amaryl. Question ability to manage insulin especially is she has to go back to [place].      Post Admission Physician Evaluation: 1. Functional deficits secondary  to Left MCA infarct. 2. Patient is admitted to receive collaborative, interdisciplinary care between the physiatrist, rehab nursing staff, and therapy team. 3. Patient's level of medical complexity and substantial therapy needs in context of that medical necessity cannot be provided at a lesser intensity of care such as a SNF. 4. Patient has experienced substantial functional loss from  his/her baseline which was documented above under the "Functional History" and "Functional Status" headings.  Judging by the patient's diagnosis, physical exam, and functional history, the patient has potential for functional progress which will result in measurable gains while on inpatient rehab.  These gains will be of substantial and practical use upon discharge  in facilitating mobility and self-care at the household level. 5. Physiatrist will provide 24 hour management of medical needs as well as oversight of the therapy plan/treatment and provide guidance as appropriate regarding the interaction of the two. 6. The Preadmission Screening has been reviewed and patient status is unchanged unless otherwise stated above. 7. 24 hour rehab nursing will assist with bladder management, bowel management, safety, skin/wound care, disease management, medication administration, pain management and patient education  and help integrate therapy concepts, techniques,education, etc. 8. PT will assess and treat for/with: pre gait, gait training, endurance , safety, equipment, neuromuscular re education.   Goals are: MinA. 9. OT will assess and treat for/with: ADLs, Cognitive perceptual skills, Neuromuscular re education, safety, endurance, equipment.   Goals are: minA. Therapy may proceed with showering this patient. 10. SLP will assess and treat for/with: Aphasia, swaloowing.  Goals are: Mod I med management , safe  po intake. 11. Case Management and Social Worker will assess and treat for psychological issues and discharge planning. 12. Team conference will be held weekly to assess progress toward goals and to determine barriers to discharge. 13. Patient will receive at least 3 hours of therapy per day at least 5 days per week. 14. ELOS: 19-22d       15. Prognosis:  good     PAM LOVE PAC Charlett Blake, MD 05/25/2016

## 2016-05-25 NOTE — Progress Notes (Signed)
  Speech Language Pathology Treatment: Dysphagia  Patient Details Name: Paige Stewart MRN: [id number] DOB: Jul 18, 1950 Today's Date: 05/25/2016 Time: 7829-5621 SLP Time Calculation (min) (ACUTE ONLY): 16 min  Assessment / Plan / Recommendation Clinical Impression  Patient seen for f/u diet tolerance assessment. Patient alert and cooperative, continues to require moderate cueing for use of compensatory strategies. Able to consume pureed solids and nectar thick liquids via self feeding with moderate HOH assist without overt indication of aspiration. Patient appropriate to advance to Dysphagia 1, nectar thick liquids per MBS results and performance today. Daughter present and educated. Will f/u for tolerance and readiness to advance.   HPI HPI: Paige Stewart a 66 y.o.female, visiting her daughter from [place] with right sided weakness, right facial droop, dysarthria. PMH is significant for DM2 ,HTN, diabetic neuropathy. CT head without hemorrhage, CT perfusion with small area of penumbra in the anterior MCA per neurology. MRI 05/21/16 showed acute left basal ganglia infarct, moderate chronic small vessel ischemic disease. No prior swallowing evaluations in chart. Failed nursing stroke swallow screen.      SLP Plan  Continue with current plan of care       Recommendations  Diet recommendations: Dysphagia 1 (puree);Nectar-thick liquid Liquids provided via: Cup Medication Administration: Whole meds with puree Supervision: Full supervision/cueing for compensatory strategies Compensations: Minimize environmental distractions;Slow rate;Small sips/bites Postural Changes and/or Swallow Maneuvers: Seated upright 90 degrees                Oral Care Recommendations: Oral care BID Follow up Recommendations: Inpatient Rehab SLP Visit Diagnosis: Dysphagia, oropharyngeal phase (R13.12) Plan: Continue with current plan of care       GO              Ferdinand Lango MA,  CCC-SLP [phone]   [name] 05/25/2016, 1:19 PM

## 2016-05-26 ENCOUNTER — Inpatient Hospital Stay (HOSPITAL_COMMUNITY): Payer: No Typology Code available for payment source

## 2016-05-26 ENCOUNTER — Inpatient Hospital Stay (HOSPITAL_COMMUNITY): Payer: No Typology Code available for payment source | Admitting: Physical Therapy

## 2016-05-26 ENCOUNTER — Inpatient Hospital Stay (HOSPITAL_COMMUNITY): Payer: No Typology Code available for payment source | Admitting: Speech Pathology

## 2016-05-26 ENCOUNTER — Inpatient Hospital Stay (HOSPITAL_COMMUNITY): Payer: No Typology Code available for payment source | Admitting: Occupational Therapy

## 2016-05-26 DIAGNOSIS — R609 Edema, unspecified: Secondary | ICD-10-CM

## 2016-05-26 LAB — CBC WITH DIFFERENTIAL/PLATELET
BASOS ABS: 0 10*3/uL (ref 0.0–0.1)
BASOS PCT: 0 %
Eosinophils Absolute: 0.1 10*3/uL (ref 0.0–0.7)
Eosinophils Relative: 2 %
HEMATOCRIT: 33.6 % — AB (ref 36.0–46.0)
HEMOGLOBIN: 11.7 g/dL — AB (ref 12.0–15.0)
Lymphocytes Relative: 35 %
Lymphs Abs: 1.8 10*3/uL (ref 0.7–4.0)
MCH: 30.4 pg (ref 26.0–34.0)
MCHC: 34.8 g/dL (ref 30.0–36.0)
MCV: 87.3 fL (ref 78.0–100.0)
MONO ABS: 0.4 10*3/uL (ref 0.1–1.0)
Monocytes Relative: 7 %
NEUTROS ABS: 2.9 10*3/uL (ref 1.7–7.7)
NEUTROS PCT: 56 %
Platelets: 256 10*3/uL (ref 150–400)
RBC: 3.85 MIL/uL — ABNORMAL LOW (ref 3.87–5.11)
RDW: 13.6 % (ref 11.5–15.5)
WBC: 5.1 10*3/uL (ref 4.0–10.5)

## 2016-05-26 LAB — COMPREHENSIVE METABOLIC PANEL
ALBUMIN: 2.9 g/dL — AB (ref 3.5–5.0)
ALT: 18 U/L (ref 14–54)
ANION GAP: 8 (ref 5–15)
AST: 17 U/L (ref 15–41)
Alkaline Phosphatase: 43 U/L (ref 38–126)
BILIRUBIN TOTAL: 0.7 mg/dL (ref 0.3–1.2)
BUN: 15 mg/dL (ref 6–20)
CO2: 24 mmol/L (ref 22–32)
Calcium: 9.2 mg/dL (ref 8.9–10.3)
Chloride: 107 mmol/L (ref 101–111)
Creatinine, Ser: 1.05 mg/dL — ABNORMAL HIGH (ref 0.44–1.00)
GFR calc Af Amer: >60 mL/min (ref >60)
GFR calc non Af Amer: 54 mL/min — ABNORMAL LOW (ref >60)
GLUCOSE: 187 mg/dL — AB (ref 65–99)
POTASSIUM: 3.8 mmol/L (ref 3.5–5.1)
SODIUM: 139 mmol/L (ref 135–145)
TOTAL PROTEIN: 6 g/dL — AB (ref 6.5–8.1)

## 2016-05-26 LAB — GLUCOSE, CAPILLARY
GLUCOSE-CAPILLARY: 172 mg/dL — AB (ref 65–99)
Glucose-Capillary: 188 mg/dL — ABNORMAL HIGH (ref 65–99)
Glucose-Capillary: 103 mg/dL — ABNORMAL HIGH (ref 65–99)

## 2016-05-26 MED ORDER — ORAL CARE MOUTH RINSE
15.0000 mL | Freq: Two times a day (BID) | OROMUCOSAL | Status: DC
  Administered 2016-05-26 – 2016-06-15 (×27): 15 mL via OROMUCOSAL

## 2016-05-26 MED ORDER — METFORMIN HCL 500 MG PO TABS
1000.0000 mg | ORAL_TABLET | Freq: Two times a day (BID) | ORAL | Status: DC
  Administered 2016-05-26 – 2016-05-29 (×7): 1000 mg via ORAL
  Filled 2016-05-26 (×9): qty 2

## 2016-05-26 NOTE — Care Management Note (Signed)
Inpatient Rehabilitation Center Individual Statement of Services  Patient Name:  Paige Stewart  Date:  05/26/2016  Welcome to the Inpatient Rehabilitation Center.  Our goal is to provide you with an individualized program based on your diagnosis and situation, designed to meet your specific needs.  With this comprehensive rehabilitation program, you will be expected to participate in at least 3 hours of rehabilitation therapies Monday-Friday, with modified therapy programming on the weekends.  Your rehabilitation program will include the following services:  Physical Therapy (PT), Occupational Therapy (OT), Speech Therapy (ST), 24 hour per day rehabilitation nursing, Case Management (Social Worker), Rehabilitation Medicine, Nutrition Services and Pharmacy Services  Weekly team conferences will be held on Wednesday to discuss your progress.  Your Social Worker will talk with you frequently to get your input and to update you on team discussions.  Team conferences with you and your family in attendance may also be held.  Expected length of stay: 19-22 days  Overall anticipated outcome: supervision with OT and mod assist with PT  Depending on your progress and recovery, your program may change. Your Social Worker will coordinate services and will keep you informed of any changes. Your Social Worker's name and contact numbers are listed  below.  The following services may also be recommended but are not provided by the Inpatient Rehabilitation Center:  Home Health Rehabiltiation Services  Outpatient Rehabilitation Services   Arrangements will be made to provide these services after discharge if needed.  Arrangements include referral to agencies that provide these services.  Your insurance has been verified to be:  [employer] Your primary doctor is:  MD in [place]  Pertinent information will be shared with your doctor and your insurance company.  Social Worker:  Dossie Der, SW [phone] or [phone]  Information discussed with and copy given to patient by: Lucy Chris, 05/26/2016, 11:39 AM

## 2016-05-26 NOTE — Progress Notes (Signed)
*  PRELIMINARY RESULTS* Vascular Ultrasound Bilateral lower extremity venous duplex has been completed.  Preliminary findings: No evidence of deep vein thrombosis in the visualized veins of the lower extremities.  Very small bakers cyst on the right.   Chauncey Fischer 05/26/2016, 9:04 AM

## 2016-05-26 NOTE — Evaluation (Signed)
Speech Language Pathology Assessment and Plan  Patient Details  Name: Paige Stewart MRN: [id number] Date of Birth: 1950-06-15  SLP Diagnosis: Dysphagia;Dysarthria;Cognitive Impairments;Aphasia  Rehab Potential: Good ELOS: 19-22 days     Today's Date: 05/26/2016 SLP Individual Time: 8315-1761 SLP Individual Time Calculation (min): 60 min   Problem List:  Patient Active Problem List   Diagnosis Date Noted  . Basal ganglia infarction ([place]) 05/25/2016  . Type 2 diabetes mellitus without complication, without long-term current use of insulin ([place])   . Urinary tract infection without hematuria   . Demand ischemia ([place])   . Hypertension   . Acute right hemiparesis ([place])   . 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 Diabetes mellitus without complication ([place])   . Hypertension   . Neuropathy [facility])    Past Surgical History:  Past Surgical History:  Procedure Laterality Date  . CESAREAN SECTION    . TEE WITHOUT CARDIOVERSION N/A 05/23/2016   Procedure: TRANSESOPHAGEAL ECHOCARDIOGRAM (TEE);  Surgeon: Dorothy Spark, MD;  Location: [facility] ENDOSCOPY;  Service: Cardiovascular;  Laterality: N/A;    Assessment / Plan / Recommendation Clinical Impression Patient is a 66 y.o.RH-female with history of DM with neuropathy, HTN--medication noncompliance, who was found down by family on 05/21/16 with right sided weakness. EKG with ST changes and positive cardiac enzymes. BP elevated at 193/1120 CT perfusion/CTA head/neck revealed hypoperfusion of left frontotemporal lobe and left frontal operculum, hypoperfusion of anterior branch L-MCA with severe stenosis Left M3 branch. CTA chest/pelvis done due to concerns of aortic dissection and was negative for acute aortic syndrome with incidental finding of moderate stenosis proxima. R-RA. 2 D echo with EF 40-45% with severe hypokinesis of mid apical anteroseptal and anterior myocardium c/w with  ischemia in distribution of LAD. Cardiology consulted for input and recommended outpatient work up for demand ischemia. TEE negative for intracardiac embolic source and moderate non-mobile atheroma.  Dr. Leonie Man felt OK to change to Plavix for stroke due to unknown source.  To consider outpatient NUC in a few months if still in [place]. She was found to have ESBL E coli in urine and antibiotic changed to bactrim today.   Therapy initiated and patient limited by lethargy, right sided weakness, left gaze preference, expressive/receptive aphasia, dysphagia. CIR recommended for follow up therapy and patient admitted 05/25/16.   Patient demonstrates moderate-severe cognitive impairments impacting initiation, sustained attention, attention to right field of environment, intellectual awareness, problem solving and recall which impacts her safety with functional and familiar tasks. Patient demonstrates severely impaired speech intelligibility due to a low vocal intensity and moderate oral-motor weakness which impacts her overall functional communication. Patient followed 1 step commands and answered basic questions in regards to biographical information with extra time. Patient verbalizing in [place] and in native language intermittently throughout session. Continued diagnostic treatment of language abilities is needed. Patient demonstrated prolonged AP transit, right anterior spillage and a suspected delayed swallow initiation with all trials resulting in overt coughing with ice chips and Dys. 2 trials. Patient also impulsive with PO intake and required hand over hand assist for rate control. Recommend patient continue current diet of Dys. 1 textures with nectar-thick liquids and full supervision. Patient would benefit from skilled SLP intervention to maximize her cognitive-linguistic and swallowing function in order to improve her overall functional independence prior to discharge.     Skilled Therapeutic Interventions           Administered a cognitive-linguistic  evaluation and BSE. Please see above for details.   SLP Assessment  Patient will need skilled Speech Lanaguage Pathology Services during CIR admission    Recommendations  SLP Diet Recommendations: Dysphagia 1 (Puree);Nectar Liquid Administration via: Cup Medication Administration: Crushed with puree Supervision: Full supervision/cueing for compensatory strategies;Patient able to self feed Compensations: Minimize environmental distractions;Slow rate;Small sips/bites;Monitor for anterior loss Postural Changes and/or Swallow Maneuvers: Seated upright 90 degrees Oral Care Recommendations: Oral care BID Recommendations for Other Services: Neuropsych consult Patient destination: Home Follow up Recommendations: Home Health SLP;24 hour supervision/assistance Equipment Recommended: To be determined    SLP Frequency 3 to 5 out of 7 days   SLP Duration  SLP Intensity  SLP Treatment/Interventions 19-22 days   Minumum of 1-2 x/day, 30 to 90 minutes  Cognitive remediation/compensation;Cueing hierarchy;Environmental controls;Dysphagia/aspiration precaution training;Internal/external aids;Speech/Language facilitation;Therapeutic Activities;Functional tasks;Patient/family education    Pain Pain Assessment Pain Assessment: Faces Pain Score: 0-No pain Faces Pain Scale: No hurt  Prior Functioning Type of Home: Apartment  Lives With: Family Available Help at Discharge: Family;Available PRN/intermittently  Function:  Eating Eating   Modified Consistency Diet: Yes Eating Assist Level: Set up assist for;Supervision or verbal cues   Eating Set Up Assist For: Opening containers       Cognition Comprehension Comprehension assist level: Understands basic 25 - 49% of the time/ requires cueing 50 - 75% of the time  Expression   Expression assist level: Expresses basis less than 25% of the time/requires cueing >75% of the time.  Social Interaction Social  Interaction assist level: Interacts appropriately 50 - 74% of the time - May be physically or verbally inappropriate.  Problem Solving Problem solving assist level: Solves basic 25 - 49% of the time - needs direction more than half the time to initiate, plan or complete simple activities  Memory Memory assist level: Recognizes or recalls less than 25% of the time/requires cueing greater than 75% of the time   Short Term Goals: Week 1: SLP Short Term Goal 1 (Week 1): Patient will consume current diet with minimal overt s/s of aspiration with Mod A verbal cues needed for use of swallowing compensatory strategies.  SLP Short Term Goal 2 (Week 1): Patient will utilize chin tuck with trials of thin liquids without overt s/s of aspiration in 75% of trials with Mod A verbal cues over 3 sessions prior to upgrade.  SLP Short Term Goal 3 (Week 1): Patient will utilize an increased vocal intensity at the word and phrase level to achieve ~75% intelligibility with Mod A verbal cues.  SLP Short Term Goal 4 (Week 1): Patient will name functional items with 75% accuracy and Min A verbal cues.  SLP Short Term Goal 5 (Week 1): Patient will attend to right field of enviornment during functional tasks with Max A multimodal cues. SLP Short Term Goal 6 (Week 1): Patient will initiate functional tasks with Mod A verbal and visual cues.   Refer to Care Plan for Long Term Goals  Recommendations for other services: Neuropsych  Discharge Criteria: Patient will be discharged from SLP if patient refuses treatment 3 consecutive times without medical reason, if treatment goals not met, if there is a change in medical status, if patient makes no progress towards goals or if patient is discharged from hospital.  The above assessment, treatment plan, treatment alternatives and goals were discussed and mutually agreed upon: by patient and by family  Ricahrd Schwager 05/26/2016, 1:48 PM

## 2016-05-26 NOTE — Progress Notes (Signed)
Patient information reviewed and entered into eRehab system by Adrin Julian, RN, CRRN, PPS Coordinator.  Information including medical coding and functional independence measure will be reviewed and updated through discharge.     Per nursing patient was given "Data Collection Information Summary for Patients in Inpatient Rehabilitation Facilities with attached "Privacy Act Statement-Health Care Records" upon admission.  

## 2016-05-26 NOTE — Evaluation (Signed)
Physical Therapy Assessment and Plan  Patient Details  Name: Paige Stewart MRN: [id number] Date of Birth: 10/16/1950  PT Diagnosis: Abnormality of gait, Cognitive deficits, Coordination disorder and Hemiparesis dominant Rehab Potential: Fair ELOS: 19-22 days   Today's Date: 05/26/2016 PT Individual Time: 1300-1410 PT Individual Time Calculation (min): 70 min    Problem List:  Patient Active Problem List   Diagnosis Date Noted  . Basal ganglia infarction ([place]) 05/25/2016  . Type 2 diabetes mellitus without complication, without long-term current use of insulin ([place])   . Urinary tract infection without hematuria   . Demand ischemia ([place])   . Hypertension   . Acute right hemiparesis ([place])   . 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 Diabetes mellitus without complication ([place])   . Hypertension   . Neuropathy [facility])    Past Surgical History:  Past Surgical History:  Procedure Laterality Date  . CESAREAN SECTION    . TEE WITHOUT CARDIOVERSION N/A 05/23/2016   Procedure: TRANSESOPHAGEAL ECHOCARDIOGRAM (TEE);  Surgeon: Dorothy Spark, MD;  Location: [facility] ENDOSCOPY;  Service: Cardiovascular;  Laterality: N/A;    Assessment & Plan Clinical Impression: A 66 y.o. RH-female with history of DM with neuropathy, HTN--medication noncompliance, who was found down by family on 05/21/16 with right sided weakness. EKG with ST changes and positive cardiac enzymes. 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  CTA chest/pelvis done due to concerns of aortic dissection and was negative for acute aortic syndrome with incidental finding of moderate stenosis proxima. R-RA.   2 D echo with EF 40-45% with severe hypokinesis of mid apical anteroseptal and anterior myocardium c/w with  ischemia in distribution of LAD. Cardiology consulted for input and recommended outpatient work up for demand ischemia.  TEE negative for intracardiac embolic source. Dr. Leonie Man recommended ASA for stroke due to unknown source. Therapy evaluations completed yesterday and patient limited by lethargy, right sided weakness, left gaze preference, expressive/receptive aphasia, dysphagia. CIR recommended for follow up therapy. Patient transferred to CIR on 05/25/2016 .   Patient currently requires total to +2 assist with mobility secondary to muscle weakness, impaired timing and sequencing, unbalanced muscle activation and decreased coordination, decreased attention to right, decreased attention, decreased awareness, decreased problem solving, decreased safety awareness, decreased memory and delayed processing and decreased sitting balance, decreased standing balance, decreased postural control, hemiplegia and decreased balance strategies.  Prior to hospitalization, patient was independent  with mobility and lived with Family in a [place] home.  Home access is 2 full flightsStairs to enter.  Patient will benefit from skilled PT intervention to maximize safe functional mobility, minimize fall risk and decrease caregiver burden for planned discharge home with 24 hour assist.  Anticipate patient will benefit from follow up [facility] at discharge.  PT - End of Session Activity Tolerance: Tolerates 30+ min activity with multiple rests Endurance Deficit: Yes Endurance Deficit Description: required multiple rests due to fatigue PT Assessment Rehab Potential (ACUTE/IP ONLY): Fair Barriers to Discharge: [place] home environment;Decreased caregiver support Barriers to Discharge Comments: Pt with temporary travel visa from [place], staying with daughter in 3rd floor apartment, daughter works during the day PT Patient demonstrates impairments in the following area(s): Balance;Endurance;Motor;Perception;Safety PT Transfers  Functional Problem(s): Bed Mobility;Bed to Chair;Car;Furniture PT Locomotion Functional Problem(s): Wheelchair Mobility;Ambulation;Stairs PT Plan PT Intensity: Minimum  of 1-2 x/day ,45 to 90 minutes PT Frequency: 5 out of 7 days PT Duration Estimated Length of Stay: 19-22 days PT Treatment/Interventions: Ambulation/gait training;DME/adaptive equipment instruction;Neuromuscular re-education;Psychosocial support;Stair training;UE/LE Strength taining/ROM;Wheelchair propulsion/positioning;Balance/vestibular training;Discharge planning;Functional electrical stimulation;Therapeutic Activities;UE/LE Coordination activities;Cognitive remediation/compensation;Functional mobility training;Patient/family education;Splinting/orthotics;Therapeutic Exercise;Visual/perceptual remediation/compensation PT Transfers Anticipated Outcome(s): min assist PT Locomotion Anticipated Outcome(s): supervision w/c level  PT Recommendation Follow Up Recommendations: Home health PT;24 hour supervision/assistance Patient destination: Home Equipment Recommended: Wheelchair (measurements) Equipment Details: 18x16 ultra hemi-height w/c  Skilled Therapeutic Intervention Pt c/o soreness in RLE, but does not rate.  Session focus on pt education regarding ELOS, plan of care, expected progress, and f/u recommendations, initial evaluation, transfers, and NMR via gait training.  Pt currently requiring total assist for squat/pivot, and max assist for sit<>stand.  Best attempt for sit<>stand with mod assist pulling up on bed rail when transferring back to bed at end of session.  NMR via gait training at rail in hallway x10' +5' with total assist for advancing and stabilizing RLE, trunk control, upright posture, weight shift, and sequencing.   PT Evaluation Precautions/Restrictions Precautions Precautions: Fall Precaution Comments: dense R hemiparesis Restrictions Weight Bearing Restrictions: No Home Living/Prior Functioning Home  Living Living Arrangements: Children Available Help at Discharge: Family;Available PRN/intermittently Type of Home: Apartment Home Access: Stairs to enter Entrance Stairs-Number of Steps: 2 full flights Home Layout: One level Additional Comments: Pt visiting daughter in [place], originally from [place]; family trying to arrange 24/7 assist  Lives With: Family Prior Function Level of Independence: Independent with transfers;Independent with gait  Able to Take Stairs?: Yes Driving: No Vision/Perception  Vision - Assessment Additional Comments: L gaze preference, R inattention but crosses midline and attends to R with verbal cues and increased time  Cognition Overall Cognitive Status: Impaired/Different from baseline Arousal/Alertness: Awake/alert Orientation Level: Oriented to person;Oriented to place;Oriented to time;Disoriented to situation (states "I've forgotten" when asked why she was in the hospital) Attention: Sustained Focused Attention: Appears intact Sustained Attention: Impaired Sustained Attention Impairment: Functional basic Memory: Impaired Memory Impairment: Decreased recall of new information;Decreased short term memory Decreased Short Term Memory: Verbal basic;Functional basic Awareness: Impaired Awareness Impairment: Intellectual impairment Problem Solving: Impaired Problem Solving Impairment: Functional basic Behaviors: Impulsive Safety/Judgment: Impaired Sensation Sensation Light Touch: Impaired by gross assessment (difficult to follow commands 2/2 lethargy) Coordination Gross Motor Movements are Fluid and Coordinated: No Fine Motor Movements are Fluid and Coordinated: No Motor  Motor Motor: Hemiplegia;Abnormal postural alignment and control Motor - Skilled Clinical Observations: poor postural control, dense R hemi  Mobility Bed Mobility Bed Mobility: Sit to Supine Sit to Supine: 3: Mod assist Transfers Transfers: Yes Sit to Stand: 2: Max assist Stand to  Sit: 2: Max assist Squat Pivot Transfers: 1: +1 Total assist Locomotion  Ambulation Ambulation: Yes Ambulation/Gait Assistance: 1: +2 Total assist Ambulation Distance (Feet): 10 Feet Assistive device:  (rail in hallway) Stairs / Additional Locomotion Stairs: No Wheelchair Mobility Wheelchair Mobility: No  Trunk/Postural Assessment  Cervical Assessment Cervical Assessment:  (L gaze preference) Thoracic Assessment Thoracic Assessment: Within Functional Limits Lumbar Assessment Lumbar Assessment: Exceptions to [facility] (preference for posterior pelvic tilt but can correct with cues) Postural Control Postural Control: Deficits on evaluation Righting Reactions: delayed Protective Responses: absent Postural Limitations: decreased  Balance Balance Balance Assessed: Yes Static Sitting Balance Static Sitting - Balance Support: Left upper extremity supported Static Sitting - Level of Assistance: 4: Min assist Static Standing Balance Static Standing - Balance Support: Bilateral upper extremity supported Static Standing - Level of  Assistance: 2: Max assist Extremity Assessment      RLE Assessment RLE Assessment: Exceptions to [facility] RLE AROM (degrees) RLE Overall AROM Comments: limited 2/2 weakness RLE PROM (degrees) RLE Overall PROM Comments: WFL RLE Strength RLE Overall Strength Comments: trace gross movement but unable to isolate  LLE Assessment LLE Assessment: Within Functional Limits LLE AROM (degrees) LLE Overall AROM Comments: WFL assessed in sitting LLE Strength LLE Overall Strength Comments: unable to formally assess 2/2 difficulty following commands for strength testing; pt is able to move against gravity and resist movement   See Function Navigator for Current Functional Status.   Refer to Care Plan for Long Term Goals  Recommendations for other services: None   Discharge Criteria: Patient will be discharged from PT if patient refuses treatment 3 consecutive times  without medical reason, if treatment goals not met, if there is a change in medical status, if patient makes no progress towards goals or if patient is discharged from hospital.  The above assessment, treatment plan, treatment alternatives and goals were discussed and mutually agreed upon: by patient and by family  Urban Gibson E Penven-Crew 05/26/2016, 4:20 PM

## 2016-05-26 NOTE — Progress Notes (Signed)
Social Work Assessment and Plan Social Work Assessment and Plan  Patient Details  Name: Paige Stewart MRN: [id number] Date of Birth: 1950-12-28  Today's Date: 05/26/2016  Problem List:  Patient Active Problem List   Diagnosis Date Noted  . Basal ganglia infarction ([facility]) 05/25/2016  . Type 2 diabetes mellitus without complication, without long-term current use of insulin ([facility])   . Urinary tract infection without hematuria   . Demand ischemia ([facility])   . Hypertension   . Acute right hemiparesis ([facility])   . 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 Diabetes mellitus without complication ([facility])   . Hypertension   . Neuropathy [facility])    Past Surgical History:  Past Surgical History:  Procedure Laterality Date  . CESAREAN SECTION    . TEE WITHOUT CARDIOVERSION N/A 05/23/2016   Procedure: TRANSESOPHAGEAL ECHOCARDIOGRAM (TEE);  Surgeon: Lars Masson, MD;  Location: [facility] ENDOSCOPY;  Service: Cardiovascular;  Laterality: N/A;   Social History:  reports that she has never smoked. She has never used smokeless tobacco. She reports that she does not drink alcohol or use drugs.  Family / Support Systems Marital Status: Widow/Widower Patient Roles: Parent Children: Mercy-daughter [phone] Other Supports: Bob-son in-law [phone]-cell Anticipated Caregiver: Daughter and son in-law  Ability/Limitations of Caregiver: Both daughter and son in-law work and are aware pt may require care at discharge Caregiver Availability: Other (Comment) (Daughter coming up with a plan) Family Dynamics: Pt was here visiting from [place] was to go back end of April, has been here since Nov. Daughter and son in-law are the only family she has here. She has more family in [place] but would need to be fit to travel on long flight.  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 Will look toward her daughter to make any decisions while here   Abuse/Neglect Physical Abuse: Denies Verbal Abuse: Denies Sexual Abuse: Denies Exploitation of patient/patient's resources: Denies Self-Neglect: Denies  Emotional Status Pt's affect, behavior adn adjustment status: Pt is very quiet and tired from therapies. She is glad to be here on rehab, due to last step before going back home. She has always been independent and taken care of herself. Daughter confirms this and reports she is very stubborn also Recent Psychosocial Issues: health issues-HTN not being managed, activity limited due to neuropathy Pyschiatric History: No history deferred depression screen due to pt tired from therapies. Will allow pt to adjust to the new unit and the program and then see if would benefit from seeing neuro-psych-experiencing many losses and wanting to go home soon. Substance Abuse History: No issues  Patient / Family Perceptions, Expectations & Goals Pt/Family understanding of illness & functional limitations: Pt and daughter can explain her stroke and deficits. They do talk with the MD daily, pt usually allows her daughter to speak for her. Daughter has been here while pt has been a pt in the hospital but will need to go back to work soon. 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, retiree, sister, home owner, etc Anticipated changes in roles/activities/participation: resume Pt/family expectations/goals: Pt states: " I want to do well here."  Daughter states: " I hope she does well and can  go back home soon that is where she wants to be."  [employer]:  None Premorbid Home Care/DME Agencies: None Transportation available at discharge: Daughter and son in-law  Discharge Planning Living Arrangements: Children Support Systems: Children, Other relatives, Manufacturing engineer, Psychologist, clinical community Type of Residence: Private residence Insurance Resources: Media planner (specify) Surveyor, minerals) Financial Resources: Tree surgeon, Family Support Financial Screen Referred: No Living Expenses: Lives with family Money Management: Family Does the patient have any problems obtaining your medications?: No Home Management: Daughter and pt while she has been here visiting, does herself at home in [place] Patient/Family Preliminary Plans: Return to daughter's home which is on the third floor-32 steps up. Daughter is working on 24 hr care plan, since she and her husband work. Will need to wait for medical clearance to return to her home in [place]. Both aware of this. Will await therapy evaluations and work on a safe plan. Social Work Anticipated Follow Up Needs: HH/OP, Support Group  Clinical Impression Pleasant, quiet female who is trying to adjust to the rehab program and being in a hospital in the [place]. She was planning on going back to [place] end of the month when this happened. Her daughter and son in-law are very involved and supportive.  Daughter is coming up with a plan for care upon discharge for pt. Will await therapy evaluations and work on the plan. Pt may benefit from seeing neuro-psych while here. Will allow her to adjust first and ask daughter and pt if any culture Norms we need to be aware of while here.  Lucy Chris 05/26/2016, 2:02 PM

## 2016-05-26 NOTE — Progress Notes (Signed)
Subjective/Complaints:   Objective: Vital Signs: Blood pressure 127/63, pulse 77, temperature 98.5 F (36.9 C), temperature source Oral, resp. rate 18, height 4' 11"  (1.499 m), weight 67.8 kg (149 lb 7.6 oz), SpO2 97 %. No results found. Results for orders placed or performed during the hospital encounter of 05/21/16 (from the past 72 hour(s))  Glucose, capillary     Status: Abnormal   Collection Time: 05/23/16  7:32 AM  Result Value Ref Range   Glucose-Capillary 182 (H) 65 - 99 mg/dL  Glucose, capillary     Status: Abnormal   Collection Time: 05/23/16 12:19 PM  Result Value Ref Range   Glucose-Capillary 169 (H) 65 - 99 mg/dL  Glucose, capillary     Status: Abnormal   Collection Time: 05/23/16  4:00 PM  Result Value Ref Range   Glucose-Capillary 197 (H) 65 - 99 mg/dL  Glucose, capillary     Status: Abnormal   Collection Time: 05/23/16  8:11 PM  Result Value Ref Range   Glucose-Capillary 177 (H) 65 - 99 mg/dL  Glucose, capillary     Status: Abnormal   Collection Time: 05/24/16  1:28 AM  Result Value Ref Range   Glucose-Capillary 215 (H) 65 - 99 mg/dL  CBC     Status: None   Collection Time: 05/24/16  5:12 AM  Result Value Ref Range   WBC 4.1 4.0 - 10.5 K/uL   RBC 4.12 3.87 - 5.11 MIL/uL   Hemoglobin 12.3 12.0 - 15.0 g/dL   HCT 36.0 36.0 - 46.0 %   MCV 87.4 78.0 - 100.0 fL   MCH 29.9 26.0 - 34.0 pg   MCHC 34.2 30.0 - 36.0 g/dL   RDW 13.2 11.5 - 15.5 %   Platelets 243 150 - 400 K/uL  Basic metabolic panel     Status: Abnormal   Collection Time: 05/24/16  5:12 AM  Result Value Ref Range   Sodium 137 135 - 145 mmol/L   Potassium 4.0 3.5 - 5.1 mmol/L    Comment: DELTA CHECK NOTED   Chloride 109 101 - 111 mmol/L   CO2 22 22 - 32 mmol/L   Glucose, Bld 220 (H) 65 - 99 mg/dL   BUN 9 6 - 20 mg/dL   Creatinine, Ser 0.69 0.44 - 1.00 mg/dL   Calcium 8.6 (L) 8.9 - 10.3 mg/dL   GFR calc non Af Amer >60 >60 mL/min   GFR calc Af Amer >60 >60 mL/min    Comment: (NOTE) The  eGFR has been calculated using the CKD EPI equation. This calculation has not been validated in all clinical situations. eGFR's persistently <60 mL/min signify possible Chronic Kidney Disease.    Anion gap 6 5 - 15  Glucose, capillary     Status: Abnormal   Collection Time: 05/24/16  7:29 AM  Result Value Ref Range   Glucose-Capillary 192 (H) 65 - 99 mg/dL  Glucose, capillary     Status: Abnormal   Collection Time: 05/24/16 11:40 AM  Result Value Ref Range   Glucose-Capillary 226 (H) 65 - 99 mg/dL  Glucose, capillary     Status: Abnormal   Collection Time: 05/24/16  4:20 PM  Result Value Ref Range   Glucose-Capillary 197 (H) 65 - 99 mg/dL   Comment 1 Notify RN    Comment 2 Document in Chart   Glucose, capillary     Status: Abnormal   Collection Time: 05/24/16  8:47 PM  Result Value Ref Range   Glucose-Capillary 261 (H)  65 - 99 mg/dL  Glucose, capillary     Status: Abnormal   Collection Time: 05/25/16 12:42 AM  Result Value Ref Range   Glucose-Capillary 242 (H) 65 - 99 mg/dL  CBC     Status: None   Collection Time: 05/25/16  4:04 AM  Result Value Ref Range   WBC 8.8 4.0 - 10.5 K/uL   RBC 4.23 3.87 - 5.11 MIL/uL   Hemoglobin 12.5 12.0 - 15.0 g/dL   HCT 36.5 36.0 - 46.0 %   MCV 86.3 78.0 - 100.0 fL   MCH 29.6 26.0 - 34.0 pg   MCHC 34.2 30.0 - 36.0 g/dL   RDW 13.2 11.5 - 15.5 %   Platelets 254 150 - 400 K/uL  Basic metabolic panel     Status: Abnormal   Collection Time: 05/25/16  4:04 AM  Result Value Ref Range   Sodium 136 135 - 145 mmol/L   Potassium 3.4 (L) 3.5 - 5.1 mmol/L   Chloride 104 101 - 111 mmol/L   CO2 24 22 - 32 mmol/L   Glucose, Bld 242 (H) 65 - 99 mg/dL   BUN 7 6 - 20 mg/dL   Creatinine, Ser 0.64 0.44 - 1.00 mg/dL   Calcium 8.9 8.9 - 10.3 mg/dL   GFR calc non Af Amer >60 >60 mL/min   GFR calc Af Amer >60 >60 mL/min    Comment: (NOTE) The eGFR has been calculated using the CKD EPI equation. This calculation has not been validated in all clinical  situations. eGFR's persistently <60 mL/min signify possible Chronic Kidney Disease.    Anion gap 8 5 - 15  Glucose, capillary     Status: Abnormal   Collection Time: 05/25/16  4:51 AM  Result Value Ref Range   Glucose-Capillary 223 (H) 65 - 99 mg/dL  Glucose, capillary     Status: Abnormal   Collection Time: 05/25/16  7:17 AM  Result Value Ref Range   Glucose-Capillary 218 (H) 65 - 99 mg/dL  Glucose, capillary     Status: Abnormal   Collection Time: 05/25/16 12:01 PM  Result Value Ref Range   Glucose-Capillary 293 (H) 65 - 99 mg/dL  Glucose, capillary     Status: Abnormal   Collection Time: 05/25/16  5:28 PM  Result Value Ref Range   Glucose-Capillary 231 (H) 65 - 99 mg/dL     HEENT: normal Cardio: RRR and no murmur Resp: CTA B/L and unlabored GI: BS positive and NT, ND Extremity:  Pulses positive and No Edema Skin:   Intact Neuro: Flat, Abnormal Sensory reduced sensation to pinch RUE and Abnormal Motor 0/5 in RUE and RLE Musc/Skel:  Other no joint swelling noted Gen NAD   Assessment/Plan: 1. Functional deficits secondary to Basal ganglia infarct which require 3+ hours per day of interdisciplinary therapy in a comprehensive inpatient rehab setting. Physiatrist is providing close team supervision and 24 hour management of active medical problems listed below. Physiatrist and rehab team continue to assess barriers to discharge/monitor patient progress toward functional and medical goals. FIM:                                  Medical Problem List and Plan: 1.  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  DVT Prophylaxis/Anticoagulation: Pharmaceutical: Lovenox, await LE vascular study 3. Pain Management: will add Sportscreme for back pain.  4. Mood: LCSW  to follow for evaluation and support.  5. Neuropsych: This patient is not capable of making decisions on her own behalf. 6. Skin/Wound Care: pressure  relief measures. 7. Fluids/Electrolytes/Nutrition: Monitor I/O. Check lytes in am. 8. HTN: monitor BP bid--continue HCTZ.  9. Hypokalemia: will add supplement. 10. ESBL E coli UTI: Changed to septra DS D# 2 11. NSTEMI: Will need follow up NUC study in the future.  12. T2DM with peripheral neuropathy: Monitor BS ac/hs. Hgb A1c- 10.3-poorly controlled. Continue metformin 1066m BID and add Amaryl 116m Question ability to manage insulin especially is she has to go back to [facility] (last 3)   Recent Labs  05/25/16 0717 05/25/16 1201 05/25/16 1728  GLUCAP 218* 293* 231*     LOS (Days) 1 A FACE TO FACE EVALUATION WAS PERFORMED  Shlonda Dolloff E 05/26/2016, 7:01 AM

## 2016-05-26 NOTE — Evaluation (Signed)
Occupational Therapy Assessment and Plan  Patient Details  Name: Paige Stewart MRN: [id number] Date of Birth: [birth year]  OT Diagnosis: cognitive deficits, hemiplegia affecting dominant side and muscle weakness (generalized) Rehab Potential: Rehab Potential (ACUTE ONLY): Good ELOS: 19-22 days   Today's Date: 05/26/2016 OT Individual Time: 1015-1130 OT Individual Time Calculation (min): 75 min     Problem List:  Patient Active Problem List   Diagnosis Date Noted  . Basal ganglia infarction ([place]) 05/25/2016  . Type 2 diabetes mellitus without complication, without long-term current use of insulin ([place])   . Urinary tract infection without hematuria   . Demand ischemia ([place])   . Hypertension   . Acute right hemiparesis ([place])   . Stroke (cerebrum) ([place]) 05/21/2016    Past Medical History:  Past Medical History:  Diagnosis Date  . Diabetes mellitus without complication ([place])   . Hypertension   . Neuropathy [facility])    Past Surgical History:  Past Surgical History:  Procedure Laterality Date  . CESAREAN SECTION    . TEE WITHOUT CARDIOVERSION N/A 05/23/2016   Procedure: TRANSESOPHAGEAL ECHOCARDIOGRAM (TEE);  Surgeon: Dorothy Spark, MD;  Location: [facility] ENDOSCOPY;  Service: Cardiovascular;  Laterality: N/A;    Assessment & Plan Clinical Impression: Patient is a 66 y.o. year old female with recent admission to the hospital on 05/21/2016 with R-side weakness, R facial droop, and dysarthria; pt from [place] and was visiting family in [place]. CT angio shows hypoperfusion in L frontotemporal lobe, L frontal operculum , and anterior brance of L MCA. TEE on 4/3 shows no intracardiac source of embolism. 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 Pertinent PMH includes DM, HTN, peripheral neuropathy. Patient transferred to CIR on 05/25/2016 .    Patient currently requires total with basic self-care skills secondary to muscle weakness, abnormal tone, decreased coordination and decreased motor planning,  decreased attention to right, decreased initiation, decreased attention, decreased awareness, decreased problem solving, decreased memory and delayed processing and decreased sitting balance, decreased standing balance, decreased postural control, hemiplegia and decreased balance strategies.  Prior to hospitalization, patient could complete BADL with independent .  Patient will benefit from skilled intervention to decrease level of assist with basic self-care skills prior to discharge home with care partner.  Anticipate patient will require 24 hour supervision and minimal physical assistance and follow up home health.  OT - End of Session Endurance Deficit: Yes Endurance Deficit Description: Multiple rest breaks required during ADL session OT Assessment Rehab Potential (ACUTE ONLY): Good Barriers to Discharge: Inaccessible home environment Barriers to Discharge Comments: Pt's daughter lives on 3rd floor apartment OT Patient demonstrates impairments in the following area(s): Balance;Cognition;Endurance;Motor;Perception;Sensory OT Basic ADL's Functional Problem(s): Eating;Grooming;Bathing;Dressing;Toileting OT Transfers Functional Problem(s): Toilet;Tub/Shower OT Additional Impairment(s): Fuctional Use of Upper Extremity OT Plan OT Intensity: Minimum of 1-2 x/day, 45 to 90 minutes OT Frequency: 5 out of 7 days OT Duration/Estimated Length of Stay: 19-22 days OT Treatment/Interventions: Balance/vestibular training;Cognitive remediation/compensation;Discharge planning;DME/adaptive equipment instruction;Functional electrical stimulation;Functional mobility training;Neuromuscular re-education;Pain management;Patient/family education;Self Care/advanced ADL retraining;Therapeutic Activities;Therapeutic Exercise;UE/LE Strength taining/ROM;UE/LE Coordination activities;Wheelchair propulsion/positioning OT Self Feeding Anticipated Outcome(s): Mod I OT Basic Self-Care Anticipated Outcome(s): Supervision OT  Toileting Anticipated Outcome(s): Supervision OT Bathroom Transfers Anticipated Outcome(s): Min A OT Recommendation Patient destination: Home Follow Up Recommendations: Home health OT Equipment Recommended: To be determined   Skilled Therapeutic Intervention OT eval completed addressing rehab process, OT purpose, POC, ELOS, and goals. Treatment provided to address modified bathing/dressing, R attention, functional use of R UE, and transfers. Sup<>sit with  Max A, HOB elevated, and multimodal cues to initiate task. MaxA to min guard for seated balance; significant R lateral lean requiring multimodal cues to achieve upright. Bathing completed at EOB with daughter providing assistance for balance while OT assisted with bathing tasks. Pt able to attend to R UE to wash arm with min cues. Lateral transfer bed>wc w/ total A 2/2 initiation and motor planning deficits. Sit<>stand at the sink with Mod/Max+2, multimodal cues for hand placement and initiation. Mod/Max A to maintain standing balance for total A to wash bottom and pull pants over hips. Educated pt and daughter on self-ROM techniques and pt completed 10 reps of shoulder extension w/ OT providing elbow support. Pt able to count 1-10 with increased time and cues to initiate number sequence. Pt left seated in wc at end of session with needs met, family present, and safety belt on.   OT Evaluation Precautions/Restrictions  Precautions Precautions: Fall Restrictions Weight Bearing Restrictions: No Pain Pain Assessment Pain Assessment: No/denies pain Home Living/Prior Functioning Home Living Family/patient expects to be discharged to:: Private residence Living Arrangements: Children Available Help at Discharge: Family Type of Home: Apartment Home Access: Stairs to enter Technical brewer of Steps: 3 flights Bathroom Shower/Tub: Chiropodist: Standard Additional Comments: Pt from [place], visiting daughter in [place];  daughter lives in 3rd floor apartment. IADL History Homemaking Responsibilities: No IADL Comments: Culturally, children do all of the iADL, but pt makes up her bed daily and does dishes occasionally Prior Function Level of Independence: Independent with basic ADLs, Independent with homemaking with ambulation ADL ADL ADL Comments: Please see functional navigator Vision/Perception  Vision- History Baseline Vision/History: Wears glasses Wears Glasses: Reading only Vision- Assessment Additional Comments: L sided gaze preference and R inattention but able to cross midline and attend to  R side with cues. Praxis Praxis: Impaired  Cognition Overall Cognitive Status: Impaired/Different from baseline (Difficult to assess 2/2 aphasia) Arousal/Alertness: Awake/alert Orientation Level: Person (difficult to assess 2/2 aphasia) Year: Other (Comment) (1820) Month:  (UTA 2/2 aphasia) Day of Week: Incorrect Immediate Memory Recall:  (unable to repeat "sock, blue, bed") Awareness: Impaired Problem Solving: Impaired Sensation Sensation Light Touch: Impaired Detail Light Touch Impaired Details: Impaired RUE;Impaired RLE Stereognosis: Not tested Hot/Cold: Not tested Proprioception: Not tested Coordination Gross Motor Movements are Fluid and Coordinated: No Fine Motor Movements are Fluid and Coordinated: No Motor  Motor Motor: Hemiplegia Mobility  Bed Mobility Bed Mobility: Supine to Sit Supine to Sit: 2: Max assist;With rails;HOB elevated Supine to Sit Details: Manual facilitation for weight shifting;Verbal cues for technique;Manual facilitation for placement;Verbal cues for sequencing  Balance Balance Balance Assessed: Yes Static Sitting Balance Static Sitting - Balance Support: Left upper extremity supported Static Sitting - Level of Assistance: 4: Min assist Dynamic Sitting Balance Dynamic Sitting - Balance Support: During functional activity Dynamic Sitting - Level of Assistance:  2: Max assist;3: Mod assist Static Standing Balance Static Standing - Balance Support: Bilateral upper extremity supported Static Standing - Level of Assistance: 2: Max assist  Extremity/Trunk Assessment RUE Assessment RUE Assessment: Exceptions to [facility] RUE Strength RUE Overall Strength Comments: Trace muscle activation R UE proximally LUE Assessment LUE Assessment: Within Functional Limits   See Function Navigator for Current Functional Status.   Refer to Care Plan for Long Term Goals  Recommendations for other services: None    Discharge Criteria: Patient will be discharged from OT if patient refuses treatment 3 consecutive times without medical reason, if treatment goals not met, if there is  a change in medical status, if patient makes no progress towards goals or if patient is discharged from hospital.  The above assessment, treatment plan, treatment alternatives and goals were discussed and mutually agreed upon: by patient and by family  Valma Cava 05/26/2016, 12:23 PM

## 2016-05-26 NOTE — IPOC Note (Signed)
Overall Plan of Care [facility]) Patient Details Name: Paige Stewart MRN: [id number] DOB: [birth year]  Admitting Diagnosis: L MCA Infarct   Hospital Problems: Active Problems:   Basal ganglia infarction [facility])     Functional Problem List: Nursing Bladder, Bowel, Endurance, Medication Management, Motor, Sensory, Safety, Nutrition  PT Balance, Endurance, Motor, Perception, Safety  OT Balance, Cognition, Endurance, Motor, Perception, Sensory  SLP Cognition, Linguistic, Nutrition  TR         Basic ADL's: OT Eating, Grooming, Bathing, Dressing, Toileting     Advanced  ADL's: OT       Transfers: PT Bed Mobility, Bed to Chair, Car, Occupational psychologist, Research scientist (life sciences): PT Psychologist, prison and probation services, Ambulation, Stairs     Additional Impairments: OT Fuctional Use of Upper Extremity  SLP Swallowing, Communication, Social Cognition expression, comprehension Social Interaction, Problem Solving, Memory, Attention, Awareness  TR      Anticipated Outcomes Item Anticipated Outcome  Self Feeding Mod I  Swallowing  Min A   Basic self-care  Supervision  Toileting  Supervision   Bathroom Transfers Min A  Bowel/Bladder  Mod assist  Transfers  min assist  Locomotion  supervision w/c level   Communication  Min A  Cognition  Min A   Pain  < 3  Safety/Judgment  Mod assist   Therapy Plan: PT Intensity: Minimum of 1-2 x/day ,45 to 90 minutes PT Frequency: 5 out of 7 days PT Duration Estimated Length of Stay: 19-22 days OT Intensity: Minimum of 1-2 x/day, 45 to 90 minutes OT Frequency: 5 out of 7 days OT Duration/Estimated Length of Stay: 19-22 days SLP Intensity: Minumum of 1-2 x/day, 30 to 90 minutes SLP Frequency: 3 to 5 out of 7 days SLP Duration/Estimated Length of Stay: 19-22 days        Team Interventions: Nursing Interventions Patient/Family Education, Bladder Management, Bowel Management, Medication Management, Disease Management/Prevention, Cognitive  Remediation/Compensation, Dysphagia/Aspiration Precaution Training  PT interventions Ambulation/gait training, DME/adaptive equipment instruction, Neuromuscular re-education, Psychosocial support, Stair training, UE/LE Strength taining/ROM, Wheelchair propulsion/positioning, Warden/ranger, Discharge planning, Functional electrical stimulation, Therapeutic Activities, UE/LE Coordination activities, Cognitive remediation/compensation, Functional mobility training, Patient/family education, Splinting/orthotics, Therapeutic Exercise, Visual/perceptual remediation/compensation  OT Interventions Balance/vestibular training, Cognitive remediation/compensation, Discharge planning, DME/adaptive equipment instruction, Functional electrical stimulation, Functional mobility training, Neuromuscular re-education, Pain management, Patient/family education, Self Care/advanced ADL retraining, Therapeutic Activities, Therapeutic Exercise, UE/LE Strength taining/ROM, UE/LE Coordination activities, Wheelchair propulsion/positioning  SLP Interventions Cognitive remediation/compensation, Cueing hierarchy, Environmental controls, Dysphagia/aspiration precaution training, Internal/external aids, Speech/Language facilitation, Therapeutic Activities, Functional tasks, Patient/family education  TR Interventions    SW/CM Interventions Discharge Planning, Psychosocial Support, Patient/Family Education    Team Discharge Planning: Destination: PT-Home ,OT- Home , SLP-Home Projected Follow-up: PT-Home health PT, 24 hour supervision/assistance, OT-  Home health OT, SLP-Home Health SLP, 24 hour supervision/assistance Projected Equipment Needs: PT-Wheelchair (measurements), OT- To be determined, SLP-To be determined Equipment Details: PT-18x16 ultra hemi-height w/c, OT-  Patient/family involved in discharge planning: PT- Patient, Family member/caregiver,  OT-Patient, Family member/caregiver, SLP-Patient, Family  member/caregiver  MD ELOS: 18-22 days. Medical Rehab Prognosis:  Good Assessment: 66 y.o.RH-female with history of DM with neuropathy, HTN--medication noncompliance, who was found down by family on 05/21/16 with right sided weakness. EKG with ST changes and positive cardiac enzymes. BP elevated at 193/1120 CT perfusion/CTA head/neck revealed hypoperfusion of left frontotemporal lobe and left frontal operculum, hypoperfusion of anterior branch L-MCA with severe stenosis Left M3 branch. CTA chest/pelvis done due to concerns of aortic dissection and was  negative for acute aortic syndrome with incidental finding of moderate stenosis proxima. R-RA. 2 D echo with EF 40-45% with severe hypokinesis of mid apical anteroseptal and anterior myocardium c/w with ischemia in distribution of LAD. Cardiology consulted for input and recommended outpatient work up for demand ischemia. TEE negative for intracardiac embolic source and moderate non-mobile atheroma.  Dr. Pearlean Brownie felt OK to change to Plavix for stroke due to unknown source.  To consider outpatient NUC in a few months if still in [place]. She was found to have ESBL E coli in urine and antibiotic changed to bactrim.   Therapy initiated and patient limited by lethargy, right sided weakness, left gaze preference, expressive/receptive aphasia, dysphagia. Will set goals for Supervision/Min A with PT/OT and Min/Mod A with SLP.   See Team Conference Notes for weekly updates to the plan of care

## 2016-05-27 ENCOUNTER — Inpatient Hospital Stay (HOSPITAL_COMMUNITY): Payer: No Typology Code available for payment source | Admitting: Occupational Therapy

## 2016-05-27 ENCOUNTER — Inpatient Hospital Stay (HOSPITAL_COMMUNITY): Payer: No Typology Code available for payment source | Admitting: Speech Pathology

## 2016-05-27 ENCOUNTER — Inpatient Hospital Stay (HOSPITAL_COMMUNITY): Payer: No Typology Code available for payment source | Admitting: Physical Therapy

## 2016-05-27 DIAGNOSIS — N390 Urinary tract infection, site not specified: Secondary | ICD-10-CM

## 2016-05-27 DIAGNOSIS — I10 Essential (primary) hypertension: Secondary | ICD-10-CM

## 2016-05-27 DIAGNOSIS — B9620 Unspecified Escherichia coli [E. coli] as the cause of diseases classified elsewhere: Secondary | ICD-10-CM

## 2016-05-27 DIAGNOSIS — G8191 Hemiplegia, unspecified affecting right dominant side: Secondary | ICD-10-CM

## 2016-05-27 DIAGNOSIS — I214 Non-ST elevation (NSTEMI) myocardial infarction: Secondary | ICD-10-CM

## 2016-05-27 DIAGNOSIS — E1142 Type 2 diabetes mellitus with diabetic polyneuropathy: Secondary | ICD-10-CM

## 2016-05-27 NOTE — Progress Notes (Signed)
Subjective/Complaints: Pt seen laying in bed this AM.  She slept well overnight.   ROS: Denies CP, SOB, N/V/D.  Objective: Vital Signs: Blood pressure 122/73, pulse 86, temperature 98.3 F (36.8 C), temperature source Oral, resp. rate 18, height _0  (1.499 m), weight 67.8 kg (149 lb 7.6 oz), SpO2 100 %. No results found. Results for orders placed or performed during the hospital encounter of 05/25/16 (from the past 72 hour(s))  Glucose, capillary     Status: Abnormal   Collection Time: 05/25/16  9:18 PM  Result Value Ref Range   Glucose-Capillary 188 (H) 65 - 99 mg/dL  Glucose, capillary     Status: Abnormal   Collection Time: 05/26/16  6:38 AM  Result Value Ref Range   Glucose-Capillary 103 (H) 65 - 99 mg/dL  CBC WITH DIFFERENTIAL     Status: Abnormal   Collection Time: 05/26/16  7:00 AM  Result Value Ref Range   WBC 5.1 4.0 - 10.5 K/uL   RBC 3.85 (L) 3.87 - 5.11 MIL/uL   Hemoglobin 11.7 (L) 12.0 - 15.0 g/dL   HCT 33.6 (L) 36.0 - 46.0 %   MCV 87.3 78.0 - 100.0 fL   MCH 30.4 26.0 - 34.0 pg   MCHC 34.8 30.0 - 36.0 g/dL   RDW 13.6 11.5 - 15.5 %   Platelets 256 150 - 400 K/uL   Neutrophils Relative % 56 %   Neutro Abs 2.9 1.7 - 7.7 K/uL   Lymphocytes Relative 35 %   Lymphs Abs 1.8 0.7 - 4.0 K/uL   Monocytes Relative 7 %   Monocytes Absolute 0.4 0.1 - 1.0 K/uL   Eosinophils Relative 2 %   Eosinophils Absolute 0.1 0.0 - 0.7 K/uL   Basophils Relative 0 %   Basophils Absolute 0.0 0.0 - 0.1 K/uL  Comprehensive metabolic panel     Status: Abnormal   Collection Time: 05/26/16  7:00 AM  Result Value Ref Range   Sodium 139 135 - 145 mmol/L   Potassium 3.8 3.5 - 5.1 mmol/L   Chloride 107 101 - 111 mmol/L   CO2 24 22 - 32 mmol/L   Glucose, Bld 187 (H) 65 - 99 mg/dL   BUN 15 6 - 20 mg/dL   Creatinine, Ser 1.05 (H) 0.44 - 1.00 mg/dL   Calcium 9.2 8.9 - 10.3 mg/dL   Total Protein 6.0 (L) 6.5 - 8.1 g/dL   Albumin 2.9 (L) 3.5 - 5.0 g/dL   AST 17 15 - 41 U/L   ALT 18 14 - 54  U/L   Alkaline Phosphatase 43 38 - 126 U/L   Total Bilirubin 0.7 0.3 - 1.2 mg/dL   GFR calc non Af Amer 54 (L) >60 mL/min   GFR calc Af Amer >60 >60 mL/min    Comment: (NOTE) The eGFR has been calculated using the CKD EPI equation. This calculation has not been validated in all clinical situations. eGFR's persistently <60 mL/min signify possible Chronic Kidney Disease.    Anion gap 8 5 - 15  Glucose, capillary     Status: Abnormal   Collection Time: 05/26/16 12:02 PM  Result Value Ref Range   Glucose-Capillary 172 (H) 65 - 99 mg/dL     HEENT: Normocephalic, atraumatic Cardio: RRR. No JVD. Resp: CTA B/L and unlabored GI: BS positive and ND Skin:   Intact. Warm and dry. Neuro: Flat,  Motor 0/5 in RUE and RLE Musc/Skel:  No edema. No tenderness. Gen NAD. Vital signs reviewed.  Assessment/Plan: 1. Functional deficits secondary to Basal ganglia infarct which require 3+ hours per day of interdisciplinary therapy in a comprehensive inpatient rehab setting. Physiatrist is providing close team supervision and 24 hour management of active medical problems listed below. Physiatrist and rehab team continue to assess barriers to discharge/monitor patient progress toward functional and medical goals. FIM: Function - Bathing Position: Sitting EOB Body parts bathed by patient: Right arm, Chest, Abdomen, Front perineal area, Right upper leg, Left upper leg Body parts bathed by helper: Right lower leg, Left lower leg, Buttocks, Left arm Assist Level: 2 helpers  Function- Upper Body Dressing/Undressing What is the patient wearing?: Button up shirt Button up shirt - Perfomed by helper: Thread/unthread right sleeve, Button/unbutton shirt, Pull shirt around back, Thread/unthread left sleeve Assist Level:  (Total A) Function - Lower Body Dressing/Undressing What is the patient wearing?: Non-skid slipper socks, Pants Position: Wheelchair/chair at sink Pants- Performed by helper:  Thread/unthread right pants leg, Thread/unthread left pants leg, Pull pants up/down Non-skid slipper socks- Performed by helper: Don/doff right sock, Don/doff left sock Assist for footwear: Dependant Assist for lower body dressing: 2 Helpers  Function - Toileting Toileting activity did not occur: No continent bowel/bladder event Toileting steps completed by helper: Adjust clothing prior to toileting, Performs perineal hygiene, Adjust clothing after toileting Assist level: Two helpers  Function - Air cabin crew transfer assistive device: Drop arm commode Assist level to toilet: Total assist (Pt < 25%) Assist level from toilet: Total assist (Pt < 25%)  Function - Chair/bed transfer Chair/bed transfer method: Squat pivot Chair/bed transfer assist level: Total assist (Pt < 25%) Chair/bed transfer assistive device: Bedrails, Armrests Chair/bed transfer details: Verbal cues for precautions/safety, Verbal cues for safe use of DME/AE, Tactile cues for placement, Manual facilitation for placement, Manual facilitation for weight shifting  Function - Locomotion: Wheelchair Will patient use wheelchair at discharge?: Yes Type: Manual Max wheelchair distance: 150 Assist Level: Dependent (Pt equals 0%) Assist Level: Dependent (Pt equals 0%) Assist Level: Dependent (Pt equals 0%) Turns around,maneuvers to table,bed, and toilet,negotiates 3% grade,maneuvers on rugs and over doorsills: No Function - Locomotion: Ambulation Assistive device: Rail in hallway Max distance: 10 Assist level: 2 helpers (total for gait, +2 for w/c follow) Assist level: 2 helpers Walk 50 feet with 2 turns activity did not occur: Safety/medical concerns Walk 150 feet activity did not occur: Safety/medical concerns Walk 10 feet on uneven surfaces activity did not occur: Safety/medical concerns  Function - Comprehension Comprehension: Auditory Comprehension assist level: Understands basic 25 - 49% of the time/  requires cueing 50 - 75% of the time  Function - Expression Expression: Verbal Expression assist level: Expresses basis less than 25% of the time/requires cueing >75% of the time.  Function - Social Interaction Social Interaction assist level: Interacts appropriately 50 - 74% of the time - May be physically or verbally inappropriate.  Function - Problem Solving Problem solving assist level: Solves basic 25 - 49% of the time - needs direction more than half the time to initiate, plan or complete simple activities  Function - Memory Memory assist level: Recognizes or recalls less than 25% of the time/requires cueing greater than 75% of the time Patient normally able to recall (first 3 days only): That he or she is in a hospital  Medical Problem List and Plan: 1.  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  DVT Prophylaxis/Anticoagulation: Pharmaceutical: Lovenox,  Vascular study with  small right baker's cyst, otherwise unremarkable 3. Pain Management: Added Sportscreme for back pain.  4. Mood: LCSW to follow for evaluation and support.  5. Neuropsych: This patient is not capable of making decisions on her own behalf. 6. Skin/Wound Care: pressure relief measures. 7. Fluids/Electrolytes/Nutrition: Monitor I/O.  8. HTN: monitor BP bid--continue HCTZ.   Controlled 4/7 9. Hypokalemia: added supplement.  K+ 3.8 on 4/6 10. ESBL E coli UTI: Changed to septra DS D# 3 11. NSTEMI: Will need follow up NUC study in the future.  12. T2DM with peripheral neuropathy: Monitor BS ac/hs. Hgb A1c- 10.3-poorly controlled. Continue metformin 1051m BID and add Amaryl 179m Question ability to manage insulin especially if she has to go back to [place]  Labile at present   LOS (Days) 2 A FACE TO FACE EVALUATION WAS PERFORMED  Paige Stewart AnLorie Phenix/08/2016, 7:56 PM

## 2016-05-27 NOTE — Progress Notes (Signed)
Speech Language Pathology Daily Session Note  Patient Details  Name: Paige Stewart MRN: [id number] Date of Birth: [birth year]  Today's Date: 05/27/2016 SLP Individual Time: 1300-1330 SLP Individual Time Calculation (min): 30 min  Short Term Goals: Week 1: SLP Short Term Goal 1 (Week 1): Patient will consume current diet with minimal overt s/s of aspiration with Mod A verbal cues needed for use of swallowing compensatory strategies.  SLP Short Term Goal 2 (Week 1): Patient will utilize chin tuck with trials of thin liquids without overt s/s of aspiration in 75% of trials with Mod A verbal cues over 3 sessions prior to upgrade.  SLP Short Term Goal 3 (Week 1): Patient will utilize an increased vocal intensity at the word and phrase level to achieve ~75% intelligibility with Mod A verbal cues.  SLP Short Term Goal 4 (Week 1): Patient will name functional items with 75% accuracy and Min A verbal cues.  SLP Short Term Goal 5 (Week 1): Patient will attend to right field of enviornment during functional tasks with Max A multimodal cues. SLP Short Term Goal 6 (Week 1): Patient will initiate functional tasks with Mod A verbal and visual cues.   Skilled Therapeutic Interventions: Skilled treatment session focused on language goals. SLP facilitated session by presenting 20 common objects. Pt able to name with 100% accuracy and 100% speech intelligibility in quiet environment. Pt able to increase vocal intensity with Max A Multimodal cues (visual demonstration to take deep breath before speaking was helpful). No family present during session. Pt was left upright in bed, bed alarm on and all needs within reach. Continue per current plan of care.       Function:    Cognition Comprehension Comprehension assist level: Understands basic 25 - 49% of the time/ requires cueing 50 - 75% of the time  Expression   Expression assist level: Expresses basis less than 25% of the time/requires cueing >75% of the time.   Social Interaction Social Interaction assist level: Interacts appropriately 50 - 74% of the time - May be physically or verbally inappropriate.  Problem Solving Problem solving assist level: Solves basic 25 - 49% of the time - needs direction more than half the time to initiate, plan or complete simple activities  Memory Memory assist level: Recognizes or recalls less than 25% of the time/requires cueing greater than 75% of the time    Pain    Therapy/Group: Individual Therapy   Grete Bosko B. Dreama Saa, [name], [name]   Kellianne Ek 05/27/2016, 2:51 PM

## 2016-05-27 NOTE — Progress Notes (Signed)
Occupational Therapy Session Note  Patient Details  Name: Paige Stewart MRN: [id number] Date of Birth: 1950-06-25  Today's Date: 05/27/2016 OT Individual Time: 5784-6962 and 9528-4132 OT Individual Time Calculation (min): 61 min and 62 min   Short Term Goals: Week 1:  OT Short Term Goal 1 (Week 1): Pt will complete toilet transfer with Max A +1 OT Short Term Goal 2 (Week 1): Pt will complete UB dressing using hemi techniques with Mod A OT Short Term Goal 3 (Week 1): Pt will maintain sitting balance during functional task seated EOB with min A  Skilled Therapeutic Interventions/Progress Updates: Skilled OT session completed with focus on adaptive bathing/dressing skills, R UE NMR, sitting balance, and R attention. Pt was lying in bed at time of arrival, reporting pain in R UE/LE. RN retrieved to administer pain meds. Afterwards pt transitioned from supine<sit with assist for guiding LEs off of bed and righting trunk. Bathing completed at EOB with setup, pt requiring Mod A for balance due to core weakness (Min A with unilateral support on bedrail).Pt able to follow 1 step instruction and attend to right side of body with min cuing. Max A for donning shirt due to combined balance demands of task. She was then returned to supine for pericare/LB dressing. Rolling L<R completed with Min A with instruction on hand placement with OT protecting R UE. At end of session, pt repositioned to protect hemiplegic side. She was left with all needs and bed alarm activated at time of departure.      2nd Session 1:1 tx (62 min) Pt was lying in bed at time of arrival, agreeable to session. Squat pivot<w/c completed with Total A and multimodal cues on technique while blocking R knee. Once in w/c, she engaged in grooming/oral care tasks at sink with manual facilitation of R UE as gross stabilizer. Pt leaned forward to expectorate into sink and soiled front side of her shirt. Had her don new shirt. With extra time and  instruction, pt able to complete with Mod A. Afterwards, had her practice sit<stand at sink for R side weightbearing. Sit<stand x2 trials with Mod-Max A. R knee blocked, with LE sliding into abduction over time. R UE weightbearing on sink for NMR. Had her practice lifting L UE off of sink and transport items from one side of sink to the other. Pt requiring Mod A for balance maintenance during dynamic standing. At end of session pt was left in w/c with daughter present and all needs.    Therapy Documentation Precautions:  Precautions Precautions: Fall Precaution Comments: dense R hemiparesis Restrictions Weight Bearing Restrictions: No   Pain: RN provided medication with c/o pain during sessions   ADL: ADL ADL Comments: Please see functional navigator    See Function Navigator for Current Functional Status.   Therapy/Group: Individual Therapy  Rae Plotner A Vilda Zollner 05/27/2016, 12:28 PM

## 2016-05-27 NOTE — Progress Notes (Signed)
Physical Therapy Session Note  Patient Details  Name: Paige Stewart MRN: [id number] Date of Birth: November 10, 1950  Today's Date: 05/27/2016 PT Individual Time: 1020-1105 PT Individual Time Calculation (min): 45 min   Short Term Goals: Week 1:  PT Short Term Goal 1 (Week 1): Pt will transfer with max assist and LRAD PT Short Term Goal 2 (Week 1): Pt will ambulate 30' with max assist for gait, +2 for w/c follow PT Short Term Goal 3 (Week 1): Pt will demonstrate static sitting balance without back support x5 minutes with supervision PT Short Term Goal 4 (Week 1): Pt will initiate stair training with PT  Skilled Therapeutic Interventions/Progress Updates:   Pt received supine in bed and agreeable to PT. Supine>sit transfer with max assist and max cues for sequencing. Mod assist for sitting Balance EOB and 1 UE support.   Squat pivot transfer training x 4 throughout treatment with max-total assist. PT required to provide instruction for sequencing and for PT to block the RLE to prevent LOB to the R.   Bed mobility including supine<>sit with max assist from PT to control the LLE and manage trunk.   Supine NMR. Bridges, Rolling L and R x 4 with mod assist each, Flexion/extension in gravity eleminated position. Hip adduction/adduction with AAROM from PT. Sitting balance without UE support with mod-max assist from PT x 5 minutes  Pt returned to room and performed squat pivot transfer to bed with max-total assist from PT. Sit>supine completed with max assist and pt left supine in bed with call bell in reach and all needs met.          Therapy Documentation Precautions:  Precautions Precautions: Fall Precaution Comments: dense R hemiparesis Restrictions Weight Bearing Restrictions: No Pain: 0/10   See Function Navigator for Current Functional Status.   Therapy/Group: Individual Therapy  Lorie Phenix 05/27/2016, 12:56 PM

## 2016-05-28 ENCOUNTER — Inpatient Hospital Stay (HOSPITAL_COMMUNITY): Payer: No Typology Code available for payment source | Admitting: Occupational Therapy

## 2016-05-28 DIAGNOSIS — E1165 Type 2 diabetes mellitus with hyperglycemia: Secondary | ICD-10-CM

## 2016-05-28 DIAGNOSIS — E1142 Type 2 diabetes mellitus with diabetic polyneuropathy: Secondary | ICD-10-CM

## 2016-05-28 MED ORDER — FLUOXETINE HCL 10 MG PO CAPS
10.0000 mg | ORAL_CAPSULE | Freq: Every day | ORAL | Status: DC
  Administered 2016-05-28 – 2016-06-16 (×19): 10 mg via ORAL
  Filled 2016-05-28 (×20): qty 1

## 2016-05-28 NOTE — Progress Notes (Signed)
Orthopedic Tech Progress Note Patient Details:  [name] 14-Aug-1950 [id number]  Patient ID: Paige Stewart, female   DOB: April 28, 1950, 66 y.o.   MRN: [id number]   Paige Stewart 05/28/2016, 9:19 AMCalled [facility] for right resting hand splint,  Right Profo boot, and right cockup splint.

## 2016-05-28 NOTE — Progress Notes (Signed)
Subjective/Complaints: Patient seen lying in bed this morning. She slept well overnight. Denies complaints.  ROS: Denies CP, SOB, N/V/D.  Objective: Vital Signs: Blood pressure 123/63, pulse 83, temperature 98.7 F (37.1 C), temperature source Oral, resp. rate 18, height _0  (1.499 m), weight 67.8 kg (149 lb 7.6 oz), SpO2 97 %. No results found. Results for orders placed or performed during the hospital encounter of 05/25/16 (from the past 72 hour(s))  Glucose, capillary     Status: Abnormal   Collection Time: 05/25/16  9:18 PM  Result Value Ref Range   Glucose-Capillary 188 (H) 65 - 99 mg/dL  Glucose, capillary     Status: Abnormal   Collection Time: 05/26/16  6:38 AM  Result Value Ref Range   Glucose-Capillary 103 (H) 65 - 99 mg/dL  CBC WITH DIFFERENTIAL     Status: Abnormal   Collection Time: 05/26/16  7:00 AM  Result Value Ref Range   WBC 5.1 4.0 - 10.5 K/uL   RBC 3.85 (L) 3.87 - 5.11 MIL/uL   Hemoglobin 11.7 (L) 12.0 - 15.0 g/dL   HCT 33.6 (L) 36.0 - 46.0 %   MCV 87.3 78.0 - 100.0 fL   MCH 30.4 26.0 - 34.0 pg   MCHC 34.8 30.0 - 36.0 g/dL   RDW 13.6 11.5 - 15.5 %   Platelets 256 150 - 400 K/uL   Neutrophils Relative % 56 %   Neutro Abs 2.9 1.7 - 7.7 K/uL   Lymphocytes Relative 35 %   Lymphs Abs 1.8 0.7 - 4.0 K/uL   Monocytes Relative 7 %   Monocytes Absolute 0.4 0.1 - 1.0 K/uL   Eosinophils Relative 2 %   Eosinophils Absolute 0.1 0.0 - 0.7 K/uL   Basophils Relative 0 %   Basophils Absolute 0.0 0.0 - 0.1 K/uL  Comprehensive metabolic panel     Status: Abnormal   Collection Time: 05/26/16  7:00 AM  Result Value Ref Range   Sodium 139 135 - 145 mmol/L   Potassium 3.8 3.5 - 5.1 mmol/L   Chloride 107 101 - 111 mmol/L   CO2 24 22 - 32 mmol/L   Glucose, Bld 187 (H) 65 - 99 mg/dL   BUN 15 6 - 20 mg/dL   Creatinine, Ser 1.05 (H) 0.44 - 1.00 mg/dL   Calcium 9.2 8.9 - 10.3 mg/dL   Total Protein 6.0 (L) 6.5 - 8.1 g/dL   Albumin 2.9 (L) 3.5 - 5.0 g/dL   AST 17 15 -  41 U/L   ALT 18 14 - 54 U/L   Alkaline Phosphatase 43 38 - 126 U/L   Total Bilirubin 0.7 0.3 - 1.2 mg/dL   GFR calc non Af Amer 54 (L) >60 mL/min   GFR calc Af Amer >60 >60 mL/min    Comment: (NOTE) The eGFR has been calculated using the CKD EPI equation. This calculation has not been validated in all clinical situations. eGFR's persistently <60 mL/min signify possible Chronic Kidney Disease.    Anion gap 8 5 - 15  Glucose, capillary     Status: Abnormal   Collection Time: 05/26/16 12:02 PM  Result Value Ref Range   Glucose-Capillary 172 (H) 65 - 99 mg/dL     HEENT: Normocephalic, atraumatic Cardio: RRR. No JVD. Resp: CTA B/L and unlaboured GI: BS positive and ND Skin:   Intact. Warm and dry. Neuro: Flat,  Motor 0 /5 in RUE and RLE Musc/Skel:  No edema. No tenderness. Gen NAD. Vital signs reviewed.  Assessment/Plan: 1. Functional deficits secondary to Basal ganglia infarct which require 3+ hours per day of interdisciplinary therapy in a comprehensive inpatient rehab setting. Physiatrist is providing close team supervision and 24 hour management of active medical problems listed below. Physiatrist and rehab team continue to assess barriers to discharge/monitor patient progress toward functional and medical goals. FIM: Function - Bathing Position: Sitting EOB (UB EOB and LB bedlevel) Body parts bathed by patient: Right arm, Chest, Abdomen, Front perineal area, Right upper leg, Left upper leg Body parts bathed by helper: Buttocks, Right lower leg, Left lower leg, Back, Left arm Assist Level:  (Mod A)  Function- Upper Body Dressing/Undressing What is the patient wearing?: Pull over shirt/dress Pull over shirt/dress - Perfomed by patient: Thread/unthread left sleeve, Put head through opening Pull over shirt/dress - Perfomed by helper: Thread/unthread right sleeve, Pull shirt over trunk Button up shirt - Perfomed by helper: Thread/unthread right sleeve, Button/unbutton shirt,  Pull shirt around back, Thread/unthread left sleeve Assist Level:  (Mod A) Function - Lower Body Dressing/Undressing What is the patient wearing?: Pants, Non-skid slipper socks Position: Bed Pants- Performed by helper: Thread/unthread right pants leg, Thread/unthread left pants leg, Pull pants up/down Non-skid slipper socks- Performed by helper: Don/doff right sock, Don/doff left sock Assist for footwear: Dependant Assist for lower body dressing:  (Total A)  Function - Toileting Toileting activity did not occur: No continent bowel/bladder event Toileting steps completed by helper: Adjust clothing prior to toileting, Performs perineal hygiene, Adjust clothing after toileting Assist level: Two helpers  Function - Air cabin crew transfer assistive device: Drop arm commode Assist level to toilet: Total assist (Pt < 25%) Assist level from toilet: Total assist (Pt < 25%)  Function - Chair/bed transfer Chair/bed transfer method: Squat pivot Chair/bed transfer assist level: Total assist (Pt < 25%) Chair/bed transfer assistive device: Bedrails, Armrests Chair/bed transfer details: Verbal cues for precautions/safety, Verbal cues for safe use of DME/AE, Tactile cues for placement, Manual facilitation for placement, Manual facilitation for weight shifting  Function - Locomotion: Wheelchair Will patient use wheelchair at discharge?: Yes Type: Manual Max wheelchair distance: 150 Assist Level: Dependent (Pt equals 0%) Assist Level: Dependent (Pt equals 0%) Assist Level: Dependent (Pt equals 0%) Turns around,maneuvers to table,bed, and toilet,negotiates 3% grade,maneuvers on rugs and over doorsills: No Function - Locomotion: Ambulation Assistive device: Rail in hallway Max distance: 10 Assist level: 2 helpers (total for gait, +2 for w/c follow) Assist level: 2 helpers Walk 50 feet with 2 turns activity did not occur: Safety/medical concerns Walk 150 feet activity did not occur:  Safety/medical concerns Walk 10 feet on uneven surfaces activity did not occur: Safety/medical concerns  Function - Comprehension Comprehension: Auditory Comprehension assist level: Understands basic 25 - 49% of the time/ requires cueing 50 - 75% of the time  Function - Expression Expression: Verbal Expression assist level: Expresses basis less than 25% of the time/requires cueing >75% of the time.  Function - Social Interaction Social Interaction assist level: Interacts appropriately 50 - 74% of the time - May be physically or verbally inappropriate.  Function - Problem Solving Problem solving assist level: Solves basic 25 - 49% of the time - needs direction more than half the time to initiate, plan or complete simple activities  Function - Memory Memory assist level: Recognizes or recalls less than 25% of the time/requires cueing greater than 75% of the time Patient normally able to recall (first 3 days only): That he or she is in a hospital  Medical  Problem List and Plan: 1.  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  DVT Prophylaxis/Anticoagulation: Pharmaceutical: Lovenox,  Vascular study with small right baker's cyst, otherwise unremarkable 3. Pain Management: Added Sportscreme for back pain.  4. Mood: LCSW to follow for evaluation and support.  5. Neuropsych: This patient is not capable of making decisions on her own behalf. 6. Skin/Wound Care: pressure relief measures. 7. Fluids/Electrolytes/Nutrition: Monitor I/O.  8. HTN: monitor BP bid--continue HCTZ.   Controlled 4/8 9. Hypokalemia: added supplement.  K+ 3.8 on 4/6 10. ESBL E coli UTI: Changed to septra DS D# 4 11. NSTEMI: Will need follow up NUC study in the future.  12. T2DM with peripheral neuropathy: Monitor BS ac/hs. Hgb A1c- 10.3-poorly controlled. Continue metformin 1059m BID and add Amaryl 144m Question ability to manage  insulin especially if she has to go back to [place]  Most recent CBGs labile   LOS (Days) 3 A FACE TO FACE EVALUATION WAS PERFORMED  Tahiri Shareef AnLorie Phenix/09/2016, 8:48 AM

## 2016-05-28 NOTE — Progress Notes (Signed)
Occupational Therapy Session Note  Patient Details  Name: Paige Stewart MRN: [id number] Date of Birth: 07/17/1950  Today's Date: 05/28/2016 OT Individual Time: 1004-1100 OT Individual Time Calculation (min): 56 min    Short Term Goals: Week 1:  OT Short Term Goal 1 (Week 1): Pt will complete toilet transfer with Max A +1 OT Short Term Goal 2 (Week 1): Pt will complete UB dressing using hemi techniques with Mod A OT Short Term Goal 3 (Week 1): Pt will maintain sitting balance during functional task seated EOB with min A  Skilled Therapeutic Interventions/Progress Updates: Skilled OT session completed with focus on adaptive bathing/dressing skills, functional transfers, R UE NMR and sit<stands. Pt was lying in bed at time of arrival, max encouragement to get OOB. Squat pivot transfer from EOB<w/c completed with Total A and R side protected/blocked. Bathing/dressing completed w/c level at sink. Sit<stand completed with Max A with cues for anterior weight shift and hand placement. Pt with very poor trunk control when standing and required manual cues to improve. R UE WB on sink facilitated by OT. Figure 4 position for washing feet, OT assisted with positioning R LE. Continued education on hemi technique for dressing, Max A for button-up shirt. Facilitated R UE as gross stabilizer during all grooming tasks.  Afterwards pt was left in w/c with all needs within reach.      Therapy Documentation Precautions:  Precautions Precautions: Fall Precaution Comments: dense R hemiparesis Restrictions Weight Bearing Restrictions: No   Vital Signs: Therapy Vitals Pulse Rate: 92 BP: 125/71 Pain: No c/o pain during session  Pain Assessment Pain Score: 0-No pain Faces Pain Scale: No hurt ADL: ADL ADL Comments: Please see functional navigator    See Function Navigator for Current Functional Status.   Therapy/Group: Individual Therapy  Eldean Klatt A Trentyn Boisclair 05/28/2016, 12:44 PM

## 2016-05-29 ENCOUNTER — Inpatient Hospital Stay (HOSPITAL_COMMUNITY): Payer: No Typology Code available for payment source | Admitting: Physical Therapy

## 2016-05-29 ENCOUNTER — Inpatient Hospital Stay (HOSPITAL_COMMUNITY): Payer: No Typology Code available for payment source | Admitting: Speech Pathology

## 2016-05-29 ENCOUNTER — Inpatient Hospital Stay (HOSPITAL_COMMUNITY): Payer: No Typology Code available for payment source

## 2016-05-29 ENCOUNTER — Inpatient Hospital Stay (HOSPITAL_COMMUNITY): Payer: No Typology Code available for payment source | Admitting: Occupational Therapy

## 2016-05-29 LAB — GLUCOSE, CAPILLARY
GLUCOSE-CAPILLARY: 75 mg/dL (ref 65–99)
GLUCOSE-CAPILLARY: 142 mg/dL — AB (ref 65–99)
GLUCOSE-CAPILLARY: 139 mg/dL — AB (ref 65–99)
GLUCOSE-CAPILLARY: 121 mg/dL — AB (ref 65–99)
GLUCOSE-CAPILLARY: 111 mg/dL — AB (ref 65–99)
Glucose-Capillary: 92 mg/dL (ref 65–99)
Glucose-Capillary: 115 mg/dL — ABNORMAL HIGH (ref 65–99)
Glucose-Capillary: 120 mg/dL — ABNORMAL HIGH (ref 65–99)
Glucose-Capillary: 98 mg/dL (ref 65–99)
Glucose-Capillary: 143 mg/dL — ABNORMAL HIGH (ref 65–99)
Glucose-Capillary: 103 mg/dL — ABNORMAL HIGH (ref 65–99)
Glucose-Capillary: 119 mg/dL — ABNORMAL HIGH (ref 65–99)

## 2016-05-29 MED ORDER — PRO-STAT SUGAR FREE PO LIQD
30.0000 mL | Freq: Two times a day (BID) | ORAL | Status: DC
  Administered 2016-05-29 – 2016-06-16 (×25): 30 mL via ORAL
  Filled 2016-05-29 (×34): qty 30

## 2016-05-29 NOTE — Progress Notes (Signed)
Subjective/Complaints: No issue overnite, she is concerned about R arm not moving, we discussed location of stroke as well as possibility of further recovery  ROS: Denies CP, SOB, N/V/D.  Objective: Vital Signs: Blood pressure 122/87, pulse 86, temperature 98.5 F (36.9 C), temperature source Oral, resp. rate 17, height  (1.499 m), weight 66 kg (145 lb 8.1 oz), SpO2 95 %. No results found. Results for orders placed or performed during the hospital encounter of 05/25/16 (from the past 72 hour(s))  Glucose, capillary     Status: Abnormal   Collection Time: 05/26/16 12:02 PM  Result Value Ref Range   Glucose-Capillary 172 (H) 65 - 99 mg/dL     HEENT: Normocephalic, atraumatic Cardio: RRR. No JVD. Resp: CTA B/L and unlaboured GI: BS positive and ND Skin:   Intact. Warm and dry. Neuro: Flat,  Motor 0 /5 in RUE and RLE Musc/Skel:  No edema. No tenderness. Gen NAD. Vital signs reviewed.   Assessment/Plan: 1. Functional deficits secondary to Basal ganglia infarct which require 3+ hours per day of interdisciplinary therapy in a comprehensive inpatient rehab setting. Physiatrist is providing close team supervision and 24 hour management of active medical problems listed below. Physiatrist and rehab team continue to assess barriers to discharge/monitor patient progress toward functional and medical goals. FIM: Function - Bathing Position: Wheelchair/chair at sink Body parts bathed by patient: Right arm, Chest, Abdomen, Front perineal area, Right upper leg, Left upper leg, Left lower leg Body parts bathed by helper: Left arm, Buttocks, Right lower leg, Back Assist Level:  (Mod A)  Function- Upper Body Dressing/Undressing What is the patient wearing?: Button up shirt Pull over shirt/dress - Perfomed by patient: Thread/unthread left sleeve, Put head through opening Pull over shirt/dress - Perfomed by helper: Thread/unthread right sleeve, Pull shirt over trunk Button up shirt -  Perfomed by patient: Thread/unthread right sleeve Button up shirt - Perfomed by helper: Thread/unthread left sleeve, Button/unbutton shirt, Pull shirt around back Assist Level:  (Max A) Function - Lower Body Dressing/Undressing What is the patient wearing?: Pants, Non-skid slipper socks Position: Wheelchair/chair at sink Pants- Performed by helper: Thread/unthread right pants leg, Thread/unthread left pants leg, Pull pants up/down Non-skid slipper socks- Performed by helper: Don/doff right sock, Don/doff left sock Assist for footwear: Dependant Assist for lower body dressing:  (Total A)  Function - Toileting Toileting activity did not occur: No continent bowel/bladder event Toileting steps completed by helper: Adjust clothing prior to toileting, Performs perineal hygiene, Adjust clothing after toileting Assist level: Two helpers  Function - Archivist transfer assistive device: Drop arm commode Assist level to toilet: Total assist (Pt < 25%) Assist level from toilet: Total assist (Pt < 25%)  Function - Chair/bed transfer Chair/bed transfer method: Squat pivot Chair/bed transfer assist level: Total assist (Pt < 25%) Chair/bed transfer assistive device: Bedrails, Armrests Chair/bed transfer details: Verbal cues for precautions/safety, Verbal cues for safe use of DME/AE, Tactile cues for placement, Manual facilitation for placement, Manual facilitation for weight shifting  Function - Locomotion: Wheelchair Will patient use wheelchair at discharge?: Yes Type: Manual Max wheelchair distance: 150 Assist Level: Dependent (Pt equals 0%) Assist Level: Dependent (Pt equals 0%) Assist Level: Dependent (Pt equals 0%) Turns around,maneuvers to table,bed, and toilet,negotiates 3% grade,maneuvers on rugs and over doorsills: No Function - Locomotion: Ambulation Assistive device: Rail in hallway Max distance: 10 Assist level: 2 helpers (total for gait, +2 for w/c follow) Assist  level: 2 helpers Walk 50 feet with 2 turns  activity did not occur: Safety/medical concerns Walk 150 feet activity did not occur: Safety/medical concerns Walk 10 feet on uneven surfaces activity did not occur: Safety/medical concerns  Function - Comprehension Comprehension: Auditory Comprehension assist level: Understands basic 25 - 49% of the time/ requires cueing 50 - 75% of the time  Function - Expression Expression: Verbal Expression assist level: Expresses basis less than 25% of the time/requires cueing >75% of the time.  Function - Social Interaction Social Interaction assist level: Interacts appropriately 50 - 74% of the time - May be physically or verbally inappropriate.  Function - Problem Solving Problem solving assist level: Solves basic 25 - 49% of the time - needs direction more than half the time to initiate, plan or complete simple activities  Function - Memory Memory assist level: Recognizes or recalls less than 25% of the time/requires cueing greater than 75% of the time Patient normally able to recall (first 3 days only): That he or she is in a hospital  Medical Problem List and Plan: 1.  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  DVT Prophylaxis/Anticoagulation: Pharmaceutical: Lovenox,  Vascular study with small right baker's cyst, otherwise unremarkable 3. Pain Management: Added Sportscreme for back pain.  4. Mood: LCSW to follow for evaluation and support.  5. Neuropsych: This patient is not capable of making decisions on her own behalf. 6. Skin/Wound Care: pressure relief measures. 7. Fluids/Electrolytes/Nutrition: Monitor I/O.  8. HTN: monitor BP bid--continue HCTZ.   Controlled 4/9 9. Hypokalemia: added supplement.  K+ 3.8 on 4/6 10. ESBL E coli UTI: Changed to septra DS D# 5 11. NSTEMI: Will need follow up NUC study in the future.  12. T2DM with peripheral neuropathy: Monitor BS  ac/hs. Hgb A1c- 10.3-poorly controlled. Continue metformin  BID and add Amaryl . Question ability to manage insulin especially if she has to go back to [place].  111 per RN this am, not transferring data to [facility], informed RN  CBG (last 3)   Recent Labs  05/26/16 1202  GLUCAP 172*      LOS (Days) 4 A FACE TO FACE EVALUATION WAS PERFORMED  Xavia Kniskern E 05/29/2016, 8:19 AM

## 2016-05-29 NOTE — Progress Notes (Signed)
Physical Therapy Note  Patient Details  Name: Paige Stewart MRN: [id number] Date of Birth: 09-Jan-1951 Today's Date: 05/29/2016    Pt received in bed reporting she vomited earlier and still was feeling ill. Pt declined participation in therapy treatment. Pt missed 30 minutes of skilled PT treatment. Will f/u per POC.   Sandi Mariscal 05/29/2016, 4:53 PM

## 2016-05-29 NOTE — Progress Notes (Signed)
Occupational Therapy Session Note  Patient Details  Name: Paige Stewart MRN: [id number] Date of Birth: 06/26/1950  Today's Date: 05/29/2016 OT Individual Time: 0930-1030 OT Individual Time Calculation (min): 60 min    Short Term Goals: Week 1:  OT Short Term Goal 1 (Week 1): Pt will complete toilet transfer with Max A +1 OT Short Term Goal 2 (Week 1): Pt will complete UB dressing using hemi techniques with Mod A OT Short Term Goal 3 (Week 1): Pt will maintain sitting balance during functional task seated EOB with min A  Skilled Therapeutic Interventions/Progress Updates:      Pt seen for BADL retraining of toileting, bathing, and dressing with a focus on R side awareness, postural control, functional mobility. Pt received in bed and agreeable to ADL training.  Sat to EOB with mod A. Squat pivot to L with total A to fully wt shift forward and complete pivot into seat. At sink, min A for pt to move into upright sitting from leaning on back rest to brush teeth.  At times, hand over hand guiding as pt had a tendency to perseverate on some body parts or not thoroughly cleanse other areas she could reach with her L hand.  Mod cues to fully attend to R side.  To change wet brief, wash bottom, don new brief and then new pants, pt had to sit>< stand 4x from w/c to sink. She had great difficulty following directions to push with L hand from arm rest to then reach for sink.  Pt did much better when given a "1, 2" cue - 1 hand on arm rest, 2 hand on sink. Then pt was able to come into a stand with mod A, maintained balance with max A and then total A for cleansing and clean up.  Pt adjusted back in w/c and provided with a R arm lap tray. Pt taken back to room with quick release belt on. Pt given call light and nodded that she knew how to press the button.  RN informed pt was in the room and will trial pt being in room without supervision.    Therapy Documentation Precautions:  Precautions Precautions:  Fall Precaution Comments: dense R hemiparesis Restrictions Weight Bearing Restrictions: No    Pain: Pain Assessment Pain Assessment: No/denies pain ADL: ADL ADL Comments: Please see functional navigator  See Function Navigator for Current Functional Status.   Therapy/Group: Individual Therapy  SAGUIER,JULIA 05/29/2016, 12:37 PM

## 2016-05-29 NOTE — Progress Notes (Signed)
Physical Therapy Session Note  Patient Details  Name: Paige Stewart MRN: [id number] Date of Birth: 18-Aug-1950  Today's Date: 05/29/2016 PT Individual Time: 1415-1445 PT Individual Time Calculation (min): 30 min   Short Term Goals: Week 1:  PT Short Term Goal 1 (Week 1): Pt will transfer with max assist and LRAD PT Short Term Goal 2 (Week 1): Pt will ambulate 30' with max assist for gait, +2 for w/c follow PT Short Term Goal 3 (Week 1): Pt will demonstrate static sitting balance without back support x5 minutes with supervision PT Short Term Goal 4 (Week 1): Pt will initiate stair training with PT  Skilled Therapeutic Interventions/Progress Updates:    no c/o pain, appears more alert compared to SLP session and per dtrs report.  Pt transitions supine>sit with HOB elevated and max assist.  Able to maintain sitting balance EOB with min assist and LUE support.  Squat/pivot to w/c on pt's L with mod assist, pt demonstrating improved power up compared to previous sessions.  Once in w/c PT propelled w/c total assist towards therapy gym.  Pt stopped to talk to PA, and became ill, vomitting multiple times.  Pt returned to room, repeated sit<>squat with max assist for doffing soiled clothing/brief, hygiene, and stand/pivot back to recliner.  Pt assists with 0% of transfer back to recliner.  Unsure whether due to fatigue, illness, or other reason.  Pt positioned in recliner with family present, PA and RN aware of pt status. Missed 45 minutes of skilled PT.  Therapy Documentation Precautions:  Precautions Precautions: Fall Precaution Comments: dense R hemiparesis Restrictions Weight Bearing Restrictions: No General: PT Amount of Missed Time (min): 45 Minutes PT Missed Treatment Reason: Patient ill (Comment)   See Function Navigator for Current Functional Status.   Therapy/Group: Individual Therapy  Ladora Daniel Penven-Crew 05/29/2016, 3:51 PM

## 2016-05-29 NOTE — Progress Notes (Signed)
Speech Language Pathology Daily Session Note  Patient Details  Name: Paige Stewart MRN: [id number] Date of Birth: August 15, 1950  Today's Date: 05/29/2016 SLP Individual Time: 1330-1400 SLP Individual Time Calculation (min): 30 min  Short Term Goals: Week 1: SLP Short Term Goal 1 (Week 1): Patient will consume current diet with minimal overt s/s of aspiration with Mod A verbal cues needed for use of swallowing compensatory strategies.  SLP Short Term Goal 2 (Week 1): Patient will utilize chin tuck with trials of thin liquids without overt s/s of aspiration in 75% of trials with Mod A verbal cues over 3 sessions prior to upgrade.  SLP Short Term Goal 3 (Week 1): Patient will utilize an increased vocal intensity at the word and phrase level to achieve ~75% intelligibility with Mod A verbal cues.  SLP Short Term Goal 4 (Week 1): Patient will name functional items with 75% accuracy and Min A verbal cues.  SLP Short Term Goal 5 (Week 1): Patient will attend to right field of enviornment during functional tasks with Max A multimodal cues. SLP Short Term Goal 6 (Week 1): Patient will initiate functional tasks with Mod A verbal and visual cues.   Skilled Therapeutic Interventions: Skilled treatment session focused on cognitive goals. Upon arrival, patient was asleep in bed and required extra time and Max verbal and tactile cues for arousal. Patient appeared disoriented and required more than a reasonable amount of time to respond to questions from the clincian. When patient did respond, she was essentially unintelligible due to a low vocal intensity and minimal oral movement. RN aware. Patient left sitting upright in bed with daughter present. Continue with current plan of care.      Function:  Cognition Comprehension Comprehension assist level: Understands basic 25 - 49% of the time/ requires cueing 50 - 75% of the time  Expression   Expression assist level: Expresses basis less than 25% of the  time/requires cueing >75% of the time.  Social Interaction Social Interaction assist level: Interacts appropriately 50 - 74% of the time - May be physically or verbally inappropriate.  Problem Solving Problem solving assist level: Solves basic 25 - 49% of the time - needs direction more than half the time to initiate, plan or complete simple activities  Memory Memory assist level: Recognizes or recalls less than 25% of the time/requires cueing greater than 75% of the time    Pain Pain Assessment Pain Assessment: No/denies pain  Therapy/Group: Individual Therapy  Paige Stewart 05/29/2016, 3:48 PM

## 2016-05-30 ENCOUNTER — Inpatient Hospital Stay (HOSPITAL_COMMUNITY): Payer: No Typology Code available for payment source | Admitting: Speech Pathology

## 2016-05-30 ENCOUNTER — Inpatient Hospital Stay (HOSPITAL_COMMUNITY): Payer: No Typology Code available for payment source

## 2016-05-30 ENCOUNTER — Inpatient Hospital Stay (HOSPITAL_COMMUNITY): Payer: No Typology Code available for payment source | Admitting: Physical Therapy

## 2016-05-30 LAB — CBC
HCT: 34.5 % — ABNORMAL LOW (ref 36.0–46.0)
HEMOGLOBIN: 11.5 g/dL — AB (ref 12.0–15.0)
MCH: 29.4 pg (ref 26.0–34.0)
MCHC: 33.3 g/dL (ref 30.0–36.0)
MCV: 88.2 fL (ref 78.0–100.0)
PLATELETS: 300 10*3/uL (ref 150–400)
RBC: 3.91 MIL/uL (ref 3.87–5.11)
RDW: 13.2 % (ref 11.5–15.5)
WBC: 4.7 10*3/uL (ref 4.0–10.5)

## 2016-05-30 LAB — BASIC METABOLIC PANEL
ANION GAP: 7 (ref 5–15)
BUN: 49 mg/dL — ABNORMAL HIGH (ref 6–20)
CHLORIDE: 104 mmol/L (ref 101–111)
CO2: 24 mmol/L (ref 22–32)
CREATININE: 2.04 mg/dL — AB (ref 0.44–1.00)
Calcium: 9.4 mg/dL (ref 8.9–10.3)
GFR calc non Af Amer: 24 mL/min — ABNORMAL LOW (ref >60)
GFR, EST AFRICAN AMERICAN: 28 mL/min — AB (ref >60)
Glucose, Bld: 130 mg/dL — ABNORMAL HIGH (ref 65–99)
Potassium: 4.8 mmol/L (ref 3.5–5.1)
SODIUM: 135 mmol/L (ref 135–145)

## 2016-05-30 LAB — GLUCOSE, CAPILLARY
GLUCOSE-CAPILLARY: 94 mg/dL (ref 65–99)
GLUCOSE-CAPILLARY: 126 mg/dL — AB (ref 65–99)
GLUCOSE-CAPILLARY: 143 mg/dL — AB (ref 65–99)
Glucose-Capillary: 135 mg/dL — ABNORMAL HIGH (ref 65–99)
Glucose-Capillary: 84 mg/dL (ref 65–99)

## 2016-05-30 MED ORDER — ENOXAPARIN SODIUM 30 MG/0.3ML ~~LOC~~ SOLN
30.0000 mg | SUBCUTANEOUS | Status: DC
  Administered 2016-05-30 – 2016-05-31 (×2): 30 mg via SUBCUTANEOUS
  Filled 2016-05-30 (×2): qty 0.3

## 2016-05-30 MED ORDER — METFORMIN HCL 500 MG PO TABS: 500.0000 mg | ORAL_TABLET | Freq: Two times a day (BID) | ORAL | Status: DC

## 2016-05-30 MED ORDER — SODIUM CHLORIDE 0.9 % IV SOLN
INTRAVENOUS | Status: DC
  Administered 2016-05-30 – 2016-05-31 (×3): via INTRAVENOUS

## 2016-05-30 MED ORDER — LISINOPRIL 10 MG PO TABS
10.0000 mg | ORAL_TABLET | Freq: Every day | ORAL | Status: DC
  Filled 2016-05-30: qty 1

## 2016-05-30 MED ORDER — GLIMEPIRIDE 2 MG PO TABS
2.0000 mg | ORAL_TABLET | Freq: Every day | ORAL | Status: DC
  Administered 2016-05-31 – 2016-06-04 (×5): 2 mg via ORAL
  Filled 2016-05-30 (×5): qty 1

## 2016-05-30 MED ORDER — HYDROCHLOROTHIAZIDE 12.5 MG PO CAPS: 12.5000 mg | ORAL_CAPSULE | Freq: Every day | ORAL | Status: DC

## 2016-05-30 NOTE — Progress Notes (Signed)
Physical Therapy Session Note  Patient Details  Name: Paige Stewart MRN: [id number] Date of Birth: 12/10/1950  Today's Date: 05/30/2016 PT Individual Time: 0900-1000 PT Individual Time Calculation (min): 60 min   Short Term Goals: Week 1:  PT Short Term Goal 1 (Week 1): Pt will transfer with max assist and LRAD PT Short Term Goal 2 (Week 1): Pt will ambulate 30' with max assist for gait, +2 for w/c follow PT Short Term Goal 3 (Week 1): Pt will demonstrate static sitting balance without back support x5 minutes with supervision PT Short Term Goal 4 (Week 1): Pt will initiate stair training with PT  Skilled Therapeutic Interventions/Progress Updates:    no c/o pain, but does report fatigue and earlier nausea.  Per discussion with pt's daughter and RN, pt to remain in room for therapy this AM.  Session focus on postural control, sitting balance, transfers, and activity tolerance during self care tasks.    Pt transitions supine>sit towards the R side, using bedrails and HOB elevated, mod assist for trunk elevation with increasing assist from patient.  Pt requires min to mod assist for static sitting balance at EOB, focus on maintaining midline and L weight shift.  Squat/pivot bed>w/c on R with mod assist but requiring 3 small pivots to get to w/c.  Pt engaged in self care tasks at sink focus on activity tolerance, forward weight shift, postural control, sit<>stand, and standing balance with LUE support for washing face, and dressing.  RN in to start IV fluids so pt transfers w/c>recliner towards R with max/total assist for stand/pivot.  Pt positioned in recliner with call bell in reach and needs met.  Missed 15 minutes due to RN care.   Therapy Documentation Precautions:  Precautions Precautions: Fall Precaution Comments: dense R hemiparesis Restrictions Weight Bearing Restrictions: No General: PT Amount of Missed Time (min): 15 Minutes PT Missed Treatment Reason: Nursing care   See  Function Navigator for Current Functional Status.   Therapy/Group: Individual Therapy  Gilles Trimpe E Penven-Crew 05/30/2016, 10:13 AM

## 2016-05-30 NOTE — Progress Notes (Addendum)
Patient with evidence of acute renal failure likely due to emesis as well as hypotension with BP 94/65 - 106/59. Currently on dysphagia diet with nectar liquids likely contributing to AKI. Will hydrate with IVF. Discontinue HCTZ and hold metformin till SCr improves. Will decrease lisinopril to 10 mg with parameters to hold if SBP< 120. Recheck labs in am.   Daughter Leander Rams is trying to keep her mother's spirits up. She reports that patient was appearing quiet and depressed a couple of weeks PTA with impending return home.

## 2016-05-30 NOTE — Plan of Care (Signed)
Pt's plan of care adjusted to 15/7 after speaking with care team and discussed with MD in team conference as pt currently unable to tolerate current therapy schedule with OT, PT, and SLP.   Teodoro Kil, PT, DPT 05/30/16 11:55 AM

## 2016-05-30 NOTE — Progress Notes (Signed)
Speech Language Pathology Daily Session Notes  Patient Details  Name: Paige Stewart MRN: [id number] Date of Birth: Mar 11, 1950  Today's Date: 05/30/2016  Session 1: SLP Individual Time: 1030-1100 SLP Individual Time Calculation (min): 30 min    Session 2: SLP Individual Time: 1330-1400 SLP Individual Time Calculation (min): 30 min   Short Term Goals: Week 1: SLP Short Term Goal 1 (Week 1): Patient will consume current diet with minimal overt s/s of aspiration with Mod A verbal cues needed for use of swallowing compensatory strategies.  SLP Short Term Goal 2 (Week 1): Patient will utilize chin tuck with trials of thin liquids without overt s/s of aspiration in 75% of trials with Mod A verbal cues over 3 sessions prior to upgrade.  SLP Short Term Goal 3 (Week 1): Patient will utilize an increased vocal intensity at the word and phrase level to achieve ~75% intelligibility with Mod A verbal cues.  SLP Short Term Goal 4 (Week 1): Patient will name functional items with 75% accuracy and Min A verbal cues.  SLP Short Term Goal 5 (Week 1): Patient will attend to right field of enviornment during functional tasks with Max A multimodal cues. SLP Short Term Goal 6 (Week 1): Patient will initiate functional tasks with Mod A verbal and visual cues.   Skilled Therapeutic Interventions:  Session 1: Skilled treatment session focused on cognitive goals. Upon arrival, patient was asleep while in recliner. Patient's daughter reported she had recently received medications for nausea which made patient lethargic. SLP facilitated session by providing Max A verbal and tactile cues for arousal for ~60 second intervals. Patient required Max A verbal cues for use of an increased vocal intensity to achieve ~25% intelligibility at the word and phrase level and for verbal initiation with picture description task. Patient eventually reported that she felt "unhealthy." RN made aware of fatigue. Patient left in recliner  with daughter present and all needs within reach. Continue with current plan of care.   Session 2: Skilled treatment session focused on cognitive goals. SLP facilitated session by initially providing Max A verbal cues which faded to Min A by end of task for problem solving during a basic task in which the patient had to copy a picture to make a specific pattern with pegs. Patient initiated task with Mod A verbal cues and demonstrated sustained attention to task for ~3 minutes with Min A verbal cues. Patient required Max A verbal cues for speech intelligibility at the word level while counting the pegs and identifying their color to achieve ~25% intelligibility. Patient also consumed small amounts of nectar-thick liquids without overt s/s of aspiration but required total A for use of small sips. Patient left upright in recliner with all needs within reach. Continue with current plan of care.   Function:  Eating Eating   Modified Consistency Diet: Yes Eating Assist Level: Set up assist for;Supervision or verbal cues   Eating Set Up Assist For: Opening containers       Cognition Comprehension Comprehension assist level: Understands basic 25 - 49% of the time/ requires cueing 50 - 75% of the time  Expression   Expression assist level: Expresses basis less than 25% of the time/requires cueing >75% of the time.  Social Interaction Social Interaction assist level: Interacts appropriately 50 - 74% of the time - May be physically or verbally inappropriate.  Problem Solving Problem solving assist level: Solves basic 25 - 49% of the time - needs direction more than half the time to  initiate, plan or complete simple activities  Memory Memory assist level: Recognizes or recalls less than 25% of the time/requires cueing greater than 75% of the time    Pain No/Denies Pain   Therapy/Group: Individual Therapy  Addalie Calles 05/30/2016, 3:32 PM

## 2016-05-30 NOTE — Progress Notes (Signed)
Subjective/Complaints: Somnolent after compazine, vomited while sleeping this am, also vomited yesterday  ROS: Denies CP, SOB, N/V/D.  Objective: Vital Signs: Blood pressure (!) 106/59, pulse 82, temperature 98.3 F (36.8 C), temperature source Oral, resp. rate 16, height  (1.499 m), weight 66 kg (145 lb 8.1 oz), SpO2 100 %. Dg Chest 2 View  Result Date: 05/29/2016 CLINICAL DATA:  Possible aspiration following vomiting EXAM: CHEST  2 VIEW COMPARISON:  None. FINDINGS: Cardiac shadow is mildly enlarged. No vascular congestion is seen. The lungs are well-aerated without focal infiltrate or sizable effusion. No bony abnormality is noted. IMPRESSION: No active cardiopulmonary disease. Electronically Signed   By: Alcide Clever M.D.   On: 05/29/2016 16:31   Results for orders placed or performed during the hospital encounter of 05/25/16 (from the past 72 hour(s))  Glucose, capillary     Status: None   Collection Time: 05/27/16 12:11 PM  Result Value Ref Range   Glucose-Capillary 92 65 - 99 mg/dL   Comment 1 Notify RN   Glucose, capillary     Status: Abnormal   Collection Time: 05/27/16  5:20 PM  Result Value Ref Range   Glucose-Capillary 115 (H) 65 - 99 mg/dL  Glucose, capillary     Status: Abnormal   Collection Time: 05/27/16  9:25 PM  Result Value Ref Range   Glucose-Capillary 120 (H) 65 - 99 mg/dL   Comment 1 Notify RN   Glucose, capillary     Status: None   Collection Time: 05/28/16  6:35 AM  Result Value Ref Range   Glucose-Capillary 98 65 - 99 mg/dL   Comment 1 Notify RN   Glucose, capillary     Status: Abnormal   Collection Time: 05/28/16 12:00 PM  Result Value Ref Range   Glucose-Capillary 143 (H) 65 - 99 mg/dL  Glucose, capillary     Status: Abnormal   Collection Time: 05/28/16  5:00 PM  Result Value Ref Range   Glucose-Capillary 103 (H) 65 - 99 mg/dL  Glucose, capillary     Status: Abnormal   Collection Time: 05/28/16  9:05 PM  Result Value Ref Range    Glucose-Capillary 121 (H) 65 - 99 mg/dL  Glucose, capillary     Status: Abnormal   Collection Time: 05/29/16  6:27 AM  Result Value Ref Range   Glucose-Capillary 111 (H) 65 - 99 mg/dL  Glucose, capillary     Status: Abnormal   Collection Time: 05/29/16 11:25 AM  Result Value Ref Range   Glucose-Capillary 119 (H) 65 - 99 mg/dL     HEENT: Normocephalic, atraumatic Cardio: RRR. No JVD. Resp: CTA B/L and unlaboured GI: BS positive and ND Skin:   Intact. Warm and dry. Neuro: Flat,  Motor 0 /5 in RUE and RLE Musc/Skel:  No edema. No tenderness. Gen NAD. Vital signs reviewed.   Assessment/Plan: 1. Functional deficits secondary to Basal ganglia infarct which require 3+ hours per day of interdisciplinary therapy in a comprehensive inpatient rehab setting. Physiatrist is providing close team supervision and 24 hour management of active medical problems listed below. Physiatrist and rehab team continue to assess barriers to discharge/monitor patient progress toward functional and medical goals. FIM: Function - Bathing Position: Wheelchair/chair at sink Body parts bathed by patient: Right arm, Chest, Abdomen, Front perineal area, Right upper leg, Left upper leg, Left lower leg Body parts bathed by helper: Left arm, Buttocks, Right lower leg, Back Assist Level:  (Mod A)  Function- Upper Body Dressing/Undressing What is the  patient wearing?: Pull over shirt/dress Pull over shirt/dress - Perfomed by patient: Put head through opening Pull over shirt/dress - Perfomed by helper: Thread/unthread right sleeve, Pull shirt over trunk, Thread/unthread left sleeve Button up shirt - Perfomed by patient: Thread/unthread right sleeve Button up shirt - Perfomed by helper: Thread/unthread left sleeve, Button/unbutton shirt, Pull shirt around back Assist Level:  (Max A) Function - Lower Body Dressing/Undressing What is the patient wearing?: Pants, Non-skid slipper socks Position: Wheelchair/chair at  sink Pants- Performed by helper: Thread/unthread right pants leg, Thread/unthread left pants leg, Pull pants up/down Non-skid slipper socks- Performed by helper: Don/doff right sock, Don/doff left sock Assist for footwear: Dependant Assist for lower body dressing:  (total A)  Function - Toileting Toileting activity did not occur: No continent bowel/bladder event Toileting steps completed by helper: Adjust clothing prior to toileting, Performs perineal hygiene, Adjust clothing after toileting Assist level: Two helpers  Function - Archivist transfer assistive device: Drop arm commode Assist level to toilet: Total assist (Pt < 25%) Assist level from toilet: Total assist (Pt < 25%)  Function - Chair/bed transfer Chair/bed transfer method: Squat pivot Chair/bed transfer assist level: Total assist (Pt < 25%) Chair/bed transfer assistive device: Bedrails, Armrests Chair/bed transfer details: Verbal cues for precautions/safety, Verbal cues for safe use of DME/AE, Tactile cues for placement, Manual facilitation for placement, Manual facilitation for weight shifting  Function - Locomotion: Wheelchair Will patient use wheelchair at discharge?: Yes Type: Manual Max wheelchair distance: 150 Assist Level: Dependent (Pt equals 0%) Assist Level: Dependent (Pt equals 0%) Assist Level: Dependent (Pt equals 0%) Turns around,maneuvers to table,bed, and toilet,negotiates 3% grade,maneuvers on rugs and over doorsills: No Function - Locomotion: Ambulation Assistive device: Rail in hallway Max distance: 10 Assist level: 2 helpers (total for gait, +2 for w/c follow) Assist level: 2 helpers Walk 50 feet with 2 turns activity did not occur: Safety/medical concerns Walk 150 feet activity did not occur: Safety/medical concerns Walk 10 feet on uneven surfaces activity did not occur: Safety/medical concerns  Function - Comprehension Comprehension: Auditory Comprehension assist level:  Understands basic 25 - 49% of the time/ requires cueing 50 - 75% of the time  Function - Expression Expression: Verbal Expression assist level: Expresses basis less than 25% of the time/requires cueing >75% of the time.  Function - Social Interaction Social Interaction assist level: Interacts appropriately 50 - 74% of the time - May be physically or verbally inappropriate.  Function - Problem Solving Problem solving assist level: Solves basic 25 - 49% of the time - needs direction more than half the time to initiate, plan or complete simple activities  Function - Memory Memory assist level: Recognizes or recalls less than 25% of the time/requires cueing greater than 75% of the time Patient normally able to recall (first 3 days only): That he or she is in a hospital  Medical Problem List and Plan: 1.  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  DVT Prophylaxis/Anticoagulation: Pharmaceutical: Lovenox,  Vascular study with small right baker's cyst, otherwise unremarkable 3. Pain Management: Added Sportscreme for back pain.  4. Mood: LCSW to follow for evaluation and support.  5. Neuropsych: This patient is not capable of making decisions on her own behalf. 6. Skin/Wound Care: pressure relief measures. 7. Fluids/Electrolytes/Nutrition: Monitor I/O.  8. HTN: monitor BP bid--continue HCTZ.   Controlled 4/9, now running on low side, reduced  to 12.5mg  Vitals:   05/29/16 1443 05/30/16 0438  BP: 94/65 (!) 106/59  Pulse: 80 82  Resp: 18 16  Temp: 98.6 F (37 C) 98.3 F (36.8 C)    9. Hypokalemia: added supplement.  K+ 3.8 on 4/6 10. ESBL E coli UTI: Changed to septra DS D# 6/7, may contribute to nausea 11. NSTEMI: Will need follow up NUC study in the future.  12. T2DM with peripheral neuropathy: Monitor BS ac/hs. Hgb A1c- 10.3-poorly controlled. reduce metformin  BID and increase  Amaryl  . Metformin may be  Causing vomiting CBG (last 3)   Recent Labs  05/28/16 2105 05/29/16 0627 05/29/16 1125  GLUCAP 121* 111* 119*      LOS (Days) 5 A FACE TO FACE EVALUATION WAS PERFORMED  KIRSTEINS,ANDREW E 05/30/2016, 8:24 AM

## 2016-05-30 NOTE — Progress Notes (Signed)
Occupational Therapy Session Note  Patient Details  Name: Paige Stewart MRN: [id number] Date of Birth: October 18, 1950  Today's Date: 05/30/2016 OT Individual Time: 1100-1140 OT Individual Time Calculation (min): 40 min  Pt missed 20 mins secondary to fatigue   Short Term Goals: Week 1:  OT Short Term Goal 1 (Week 1): Pt will complete toilet transfer with Max A +1 OT Short Term Goal 2 (Week 1): Pt will complete UB dressing using hemi techniques with Mod A OT Short Term Goal 3 (Week 1): Pt will maintain sitting balance during functional task seated EOB with min A  Skilled Therapeutic Interventions/Progress Updates:    Pt resting in recliner upon arrival with daughter present.  Pt expressed difficulty with engaging in tasks secondary to feeling sick and drowsy.  Pt initiated grooming tasks when presented with supplies.  Pt initiated moving forward in recliner in preparation for sit<>stand (max A).  Pt stood from recliner X 3 with max A and required max A for standing balance.  Pt required support at R knee for support when standing.  Pt noted with pushing tendencies to R when sitting unsupported and standing.  Pt initiated moving back into chair but required max A to complete task.  Pt with difficulty keeping eyes open and missed last 20 mins of session secondary to ongoing fatigue.   Therapy Documentation Precautions:  Precautions Precautions: Fall Precaution Comments: dense R hemiparesis Restrictions Weight Bearing Restrictions: No General: General OT Amount of Missed Time: 20 Minutes Pain: Pain Assessment Pain Assessment: No/denies pain  See Function Navigator for Current Functional Status.   Therapy/Group: Individual Therapy  Rich Brave 05/30/2016, 12:14 PM

## 2016-05-31 ENCOUNTER — Inpatient Hospital Stay (HOSPITAL_COMMUNITY): Payer: No Typology Code available for payment source | Admitting: Occupational Therapy

## 2016-05-31 ENCOUNTER — Inpatient Hospital Stay (HOSPITAL_COMMUNITY): Payer: No Typology Code available for payment source | Admitting: *Deleted

## 2016-05-31 ENCOUNTER — Inpatient Hospital Stay (HOSPITAL_COMMUNITY): Payer: No Typology Code available for payment source | Admitting: Physical Therapy

## 2016-05-31 ENCOUNTER — Inpatient Hospital Stay (HOSPITAL_COMMUNITY): Payer: No Typology Code available for payment source | Admitting: Speech Pathology

## 2016-05-31 LAB — BASIC METABOLIC PANEL
Anion gap: 8 (ref 5–15)
BUN: 37 mg/dL — ABNORMAL HIGH (ref 6–20)
CALCIUM: 9 mg/dL (ref 8.9–10.3)
CO2: 23 mmol/L (ref 22–32)
CREATININE: 1.16 mg/dL — AB (ref 0.44–1.00)
Chloride: 105 mmol/L (ref 101–111)
GFR, EST AFRICAN AMERICAN: 56 mL/min — AB (ref >60)
GFR, EST NON AFRICAN AMERICAN: 48 mL/min — AB (ref >60)
Glucose, Bld: 105 mg/dL — ABNORMAL HIGH (ref 65–99)
Potassium: 4.3 mmol/L (ref 3.5–5.1)
Sodium: 136 mmol/L (ref 135–145)

## 2016-05-31 LAB — GLUCOSE, CAPILLARY
GLUCOSE-CAPILLARY: 113 mg/dL — AB (ref 65–99)
Glucose-Capillary: 129 mg/dL — ABNORMAL HIGH (ref 65–99)
Glucose-Capillary: 152 mg/dL — ABNORMAL HIGH (ref 65–99)

## 2016-05-31 MED ORDER — METOPROLOL SUCCINATE ER 50 MG PO TB24
100.0000 mg | ORAL_TABLET | Freq: Every day | ORAL | Status: DC
  Administered 2016-06-01 – 2016-06-07 (×7): 100 mg via ORAL
  Filled 2016-05-31 (×7): qty 2

## 2016-05-31 MED ORDER — SODIUM CHLORIDE 0.45 % IV SOLN
INTRAVENOUS | Status: DC
  Administered 2016-05-31 – 2016-06-06 (×5): via INTRAVENOUS
  Administered 2016-06-06: 50 mL/h via INTRAVENOUS

## 2016-05-31 NOTE — Patient Care Conference (Signed)
Inpatient RehabilitationTeam Conference and Plan of Care Update Date: 05/31/2016   Time: 11:15 AM    Patient Name: Paige Stewart      Medical Record Number: [id number]  Date of Birth: Oct 10, 1950 Sex: Female         Room/Bed: 4W18C/4W18C-01 Payor Info: Payor: [employer] M[employer]AN / Plan: M[employer]AN [employer] / Product Type: *No Product type* /    Admitting Diagnosis: L MCA Infarct   Admit Date/Time:  05/25/2016  6:49 PM Admission Comments: No comment available   Primary Diagnosis:  <principal problem not specified> Principal Problem: <principal problem not specified>  Patient Active Problem List   Diagnosis Date Noted  . Poorly controlled type 2 diabetes mellitus with peripheral neuropathy (HCC)   . Right hemiplegia (HCC)   . Benign essential HTN   . E. coli UTI   . NSTEMI (non-ST elevated myocardial infarction) (HCC)   . Diabetic peripheral neuropathy (HCC)   . Basal ganglia infarction (HCC) 05/25/2016  . Type 2 diabetes mellitus without complication, without long-term current use of insulin (HCC)   . Urinary tract infection without hematuria   . Demand ischemia (HCC)   . Hypertension   . Acute right hemiparesis (HCC)   . 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    Expected Discharge Date: Expected Discharge Date: 06/16/16  Team Members Present: Physician leading conference: Arley Phenix, PsyD;Dr. Claudette Laws Social Worker Present: Dossie Der, LCSW Nurse Present: Carmie End, RN PT Present: Teodoro Kil, PT OT Present: Primitivo Gauze, OT SLP Present: Feliberto Gottron, SLP PPS Coordinator present : Tora Duck, RN, CRRN     Current Status/Progress Goal Weekly Team Focus  Medical   Acute renal failure resolving, nausea likely med related, Severe RIght hemiparesis  manage DM, HTN,   medication management   Bowel/Bladder   incontinent of bowel and bladder LBM 4-9  manage bowel and bladder  toilet patient every 3-4 hours during the day    Swallow/Nutrition/ Hydration   Dys. 1  textures with nectar-thick liquids, Max A  Min A with least restrictive diet  tolerance of current diet, use of swallowing strategies    ADL's   mod A of 2 with squat pivot, max A of 1 for squat pivot transfers, total A LB dressing, max A UB dressing  S standing balance, dressing, toileting; min A tub transfers, toilet transfers  RUE NMR, postural control, motor plannning, ADL training, pt education   Mobility   max overall  min assist overall  NMR, balance, activity tolerance, gait training, w/c mobility, attention   Communication   Max A for speech intelligibility  Min A  increase vocal intensity and overall intelligibility    Safety/Cognition/ Behavioral Observations  Max A  Min A  problem solving, attention, recall    Pain   no complaint of pain. complaint of nausea at times  less than 2  continue with plan of care    Skin   n/a            *See Care Plan and progress notes for long and short-term goals.  Barriers to Discharge: heavy physical assist, 3rd floor walk up    Possible Resolutions to Barriers:  ? if pt has more resources in [place]    Discharge Planning/Teaching Needs:  Home to daughter's and son in-law's apartment-issue is it is on the third floor. Need to discuss with daughter alternative plan if unable to get up stairs.      Team Discussion:  Goals of min-mod level and max assist for  stairs. Medically doing better-continuing IV fluids and back to baseline with renal function. Adjusting BP and DM meds. Incont B & B. Last dose of antibiotics for UTI today. Discussing options with pt and daughter question if can manage at home and issue with stairs-three flights.  Revisions to Treatment Plan:  DC 4/27   Continued Need for Acute Rehabilitation Level of Care: The patient requires daily medical management by a physician with specialized training in physical medicine and rehabilitation for the following conditions: Daily direction of a multidisciplinary physical  rehabilitation program to ensure safe treatment while eliciting the highest outcome that is of practical value to the patient.: Yes Daily medical management of patient stability for increased activity during participation in an intensive rehabilitation regime.: Yes Daily analysis of laboratory values and/or radiology reports with any subsequent need for medication adjustment of medical intervention for : Neurological problems;Diabetes problems;Blood pressure problems;Nutritional problems  Paras Kreider, Lemar Livings 06/01/2016, 9:36 AM

## 2016-05-31 NOTE — Progress Notes (Signed)
Occupational Therapy Session Note  Patient Details  Name: Paige Stewart MRN: [id number] Date of Birth: 11-Dec-1950  Today's Date: 05/31/2016 OT Individual Time: 1300-1345 OT Individual Time Calculation (min): 45 min    Short Term Goals: Week 1:  OT Short Term Goal 1 (Week 1): Pt will complete toilet transfer with Max A +1 OT Short Term Goal 2 (Week 1): Pt will complete UB dressing using hemi techniques with Mod A OT Short Term Goal 3 (Week 1): Pt will maintain sitting balance during functional task seated EOB with min A  Skilled Therapeutic Interventions/Progress Updates:    Pt seen for OT session focusing on functional standing balance, weight shift, midline orientation and UE ROM. Pt in supine upon arrival, agreeable to tx session, denied pain this afternoon. She transferred to EOB with multimodal cues for sequencing and assist for R UE/LE management. Focus on midline orientation standing in STEADY. Placed in front of mirror for visual feedback. She was able to sit in STEADY with therapist assisting for placement of R UE on bar with supervision and maintain midline orientation. In standing, pt with strong R lean, requiring max A to maintain balance. Multimodal cuing provided for return to midline which pt was able to initiate however unable to maintain.  Seated in w/c, completed towel pushes on table top with therapist providing total A for R UE engagement. Pt able to come forward off back of w/c with increased time. Completed x3 sets of 10 with rest breaks provided btwn sets.  Pt requesting return to supine at end of session. STEADY used for transfer back to bed with +1 assist, max A to reposition in bed. Pt left in supine with bed alarm on and all needs in reach.   Therapy Documentation Precautions:  Precautions Precautions: Fall Precaution Comments: dense R hemiparesis Restrictions Weight Bearing Restrictions: No ADL: ADL ADL Comments: Please see functional navigator  See  Function Navigator for Current Functional Status.   Therapy/Group: Individual Therapy  Lewis, Xzayvion Vaeth C 05/31/2016, 7:22 AM

## 2016-05-31 NOTE — Progress Notes (Signed)
Social Work Patient ID: Paige Stewart, female   DOB: June 06, 1950, 66 y.o.   MRN: [id number]  Met with pt and daughter to discuss team conference goals min-mod level and concern regarding stairs to get into Daughter's home. Made aware of target discharge 4/27. Daughter wants to see how pt does and work on the best plan for her. Daughter hopes she will do better than therapy team thinks she will do. Discussed options with both and want to wait and see. Daughter is not working at this time so can assist her and has a medical background. Will work on the best plan for pt. Daughter reports she will not Be going back to her home anytime soon.

## 2016-05-31 NOTE — Progress Notes (Signed)
Recreational Therapy Session Note  Patient Details  Name: Alsha Meland MRN: [id number] Date of Birth: [birth year] Today's Date: 05/31/2016  TR eval deferred.  Pt with low activity tolerance.  Will continue to monitor through team for future participation.  Chalmer Zheng 05/31/2016, 3:58 PM

## 2016-05-31 NOTE — Progress Notes (Signed)
Speech Language Pathology Daily Session Note  Patient Details  Name: Paige Stewart MRN: [id number] Date of Birth: 10-16-1950  Today's Date: 05/31/2016 SLP Individual Time: 0800-0900 SLP Individual Time Calculation (min): 60 min  Short Term Goals: Week 1: SLP Short Term Goal 1 (Week 1): Patient will consume current diet with minimal overt s/s of aspiration with Mod A verbal cues needed for use of swallowing compensatory strategies.  SLP Short Term Goal 2 (Week 1): Patient will utilize chin tuck with trials of thin liquids without overt s/s of aspiration in 75% of trials with Mod A verbal cues over 3 sessions prior to upgrade.  SLP Short Term Goal 3 (Week 1): Patient will utilize an increased vocal intensity at the word and phrase level to achieve ~75% intelligibility with Mod A verbal cues.  SLP Short Term Goal 4 (Week 1): Patient will name functional items with 75% accuracy and Min A verbal cues.  SLP Short Term Goal 5 (Week 1): Patient will attend to right field of enviornment during functional tasks with Max A multimodal cues. SLP Short Term Goal 6 (Week 1): Patient will initiate functional tasks with Mod A verbal and visual cues.   Skilled Therapeutic Interventions: Skilled treatment session focused on dysphagia and speech goals. Upon arrival, patient was awake and appeared brighter and more alert compared to yesterday. SLP facilitated session by providing skilled observation with breakfast meal of Dys. 1 textures with nectar-thick liquids. Patient required Max A verbal cues for utilization of a slow rate and small bites/sips and demonstrated increased overt s/s of aspiration this meal. Patient also demonstrated increased vocal intensity and was ~40% intelligible at the phrase level with Max A verbal cues for use of speech intelligibility strategies. Patient left upright in bed with all needs within reach. Continue with current plan of care.      Function:  Eating Eating   Modified  Consistency Diet: Yes Eating Assist Level: Set up assist for;Supervision or verbal cues;Help managing cup/glass   Eating Set Up Assist For: Opening containers       Cognition Comprehension Comprehension assist level: Understands basic 25 - 49% of the time/ requires cueing 50 - 75% of the time  Expression   Expression assist level: Expresses basis less than 25% of the time/requires cueing >75% of the time.  Social Interaction Social Interaction assist level: Interacts appropriately 50 - 74% of the time - May be physically or verbally inappropriate.  Problem Solving Problem solving assist level: Solves basic 25 - 49% of the time - needs direction more than half the time to initiate, plan or complete simple activities  Memory Memory assist level: Recognizes or recalls less than 25% of the time/requires cueing greater than 75% of the time    Pain No/Denies Pain   Therapy/Group: Individual Therapy  Paige Stewart 05/31/2016, 3:33 PM

## 2016-05-31 NOTE — Progress Notes (Signed)
Physical Therapy Session Note  Patient Details  Name: Paige Stewart MRN: [id number] Date of Birth: 1950-04-06  Today's Date: 05/31/2016 PT Individual Time: 0910-1006 PT Individual Time Calculation (min): 56 min   Short Term Goals: Week 1:  PT Short Term Goal 1 (Week 1): Pt will transfer with max assist and LRAD PT Short Term Goal 2 (Week 1): Pt will ambulate 30' with max assist for gait, +2 for w/c follow PT Short Term Goal 3 (Week 1): Pt will demonstrate static sitting balance without back support x5 minutes with supervision PT Short Term Goal 4 (Week 1): Pt will initiate stair training with PT  Skilled Therapeutic Interventions/Progress Updates:    Pt in bed upon arrival receiving meds from RN.  Pt c/o headache but no nausea.  Therapy session focused on functional mobility, sitting endurance, sitting balance, motor planning, and coordination.  Pt performs rolling to R with mod assist, verbal cues and use of bed rails.  Donning of pants at bed level, with mod assist.  Supine>sit with max assist and squat/pivot to w/c on L with max assist.  Donning of shirt while in w/c with mod assist and erbal cues for sequencing.  Pt c/o dizziness after transfers, BP 129/66.  In therapy gym, pt transferred w/c>therapy mat on R with slide board and max assist to facilitate forward weight shift and head/hips relationship.  Pt performing static sitting balance with self-cueing in mirror and up to min tactile cues for 10 minutes, feet propped on 6" box for proprioceptive feedback.  While sitting, pt negotiated dynamic balance x2 reaching for cups high/L to facilitate trunk elongation on L and shortening on R.  Tactile cues for anterior pelvic tilt and opening up of chest to reach for cup.  Pt responded well to reaching out of comfort zone today, but fatigued by end of session.  Slide board transfer back to w/c on L mod assist.  Pt returned to room and positioned upright in w/c with QRB in place, call bell in reach and  needs met.   Therapy Documentation Precautions:  Precautions Precautions: Fall Precaution Comments: dense R hemiparesis Restrictions Weight Bearing Restrictions: No   See Function Navigator for Current Functional Status.   Therapy/Group: Individual Therapy  Earnest Conroy Penven-Crew 05/31/2016, 12:39 PM

## 2016-05-31 NOTE — Progress Notes (Signed)
Occupational Therapy Session Note  Patient Details  Name: Paige Stewart MRN: [id number] Date of Birth: 27-Jan-1951  Today's Date: 05/31/2016 OT Individual Time: 1191-4782 OT Individual Time Calculation (min): 30 min    Short Term Goals: Week 1:  OT Short Term Goal 1 (Week 1): Pt will complete toilet transfer with Max A +1 OT Short Term Goal 2 (Week 1): Pt will complete UB dressing using hemi techniques with Mod A OT Short Term Goal 3 (Week 1): Pt will maintain sitting balance during functional task seated EOB with min A  Skilled Therapeutic Interventions/Progress Updates:    Pt seen this session to facilitate sit balance, sit to stand and RUE.  Pt received in bed and agreeable to tx. Sat to EOB with mod - max A. Once sitting, RUE placed on bedside table with L hand in lap. Pt able to maintain posture with S.  Used PROM and tapping to try to elicit some muscle activity in RUE but unable to activate.  Worked on dynamic reaching to her L and sit to stand with sturdy arm chair in front of pt for support. Full support needed through L leg and under L arm, pt stood 4x for 15-20 seconds each.  Pt worked on scooting to L with max A to move hips closer to pillow.  Pt adjusted in bed and her daughter arrived at the end of the session.  Bed alarm on.  Therapy Documentation Precautions:  Precautions Precautions: Fall Precaution Comments: dense R hemiparesis Restrictions Weight Bearing Restrictions: No   Pain: no c/o pain   ADL: ADL ADL Comments: Please see functional navigator   See Function Navigator for Current Functional Status.   Therapy/Group: Individual Therapy  Denaly Gatling 05/31/2016, 11:04 AM

## 2016-05-31 NOTE — Progress Notes (Signed)
Subjective/Complaints:   ROS: Denies CP, SOB, N/V/D.  Objective: Vital Signs: Blood pressure 131/71, pulse 81, temperature 98.5 F (36.9 C), temperature source Oral, resp. rate 18, height 4' 11"  (1.499 m), weight 66 kg (145 lb 8.1 oz), SpO2 100 %. Dg Chest 2 View  Result Date: 05/29/2016 CLINICAL DATA:  Possible aspiration following vomiting EXAM: CHEST  2 VIEW COMPARISON:  None. FINDINGS: Cardiac shadow is mildly enlarged. No vascular congestion is seen. The lungs are well-aerated without focal infiltrate or sizable effusion. No bony abnormality is noted. IMPRESSION: No active cardiopulmonary disease. Electronically Signed   By: Inez Catalina M.D.   On: 05/29/2016 16:31   Results for orders placed or performed during the hospital encounter of 05/25/16 (from the past 72 hour(s))  Glucose, capillary     Status: Abnormal   Collection Time: 05/28/16 12:00 PM  Result Value Ref Range   Glucose-Capillary 143 (H) 65 - 99 mg/dL  Glucose, capillary     Status: Abnormal   Collection Time: 05/28/16  5:00 PM  Result Value Ref Range   Glucose-Capillary 103 (H) 65 - 99 mg/dL  Glucose, capillary     Status: Abnormal   Collection Time: 05/28/16  9:05 PM  Result Value Ref Range   Glucose-Capillary 121 (H) 65 - 99 mg/dL  Glucose, capillary     Status: Abnormal   Collection Time: 05/29/16  6:27 AM  Result Value Ref Range   Glucose-Capillary 111 (H) 65 - 99 mg/dL  Glucose, capillary     Status: Abnormal   Collection Time: 05/29/16 11:25 AM  Result Value Ref Range   Glucose-Capillary 119 (H) 65 - 99 mg/dL  Glucose, capillary     Status: None   Collection Time: 05/29/16  4:43 PM  Result Value Ref Range   Glucose-Capillary 94 65 - 99 mg/dL  Glucose, capillary     Status: Abnormal   Collection Time: 05/29/16  9:52 PM  Result Value Ref Range   Glucose-Capillary 126 (H) 65 - 99 mg/dL   Comment 1 Notify RN   Glucose, capillary     Status: Abnormal   Collection Time: 05/30/16  6:43 AM  Result  Value Ref Range   Glucose-Capillary 135 (H) 65 - 99 mg/dL   Comment 1 Notify RN   Basic metabolic panel     Status: Abnormal   Collection Time: 05/30/16  7:36 AM  Result Value Ref Range   Sodium 135 135 - 145 mmol/L   Potassium 4.8 3.5 - 5.1 mmol/L   Chloride 104 101 - 111 mmol/L   CO2 24 22 - 32 mmol/L   Glucose, Bld 130 (H) 65 - 99 mg/dL   BUN 49 (H) 6 - 20 mg/dL   Creatinine, Ser 2.04 (H) 0.44 - 1.00 mg/dL   Calcium 9.4 8.9 - 10.3 mg/dL   GFR calc non Af Amer 24 (L) >60 mL/min   GFR calc Af Amer 28 (L) >60 mL/min    Comment: (NOTE) The eGFR has been calculated using the CKD EPI equation. This calculation has not been validated in all clinical situations. eGFR's persistently <60 mL/min signify possible Chronic Kidney Disease.    Anion gap 7 5 - 15  CBC     Status: Abnormal   Collection Time: 05/30/16  7:36 AM  Result Value Ref Range   WBC 4.7 4.0 - 10.5 K/uL   RBC 3.91 3.87 - 5.11 MIL/uL   Hemoglobin 11.5 (L) 12.0 - 15.0 g/dL   HCT 34.5 (L) 36.0 -  46.0 %   MCV 88.2 78.0 - 100.0 fL   MCH 29.4 26.0 - 34.0 pg   MCHC 33.3 30.0 - 36.0 g/dL   RDW 13.2 11.5 - 15.5 %   Platelets 300 150 - 400 K/uL  Glucose, capillary     Status: Abnormal   Collection Time: 05/30/16 11:54 AM  Result Value Ref Range   Glucose-Capillary 143 (H) 65 - 99 mg/dL  Glucose, capillary     Status: None   Collection Time: 05/30/16  4:47 PM  Result Value Ref Range   Glucose-Capillary 84 65 - 99 mg/dL  Basic metabolic panel     Status: Abnormal   Collection Time: 05/31/16  4:30 AM  Result Value Ref Range   Sodium 136 135 - 145 mmol/L   Potassium 4.3 3.5 - 5.1 mmol/L   Chloride 105 101 - 111 mmol/L   CO2 23 22 - 32 mmol/L   Glucose, Bld 105 (H) 65 - 99 mg/dL   BUN 37 (H) 6 - 20 mg/dL   Creatinine, Ser 1.16 (H) 0.44 - 1.00 mg/dL   Calcium 9.0 8.9 - 10.3 mg/dL   GFR calc non Af Amer 48 (L) >60 mL/min   GFR calc Af Amer 56 (L) >60 mL/min    Comment: (NOTE) The eGFR has been calculated using the CKD  EPI equation. This calculation has not been validated in all clinical situations. eGFR's persistently <60 mL/min signify possible Chronic Kidney Disease.    Anion gap 8 5 - 15     HEENT: Normocephalic, atraumatic Cardio: RRR. No JVD. Resp: CTA B/L and unlaboured GI: BS positive and ND Skin:   Intact. Warm and dry. Neuro: Flat,  Motor 0 /5 in RUE and RLE Musc/Skel:  No edema. No tenderness. Gen NAD. Vital signs reviewed.   Assessment/Plan: 1. Functional deficits secondary to Basal ganglia infarct which require 3+ hours per day of interdisciplinary therapy in a comprehensive inpatient rehab setting. Physiatrist is providing close team supervision and 24 hour management of active medical problems listed below. Physiatrist and rehab team continue to assess barriers to discharge/monitor patient progress toward functional and medical goals. FIM: Function - Bathing Position: Wheelchair/chair at sink Body parts bathed by patient: Right arm, Chest, Abdomen, Front perineal area, Right upper leg, Left upper leg, Left lower leg Body parts bathed by helper: Left arm, Buttocks, Right lower leg, Back Assist Level:  (Mod A)  Function- Upper Body Dressing/Undressing What is the patient wearing?: Pull over shirt/dress Pull over shirt/dress - Perfomed by patient: Thread/unthread right sleeve, Thread/unthread left sleeve, Pull shirt over trunk Pull over shirt/dress - Perfomed by helper: Put head through opening Button up shirt - Perfomed by patient: Thread/unthread right sleeve Button up shirt - Perfomed by helper: Thread/unthread left sleeve, Button/unbutton shirt, Pull shirt around back Assist Level:  (Max A) Function - Lower Body Dressing/Undressing What is the patient wearing?: Socks, Pants Position: Wheelchair/chair at sink Pants- Performed by helper: Thread/unthread right pants leg, Thread/unthread left pants leg, Pull pants up/down Non-skid slipper socks- Performed by helper: Don/doff  right sock, Don/doff left sock Assist for footwear: Dependant Assist for lower body dressing: 2 Helpers  Function - Toileting Toileting activity did not occur: No continent bowel/bladder event Toileting steps completed by helper: Adjust clothing prior to toileting, Performs perineal hygiene, Adjust clothing after toileting Assist level: Two helpers  Function - Air cabin crew transfer assistive device: Drop arm commode Assist level to toilet: Total assist (Pt < 25%) Assist level from toilet: Total  assist (Pt < 25%)  Function - Chair/bed transfer Chair/bed transfer method: Squat pivot Chair/bed transfer assist level: Maximal assist (Pt 25 - 49%/lift and lower) Chair/bed transfer assistive device: Bedrails, Armrests Chair/bed transfer details: Verbal cues for precautions/safety, Verbal cues for safe use of DME/AE, Tactile cues for placement, Manual facilitation for placement, Manual facilitation for weight shifting  Function - Locomotion: Wheelchair Will patient use wheelchair at discharge?: Yes Type: Manual Max wheelchair distance: 150 Assist Level: Dependent (Pt equals 0%) Assist Level: Dependent (Pt equals 0%) Assist Level: Dependent (Pt equals 0%) Turns around,maneuvers to table,bed, and toilet,negotiates 3% grade,maneuvers on rugs and over doorsills: No Function - Locomotion: Ambulation Assistive device: Rail in hallway Max distance: 10 Assist level: 2 helpers (total for gait, +2 for w/c follow) Assist level: 2 helpers Walk 50 feet with 2 turns activity did not occur: Safety/medical concerns Walk 150 feet activity did not occur: Safety/medical concerns Walk 10 feet on uneven surfaces activity did not occur: Safety/medical concerns  Function - Comprehension Comprehension: Auditory Comprehension assist level: Understands basic 25 - 49% of the time/ requires cueing 50 - 75% of the time  Function - Expression Expression: Verbal Expression assist level: Expresses  basis less than 25% of the time/requires cueing >75% of the time.  Function - Social Interaction Social Interaction assist level: Interacts appropriately 50 - 74% of the time - May be physically or verbally inappropriate.  Function - Problem Solving Problem solving assist level: Solves basic 25 - 49% of the time - needs direction more than half the time to initiate, plan or complete simple activities  Function - Memory Memory assist level: Recognizes or recalls less than 25% of the time/requires cueing greater than 75% of the time Patient normally able to recall (first 3 days only): That he or she is in a hospital  Medical Problem List and Plan: 1.  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 Formulized individual treatment plan based on medical history, underlying problem and comorbidities.   Fluoxetine ordered 2.  DVT Prophylaxis/Anticoagulation: Pharmaceutical: Lovenox,  Vascular study with small right baker's cyst, otherwise unremarkable 3. Pain Management: Added Sportscreme for back pain.  4. Mood: LCSW to follow for evaluation and support.  5. Neuropsych: This patient is not capable of making decisions on her own behalf. 6. Skin/Wound Care: pressure relief measures. 7. Fluids/Electrolytes/Nutrition: Monitor I/O.  8. HTN: monitor BP bid--continue HCTZ.   Controlled 4/10, now running on low side, reduced to 12.63m-will change agents due to poor intake Vitals:   05/30/16 1520 05/31/16 0457  BP: 131/72 131/71  Pulse: 84 81  Resp: 18 18  Temp: 98.3 F (36.8 C) 98.5 F (36.9 C)    9. Hypokalemia: added supplement.  K+ 3.8 on 4/6 10. ESBL E coli UTI: Changed to septra DS D# 7/7, may contribute to nausea 11. NSTEMI: Will need follow up NUC study in the future.  12. T2DM with  peripheral neuropathy: Monitor BS ac/hs. Hgb A1c- 10.3-poorly controlled. reduce metformin 50546mBID and increase  Amaryl 46m94mMetformin may be  Causing vomiting CBG (last 3)   Recent Labs  05/30/16 0643 05/30/16 1154 05/30/16 1647  GLUCAP 135* 143* 84   13.  ARF- prerenal azotemia with Metformin and septra, resolved on IVF, septra last dose today, try managing DM without metformin, d/c continuous .9NS, change to  qhs .45 NS, EF 45% monitor overload   LOS (Days) 6 A FACE TO FACE EVALUATION WAS PERFORMED  KIRSTEINS,ANDREW E 05/31/2016, 8:27 AM

## 2016-06-01 ENCOUNTER — Inpatient Hospital Stay (HOSPITAL_COMMUNITY): Payer: No Typology Code available for payment source | Admitting: Speech Pathology

## 2016-06-01 ENCOUNTER — Inpatient Hospital Stay (HOSPITAL_COMMUNITY): Payer: No Typology Code available for payment source | Admitting: Physical Therapy

## 2016-06-01 ENCOUNTER — Inpatient Hospital Stay (HOSPITAL_COMMUNITY): Payer: No Typology Code available for payment source | Admitting: Occupational Therapy

## 2016-06-01 LAB — GLUCOSE, CAPILLARY
GLUCOSE-CAPILLARY: 83 mg/dL (ref 65–99)
GLUCOSE-CAPILLARY: 129 mg/dL — AB (ref 65–99)
GLUCOSE-CAPILLARY: 129 mg/dL — AB (ref 65–99)
GLUCOSE-CAPILLARY: 121 mg/dL — AB (ref 65–99)

## 2016-06-01 MED ORDER — ENOXAPARIN SODIUM 40 MG/0.4ML ~~LOC~~ SOLN
40.0000 mg | SUBCUTANEOUS | Status: DC
  Administered 2016-06-01 – 2016-06-07 (×7): 40 mg via SUBCUTANEOUS
  Filled 2016-06-01 (×7): qty 0.4

## 2016-06-01 NOTE — Progress Notes (Signed)
Occupational Therapy Session Note  Patient Details  Name: Paige Stewart MRN: [id number] Date of Birth: [birth year]  Today's Date: 06/01/2016 OT Individual Time: 1000-1100 OT Individual Time Calculation (min): 60 min   Short Term Goals: Week 1:  OT Short Term Goal 1 (Week 1): Pt will complete toilet transfer with Max A +1 OT Short Term Goal 2 (Week 1): Pt will complete UB dressing using hemi techniques with Mod A OT Short Term Goal 3 (Week 1): Pt will maintain sitting balance during functional task seated EOB with min A  Skilled Therapeutic Interventions/Progress Updates:    OT treatment session focused on transfer training, sitting balance, and improved sit<>stand. Max A sup<>sit with min progressing to supervision to maintain sitting balance with R UE placed on lap. Lateral transfer to R using bobath method with Max A and multimodal cues for initiation. Pt incontinent of bladder requiring Max A sit<>stand and Max A to doff brief and wash bottom. Max multimodal cues to focus on maintaining standing balance before continuing with LB ADL task. Addressed sitting balance and reaching within LB dressing using hemi-techniques. Provided pt with proximal trunk support to increase forward reach to pull pant leg up. Pt left seated in wc at end of session with safety belt on, lap tray on R side and call bell in hand.  Therapy Documentation Precautions:  Precautions Precautions: Fall Precaution Comments: dense R hemiparesis Restrictions Weight Bearing Restrictions: No Pain:  denies pain ADL: ADL ADL Comments: Please see functional navigator  See Function Navigator for Current Functional Status.   Therapy/Group: Individual Therapy  Mal Amabile 06/01/2016, 10:41 AM

## 2016-06-01 NOTE — Progress Notes (Signed)
Physical Therapy Session Note  Patient Details  Name: Paige Stewart MRN: [id number] Date of Birth: [birth year]  Today's Date: 06/01/2016 PT Individual Time: 1103-1200 and [id number] PT Individual Time Calculation (min): 57 min and 37 min   Short Term Goals: Week 1:  PT Short Term Goal 1 (Week 1): Pt will transfer with max assist and LRAD PT Short Term Goal 2 (Week 1): Pt will ambulate 30' with max assist for gait, +2 for w/c follow PT Short Term Goal 3 (Week 1): Pt will demonstrate static sitting balance without back support x5 minutes with supervision PT Short Term Goal 4 (Week 1): Pt will initiate stair training with PT  Skilled Therapeutic Interventions/Progress Updates:    Session 1: c/o of nausea on entering the room. RN notified and meds provided. Therapy session focused on NMR, sitting endurance, standing endurance, coordination, initiation, posture, postural control, and activity tolerance. Pt transferred L to R squat/pivot with modA from w/c to mat. Pt performed sitting dynamic balance x2 by reaching for cups high on L side to facilitate L side elongation and weight shift, then placed cup on floor to facilitate forward weight shift and pushing to return in neutral seated position. Mirror used in front of pt to facilitate midline orientation. Pt to standing with modA on R side to negotiate reaching for cups high on L side. Pt tolerated reaching well, c/o of exhaustion by end of activity. Pt returned to w/c with squat/pivot R to L and negotiated w/c propulsion using L hemi technique 28' with modA. Pt returned to room with call bell in reach and needs met.  Session 2: No c/o on entering.  Pt supine>sit with modA and verbal cues. Transferred to w/c squat/pivot R to L with modA and verbal cues. Pt completed table top activity with colored pegs attempting to recreate a pattern for visual scanning, perception, problem solving, and attention with max cues. Pt unable to recreate pattern despite max  cues. Pt in w/c with call bell in reach and needs met.  Therapy Documentation Precautions:  Precautions Precautions: Fall Precaution Comments: dense R hemiparesis Restrictions Weight Bearing Restrictions: No   See Function Navigator for Current Functional Status.   Therapy/Group: Individual Therapy  Earnest Conroy Penven-Crew 06/01/2016, 4:34 PM

## 2016-06-01 NOTE — Progress Notes (Signed)
Subjective/Complaints:  Pt denies vomiting last noc, not hungry, no pains  ROS: Denies CP, SOB, N/V/D.  Objective: Vital Signs: Blood pressure 138/86, pulse 89, temperature 97.6 F (36.4 C), temperature source Oral, resp. rate 18, height _0  (1.499 m), weight 63.3 kg (139 lb 9.6 oz), SpO2 99 %. No results found. Results for orders placed or performed during the hospital encounter of 05/25/16 (from the past 72 hour(s))  Glucose, capillary     Status: Abnormal   Collection Time: 05/29/16 11:25 AM  Result Value Ref Range   Glucose-Capillary 119 (H) 65 - 99 mg/dL  Glucose, capillary     Status: None   Collection Time: 05/29/16  4:43 PM  Result Value Ref Range   Glucose-Capillary 94 65 - 99 mg/dL  Glucose, capillary     Status: Abnormal   Collection Time: 05/29/16  9:52 PM  Result Value Ref Range   Glucose-Capillary 126 (H) 65 - 99 mg/dL   Comment 1 Notify RN   Glucose, capillary     Status: Abnormal   Collection Time: 05/30/16  6:43 AM  Result Value Ref Range   Glucose-Capillary 135 (H) 65 - 99 mg/dL   Comment 1 Notify RN   Basic metabolic panel     Status: Abnormal   Collection Time: 05/30/16  7:36 AM  Result Value Ref Range   Sodium 135 135 - 145 mmol/L   Potassium 4.8 3.5 - 5.1 mmol/L   Chloride 104 101 - 111 mmol/L   CO2 24 22 - 32 mmol/L   Glucose, Bld 130 (H) 65 - 99 mg/dL   BUN 49 (H) 6 - 20 mg/dL   Creatinine, Ser 2.04 (H) 0.44 - 1.00 mg/dL   Calcium 9.4 8.9 - 10.3 mg/dL   GFR calc non Af Amer 24 (L) >60 mL/min   GFR calc Af Amer 28 (L) >60 mL/min    Comment: (NOTE) The eGFR has been calculated using the CKD EPI equation. This calculation has not been validated in all clinical situations. eGFR's persistently <60 mL/min signify possible Chronic Kidney Disease.    Anion gap 7 5 - 15  CBC     Status: Abnormal   Collection Time: 05/30/16  7:36 AM  Result Value Ref Range   WBC 4.7 4.0 - 10.5 K/uL   RBC 3.91 3.87 - 5.11 MIL/uL   Hemoglobin 11.5 (L) 12.0 -  15.0 g/dL   HCT 34.5 (L) 36.0 - 46.0 %   MCV 88.2 78.0 - 100.0 fL   MCH 29.4 26.0 - 34.0 pg   MCHC 33.3 30.0 - 36.0 g/dL   RDW 13.2 11.5 - 15.5 %   Platelets 300 150 - 400 K/uL  Glucose, capillary     Status: Abnormal   Collection Time: 05/30/16 11:54 AM  Result Value Ref Range   Glucose-Capillary 143 (H) 65 - 99 mg/dL  Glucose, capillary     Status: None   Collection Time: 05/30/16  4:47 PM  Result Value Ref Range   Glucose-Capillary 84 65 - 99 mg/dL  Glucose, capillary     Status: Abnormal   Collection Time: 05/30/16  9:36 PM  Result Value Ref Range   Glucose-Capillary 129 (H) 65 - 99 mg/dL  Basic metabolic panel     Status: Abnormal   Collection Time: 05/31/16  4:30 AM  Result Value Ref Range   Sodium 136 135 - 145 mmol/L   Potassium 4.3 3.5 - 5.1 mmol/L   Chloride 105 101 - 111 mmol/L  CO2 23 22 - 32 mmol/L   Glucose, Bld 105 (H) 65 - 99 mg/dL   BUN 37 (H) 6 - 20 mg/dL   Creatinine, Ser 1.16 (H) 0.44 - 1.00 mg/dL   Calcium 9.0 8.9 - 10.3 mg/dL   GFR calc non Af Amer 48 (L) >60 mL/min   GFR calc Af Amer 56 (L) >60 mL/min    Comment: (NOTE) The eGFR has been calculated using the CKD EPI equation. This calculation has not been validated in all clinical situations. eGFR's persistently <60 mL/min signify possible Chronic Kidney Disease.    Anion gap 8 5 - 15  Glucose, capillary     Status: Abnormal   Collection Time: 05/31/16  6:20 AM  Result Value Ref Range   Glucose-Capillary 113 (H) 65 - 99 mg/dL  Glucose, capillary     Status: Abnormal   Collection Time: 05/31/16 11:37 AM  Result Value Ref Range   Glucose-Capillary 152 (H) 65 - 99 mg/dL     HEENT: Normocephalic, atraumatic Cardio: RRR. No JVD. Resp: CTA B/L and unlaboured GI: BS positive and ND Skin:   Intact. Warm and dry. Neuro: Flat,  Motor 0 /5 in RUE and RLE Musc/Skel:  No edema. No tenderness. Gen NAD. Vital signs reviewed.   Assessment/Plan: 1. Functional deficits secondary to Basal ganglia  infarct which require 3+ hours per day of interdisciplinary therapy in a comprehensive inpatient rehab setting. Physiatrist is providing close team supervision and 24 hour management of active medical problems listed below. Physiatrist and rehab team continue to assess barriers to discharge/monitor patient progress toward functional and medical goals. FIM: Function - Bathing Position: Wheelchair/chair at sink Body parts bathed by patient: Right arm, Chest, Abdomen, Front perineal area, Right upper leg, Left upper leg, Left lower leg Body parts bathed by helper: Left arm, Buttocks, Right lower leg, Back Assist Level:  (Mod A)  Function- Upper Body Dressing/Undressing What is the patient wearing?: Pull over shirt/dress Pull over shirt/dress - Perfomed by patient: Pull shirt over trunk, Put head through opening Pull over shirt/dress - Perfomed by helper: Thread/unthread left sleeve, Thread/unthread right sleeve Button up shirt - Perfomed by patient: Thread/unthread right sleeve Button up shirt - Perfomed by helper: Thread/unthread left sleeve, Button/unbutton shirt, Pull shirt around back Assist Level:  (Max A) Function - Lower Body Dressing/Undressing What is the patient wearing?: Socks, Pants Position: Bed Pants- Performed by patient: Pull pants up/down Pants- Performed by helper: Thread/unthread right pants leg, Thread/unthread left pants leg, Pull pants up/down Non-skid slipper socks- Performed by helper: Don/doff right sock, Don/doff left sock Assist for footwear: Dependant Assist for lower body dressing: 2 Helpers  Function - Toileting Toileting activity did not occur: No continent bowel/bladder event Toileting steps completed by helper: Adjust clothing prior to toileting, Performs perineal hygiene, Adjust clothing after toileting Assist level: Two helpers  Function - Air cabin crew transfer assistive device: Drop arm commode Assist level to toilet: Total assist (Pt <  25%) Assist level from toilet: Total assist (Pt < 25%)  Function - Chair/bed transfer Chair/bed transfer method: Squat pivot, Lateral scoot Chair/bed transfer assist level: Maximal assist (Pt 25 - 49%/lift and lower) Chair/bed transfer assistive device: Bedrails, Armrests Chair/bed transfer details: Verbal cues for precautions/safety, Verbal cues for safe use of DME/AE, Tactile cues for placement, Manual facilitation for placement, Manual facilitation for weight shifting  Function - Locomotion: Wheelchair Will patient use wheelchair at discharge?: Yes Type: Manual Max wheelchair distance: 150 Assist Level: Dependent (Pt equals 0%)  Assist Level: Dependent (Pt equals 0%) Assist Level: Dependent (Pt equals 0%) Turns around,maneuvers to table,bed, and toilet,negotiates 3% grade,maneuvers on rugs and over doorsills: No Function - Locomotion: Ambulation Assistive device: Rail in hallway Max distance: 10 Assist level: 2 helpers (total for gait, +2 for w/c follow) Assist level: 2 helpers Walk 50 feet with 2 turns activity did not occur: Safety/medical concerns Walk 150 feet activity did not occur: Safety/medical concerns Walk 10 feet on uneven surfaces activity did not occur: Safety/medical concerns  Function - Comprehension Comprehension: Auditory Comprehension assist level: Understands basic 25 - 49% of the time/ requires cueing 50 - 75% of the time  Function - Expression Expression: Verbal Expression assist level: Expresses basis less than 25% of the time/requires cueing >75% of the time.  Function - Social Interaction Social Interaction assist level: Interacts appropriately 50 - 74% of the time - May be physically or verbally inappropriate.  Function - Problem Solving Problem solving assist level: Solves basic 25 - 49% of the time - needs direction more than half the time to initiate, plan or complete simple activities  Function - Memory Memory assist level: Recognizes or recalls  less than 25% of the time/requires cueing greater than 75% of the time Patient normally able to recall (first 3 days only): That he or she is in a hospital  Medical Problem List and Plan: 1.  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  DVT Prophylaxis/Anticoagulation: Pharmaceutical: Lovenox,  Vascular study with small right baker's cyst, otherwise unremarkable 3. Pain Management: Added Sportscreme for back pain.  4. Mood: LCSW to follow for evaluation and support.  5. Neuropsych: This patient is not capable of making decisions on her own behalf. 6. Skin/Wound Care: pressure relief measures. 7. Fluids/Electrolytes/Nutrition: Monitor I/O.  8. HTN: monitor BP bid--continue HCTZ.   Controlled 4/11,off hctz now on toprol xl 163m Vitals:   05/31/16 1400 06/01/16 0427  BP: 136/88 138/86  Pulse: 86 89  Resp: 17 18  Temp: 97.5 F (36.4 C) 97.6 F (36.4 C)    9. Hypokalemia: added supplement.  K+ 4.3 on 4/11 10. ESBL E coli UTI: Chompleted septra DS 11. NSTEMI: Will need follow up NUC study in the future.  12. T2DM with peripheral neuropathy: Monitor BS ac/hs. Hgb A1c- 10.3-poorly controlled.  Amaryl 258m Currently controlled  CBG (last 3)   Recent Labs  05/30/16 2136 05/31/16 0620 05/31/16 1137  GLUCAP 129* 113* 152*   13.  ARF- prerenal azotemia with Metformin and septra, resolved on IVF, septra completed off metformin, d/c cont .45 NS qhs, EF 45% no signs overload 4/12   LOS (Days) 7 A FACE TO FACE EVALUATION WAS PERFORMED  KIRSTEINS,ANDREW E 06/01/2016, 8:05 AM

## 2016-06-01 NOTE — Progress Notes (Signed)
Speech Language Pathology Daily Session Note  Patient Details  Name: Paige Stewart MRN: [id number] Date of Birth: November 22, 1950  Today's Date: 06/01/2016 SLP Individual Time: 1430-1500 SLP Individual Time Calculation (min): 30 min  Short Term Goals: Week 1: SLP Short Term Goal 1 (Week 1): Patient will consume current diet with minimal overt s/s of aspiration with Mod A verbal cues needed for use of swallowing compensatory strategies.  SLP Short Term Goal 2 (Week 1): Patient will utilize chin tuck with trials of thin liquids without overt s/s of aspiration in 75% of trials with Mod A verbal cues over 3 sessions prior to upgrade.  SLP Short Term Goal 3 (Week 1): Patient will utilize an increased vocal intensity at the word and phrase level to achieve ~75% intelligibility with Mod A verbal cues.  SLP Short Term Goal 4 (Week 1): Patient will name functional items with 75% accuracy and Min A verbal cues.  SLP Short Term Goal 5 (Week 1): Patient will attend to right field of enviornment during functional tasks with Max A multimodal cues. SLP Short Term Goal 6 (Week 1): Patient will initiate functional tasks with Mod A verbal and visual cues.   Skilled Therapeutic Interventions: Skilled treatment session focused on speech goals. Patient demonstrated increased verbal expression this session but was initially less than 25% intelligible at the phrase level during spontaneous speech and 50% intelligible within a structured context. Patient able to name functional items and verbalize the function of the item with 75% accuracy. 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 Patient left upright in bed with all needs within reach. Continue with current plan of care.      Function:    Cognition Comprehension Comprehension assist level: Understands basic 50 - 74% of the time/ requires cueing 25 - 49% of the time  Expression   Expression assist level: Expresses basis less  than 25% of the time/requires cueing >75% of the time.  Social Interaction Social Interaction assist level: Interacts appropriately 50 - 74% of the time - May be physically or verbally inappropriate.  Problem Solving Problem solving assist level: Solves basic 25 - 49% of the time - needs direction more than half the time to initiate, plan or complete simple activities  Memory Memory assist level: Recognizes or recalls less than 25% of the time/requires cueing greater than 75% of the time    Pain No/Denies Pain   Therapy/Group: Individual Therapy  Tarica Harl 06/01/2016, 3:19 PM

## 2016-06-02 ENCOUNTER — Inpatient Hospital Stay (HOSPITAL_COMMUNITY): Payer: No Typology Code available for payment source | Admitting: Physical Therapy

## 2016-06-02 ENCOUNTER — Ambulatory Visit (HOSPITAL_COMMUNITY): Payer: No Typology Code available for payment source | Admitting: Physical Therapy

## 2016-06-02 ENCOUNTER — Inpatient Hospital Stay (HOSPITAL_COMMUNITY): Payer: No Typology Code available for payment source | Admitting: Speech Pathology

## 2016-06-02 ENCOUNTER — Inpatient Hospital Stay (HOSPITAL_COMMUNITY): Payer: No Typology Code available for payment source | Admitting: Occupational Therapy

## 2016-06-02 NOTE — Progress Notes (Signed)
Speech Language Pathology Weekly Progress and Session Note  Patient Details  Name: Paige Stewart MRN: [id number] Date of Birth: [birth year]  Beginning of progress report period: May 26, 2016 End of progress report period: June 03, 2014  Today's Date: 06/02/2016 SLP Individual Time: 0830-0930 SLP Individual Time Calculation (min): 60 min  Short Term Goals: Week 1: SLP Short Term Goal 1 (Week 1): Patient will consume current diet with minimal overt s/s of aspiration with Mod A verbal cues needed for use of swallowing compensatory strategies.  SLP Short Term Goal 1 - Progress (Week 1): Not met SLP Short Term Goal 2 (Week 1): Patient will utilize chin tuck with trials of thin liquids without overt s/s of aspiration in 75% of trials with Mod A verbal cues over 3 sessions prior to upgrade.  SLP Short Term Goal 2 - Progress (Week 1): Not met SLP Short Term Goal 3 (Week 1): Patient will utilize an increased vocal intensity at the word and phrase level to achieve ~75% intelligibility with Mod A verbal cues.  SLP Short Term Goal 3 - Progress (Week 1): Not met SLP Short Term Goal 4 (Week 1): Patient will name functional items with 75% accuracy and Min A verbal cues.  SLP Short Term Goal 4 - Progress (Week 1): Met SLP Short Term Goal 5 (Week 1): Patient will attend to right field of enviornment during functional tasks with Max A multimodal cues. SLP Short Term Goal 5 - Progress (Week 1): Met SLP Short Term Goal 6 (Week 1): Patient will initiate functional tasks with Mod A verbal and visual cues.  SLP Short Term Goal 6 - Progress (Week 1): Met    New Short Term Goals: Week 2: SLP Short Term Goal 1 (Week 2): Patient will consume current diet with minimal overt s/s of aspiration with Mod A verbal cues needed for use of swallowing compensatory strategies.  SLP Short Term Goal 2 (Week 2): Patient will utilize chin tuck with trials of thin liquids without overt s/s of aspiration in 75% of trials with Mod  A verbal cues over 3 sessions prior to upgrade.  SLP Short Term Goal 3 (Week 2): Patient will utilize an increased vocal intensity at the word and phrase level to achieve ~75% intelligibility with Mod A verbal cues.  SLP Short Term Goal 4 (Week 2): Patient will self-monitor and correct verbal errors at the phrase level with Max A multimodal cues.  SLP Short Term Goal 5 (Week 2): Patient will attend to right field of enviornment during functional tasks with Mod A multimodal cues. SLP Short Term Goal 6 (Week 2): Patient will demonstrate functional problem solving for basic and familiar tasks with Mod A verbal cues.   Weekly Progress Updates: Patient has made functional gains and has met 3 of 6 STG's this reporting period due to improved cognitive function. Currently, patient is consuming Dys. 1 textures with nectar-thick liquids with intermittent overt s/s of aspiration and Max A verbal cues needed for use of swallowing compensatory strategies. Patient continues to require total A with coordination of chin tuck with ice chips and thin liquids resulting in consistent overt s/s of aspiration. Patient requires Max A multimodal cues for use of speech intelligibility strategies at the phrase level to achieve 25-50% intelligibility and overall Mod-Max A to complete functional and familiar tasks safely in regards to initiation, sustained attention, attention to right field of environment, functional problem solving, awareness and recall of functional information. Patient and family education is ongoing. Patient  would benefit from continued skilled SLP intervention to maximize her cognitive-linguistic and swallowing function as well as her functional communication in order to maximize her functional independence prior to discharge.      Intensity: Minumum of 1-2 x/day, 30 to 90 minutes Frequency: 3 to 5 out of 7 days Duration/Length of Stay: 4/27 Treatment/Interventions: Cognitive remediation/compensation;Cueing  hierarchy;Environmental controls;Dysphagia/aspiration precaution training;Internal/external aids;Speech/Language facilitation;Therapeutic Activities;Functional tasks;Patient/family education   Daily Session  Skilled Therapeutic Interventions: Skilled treatment session focused on cognitive and dysphagia goals. SLP facilitated session by providing Mod A verbal cues for problem solving during a 4 step picture sequencing task. Patient also required Max A verbal cues for use of an increased vocal intensity to achieve ~50% intelligibility at the word level during picture description task with intermittent semantic and phonemic paraphasias noted in which patient required total A to self-monitor and correct. SLP also facilitated session by providing total A for coordination of chin tuck prior to swallow initiation with trials of ice chips after oral care. Patient unable to coordinate chin tuck resulting overt s/s of aspiration in 25% of opportunities with small, individual ice chips. Recommend continued trials with SLP only. Patient left upright in bed with all needs within reach.      Function:   Eating Eating   Modified Consistency Diet: Yes Eating Assist Level: Supervision or verbal cues;Help managing cup/glass           Cognition Comprehension Comprehension assist level: Understands basic 50 - 74% of the time/ requires cueing 25 - 49% of the time  Expression   Expression assist level: Expresses basis less than 25% of the time/requires cueing >75% of the time.  Social Interaction Social Interaction assist level: Interacts appropriately 50 - 74% of the time - May be physically or verbally inappropriate.  Problem Solving Problem solving assist level: Solves basic 25 - 49% of the time - needs direction more than half the time to initiate, plan or complete simple activities  Memory Memory assist level: Recognizes or recalls less than 25% of the time/requires cueing greater than 75% of the time    Pain No/Denies Pain   Therapy/Group: Individual Therapy  Hildegarde Dunaway 06/02/2016, 3:56 PM

## 2016-06-02 NOTE — Progress Notes (Signed)
Physical Therapy Weekly Progress Note  Patient Details  Name: Paige Stewart MRN: [id number] Date of Birth: Nov 05, 1950  Beginning of progress report period: May 26, 2016 End of progress report period: June 02, 2016  Today's Date: 06/02/2016 PT Individual Time: 1594-5859 PT Individual Time Calculation (min): 45 min   Patient has met 2 of 4 short term goals.  Pt initially making slow progress at start of reporting period, however has had several good days towards the latter half.  Pt has begun consistently transferring bed<>w/c with mod/max assist pending fatigue level, and has initiated ambulation and standing balance activities for NMR and activity tolerance.    Patient continues to demonstrate the following deficits muscle weakness, impaired timing and sequencing, abnormal tone, unbalanced muscle activation, decreased coordination and decreased motor planning, decreased midline orientation and decreased attention to right, decreased initiation, decreased attention, decreased awareness, decreased problem solving, decreased safety awareness, decreased memory and delayed processing and decreased sitting balance, decreased standing balance, decreased postural control, hemiplegia and decreased balance strategies and therefore will continue to benefit from skilled PT intervention to increase functional independence with mobility.  Patient progressing toward long term goals..  Continue plan of care.  PT Short Term Goals Week 1:  PT Short Term Goal 1 (Week 1): Pt will transfer with max assist and LRAD PT Short Term Goal 1 - Progress (Week 1): Met PT Short Term Goal 2 (Week 1): Pt will ambulate 30' with max assist for gait, +2 for w/c follow PT Short Term Goal 2 - Progress (Week 1): Met PT Short Term Goal 3 (Week 1): Pt will demonstrate static sitting balance without back support x5 minutes with supervision PT Short Term Goal 3 - Progress (Week 1): Not met PT Short Term Goal 4 (Week 1): Pt will  initiate stair training with PT PT Short Term Goal 4 - Progress (Week 1): Not met Week 2:  PT Short Term Goal 1 (Week 2): Pt demonstrate static sitting balance with LUE support x3 minutes with supervision.  PT Short Term Goal 2 (Week 2): Pt will initiate stair training for NMR and strengthening PT Short Term Goal 3 (Week 2): Pt will propel w/c x50' with min assist   Skilled Therapeutic Interventions/Progress Updates:    No c/o pain and family in room on arrival.  Therapy session focused on activity tolerance, transfers, and gait training for NMR.  Pt performed supine>sit with min assist.  Pt transferred squat/pivot to w/c on R to L with mod assist.  Pt donned pants max assist and shirt mod assist in w/c using sit>stand to pull pants over hips.  Pt propelled w/c with hemi technique and mod assist from room to therapy gym.  Gait training x30' with rail on L side and max assist.  PT on R facilitating R leg movement and upright posture for proprioceptive feedback and NMR.  Pt returned to room in w/c with call bell in reach and needs met.   Therapy Documentation Precautions:  Precautions Precautions: Fall Precaution Comments: dense R hemiparesis Restrictions Weight Bearing Restrictions: No   See Function Navigator for Current Functional Status.  Therapy/Group: Individual Therapy  Earnest Conroy Penven-Crew 06/02/2016, 12:25 PM

## 2016-06-02 NOTE — Progress Notes (Signed)
Physical Therapy Session Note  Patient Details  Name: Paige Stewart MRN: [id number] Date of Birth: [birth year]  Today's Date: 06/02/2016 PT Individual Time: 1600-1630 PT Individual Time Calculation (min): 30 min   Short Term Goals: Week 2:  PT Short Term Goal 1 (Week 2): Pt demonstrate static sitting balance with LUE support x3 minutes with supervision.  PT Short Term Goal 2 (Week 2): Pt will initiate stair training for NMR and strengthening PT Short Term Goal 3 (Week 2): Pt will propel w/c x50' with min assist   Skilled Therapeutic Interventions/Progress Updates:    pt in bed on arrival, no c/o pain.  Session focus on bed mobility, sitting balance/tolerance, transfers, and gait training for proprioceptive feedback and NMR.  Pt supine>sit with mod assist to R side, sitting balance EOB with assist from mod fade to min guard with mod multimodal cues for L weight shift to maintain balance point.  Pt transferred to w/c on L with max assist and verbal cues to reach and lift bottom.  Gait training with rail on L, PT facilitating RLE movement, postural control, hip extension, and weight shift R.  Pt ambulates 28' in 5.27 minutes (0.086 ft/sec).  Pt returned to room in w/c, lap tray in place, call bell in reach and needs met.   Therapy Documentation Precautions:  Precautions Precautions: Fall Precaution Comments: dense R hemiparesis Restrictions Weight Bearing Restrictions: No   See Function Navigator for Current Functional Status.   Therapy/Group: Individual Therapy  Earnest Conroy Penven-Crew 06/02/2016, 6:02 PM

## 2016-06-02 NOTE — Progress Notes (Signed)
Occupational Therapy Weekly Progress Note  Patient Details  Name: Paige Stewart MRN: [id number] Date of Birth: 22-Aug-1950  Beginning of progress report period: May 26, 2016 End of progress report period: June 02, 2016  Today's Date: 06/02/2016 OT Individual Time: 1100-1203 OT Individual Time Calculation (min): 63 min    Patient has met 2 of 3 short term goals.Pt is making slow progress with OT treatments, but has consistently shown progress with ADL participation in the last 2 days. She is able to complete scooting transfers to her L with mod/Max and is progressing with sit<>stands. Pt is initiated more in her bathing/dressing/self-care tasks, but requires assistance 2/2 dense R hemiplegia.   Patient continues to demonstrate the following deficits: muscle weakness, abnormal tone, unbalanced muscle activation, decreased coordination and decreased motor planning, decreased attention to right, decreased initiation, decreased attention, decreased awareness, decreased problem solving, decreased safety awareness, decreased memory and delayed processing and decreased sitting balance, decreased standing balance, decreased postural control, hemiplegia and decreased balance strategies and therefore will continue to benefit from skilled OT intervention to enhance overall performance with BADL and Reduce care partner burden.  Patient progressing toward long term goals..  Continue plan of care.  OT Short Term Goals Week 1:  OT Short Term Goal 1 (Week 1): Pt will complete toilet transfer with Max A +1 OT Short Term Goal 1 - Progress (Week 1): Met OT Short Term Goal 2 (Week 1): Pt will complete UB dressing using hemi techniques with Mod A OT Short Term Goal 2 - Progress (Week 1): Progressing toward goal OT Short Term Goal 3 (Week 1): Pt will maintain sitting balance during functional task seated EOB with min A OT Short Term Goal 3 - Progress (Week 1): Met Week 2:  OT Short Term Goal 1 (Week 2): Pt will  dress UB with mod A in order to reduce caregiver burden OT Short Term Goal 2 (Week 2): Pt will recall hemi-dressing techniques 2 consecutive days with min questioning cues OT Short Term Goal 3 (Week 2): Pt will complete sit<>stand at the sink in preparation for ADL tasks with Mod A to decrease caregiver burger    Skilled Therapeutic Interventions/Progress Updates:    OT treatment session focused on toilet transfers, modified grooming tasks, and sitting balance. Pt denied need to void, but given she had not been to the bathroom in 1+ hours, OT utilized Stedy to transfer pt onto raised toilet. Pt required Max A sit<>stand today with mod verbal cues to initiate and power up. Mod A to maintain sitting balance on Stedy 2/2 lateral lean to L. Pt voided bladder successfully. Pt attempted to perform per-care with set-up A, but ws unable to open legs far enough to wip in front, and had difficulty rotating trunk and maintaining balance in order to reach behind. Propelled wc to therapy gym for time management. Lateral transfer to R with total A.  Therapeutic activities focused on lateral weight shifting with manual facilitation to elicit R anterior pelvic tilt + trunk elongation. R UE NMR in sitting with joint input provided to bring pt through full ROM of shoulder/elbow/wrist/hand. Pt completed lateral scooting transfer to return to wc on L with min cues to initiate and Mod A for scoot. Pt left seated in wc at end of session with safety belt on and call bell in lap. Therapy Documentation Precautions:  Precautions Precautions: Fall Precaution Comments: dense R hemiparesis Restrictions Weight Bearing Restrictions: No Pain:  denies pain ADL: ADL ADL Comments: Please see  functional navigator  See Function Navigator for Current Functional Status.   Therapy/Group: Individual Therapy  Valma Cava 06/02/2016, 12:38 PM

## 2016-06-02 NOTE — Progress Notes (Signed)
Subjective/Complaints:  Ate 30-100% yesterday Urinary and bowel incont ROS: Denies CP, SOB, N/V/D.  Objective: Vital Signs: Blood pressure 136/78, pulse 76, temperature 98.6 F (37 C), temperature source Oral, resp. rate 18, height 4' 11" (1.499 m), weight 63.1 kg (139 lb 1.8 oz), SpO2 99 %. No results found. Results for orders placed or performed during the hospital encounter of 05/25/16 (from the past 72 hour(s))  Glucose, capillary     Status: Abnormal   Collection Time: 05/30/16 11:54 AM  Result Value Ref Range   Glucose-Capillary 143 (H) 65 - 99 mg/dL  Glucose, capillary     Status: None   Collection Time: 05/30/16  4:47 PM  Result Value Ref Range   Glucose-Capillary 84 65 - 99 mg/dL  Glucose, capillary     Status: Abnormal   Collection Time: 05/30/16  9:36 PM  Result Value Ref Range   Glucose-Capillary 129 (H) 65 - 99 mg/dL  Basic metabolic panel     Status: Abnormal   Collection Time: 05/31/16  4:30 AM  Result Value Ref Range   Sodium 136 135 - 145 mmol/L   Potassium 4.3 3.5 - 5.1 mmol/L   Chloride 105 101 - 111 mmol/L   CO2 23 22 - 32 mmol/L   Glucose, Bld 105 (H) 65 - 99 mg/dL   BUN 37 (H) 6 - 20 mg/dL   Creatinine, Ser 1.16 (H) 0.44 - 1.00 mg/dL   Calcium 9.0 8.9 - 10.3 mg/dL   GFR calc non Af Amer 48 (L) >60 mL/min   GFR calc Af Amer 56 (L) >60 mL/min    Comment: (NOTE) The eGFR has been calculated using the CKD EPI equation. This calculation has not been validated in all clinical situations. eGFR's persistently <60 mL/min signify possible Chronic Kidney Disease.    Anion gap 8 5 - 15  Glucose, capillary     Status: Abnormal   Collection Time: 05/31/16  6:20 AM  Result Value Ref Range   Glucose-Capillary 113 (H) 65 - 99 mg/dL  Glucose, capillary     Status: Abnormal   Collection Time: 05/31/16 11:37 AM  Result Value Ref Range   Glucose-Capillary 152 (H) 65 - 99 mg/dL  Glucose, capillary     Status: None   Collection Time: 05/31/16  4:56 PM  Result  Value Ref Range   Glucose-Capillary 83 65 - 99 mg/dL  Glucose, capillary     Status: Abnormal   Collection Time: 05/31/16 10:05 PM  Result Value Ref Range   Glucose-Capillary 129 (H) 65 - 99 mg/dL  Glucose, capillary     Status: Abnormal   Collection Time: 06/01/16  6:51 AM  Result Value Ref Range   Glucose-Capillary 129 (H) 65 - 99 mg/dL  Glucose, capillary     Status: Abnormal   Collection Time: 06/01/16 12:25 PM  Result Value Ref Range   Glucose-Capillary 121 (H) 65 - 99 mg/dL     HEENT: Normocephalic, atraumatic Cardio: RRR. No JVD. Resp: CTA B/L and unlaboured GI: BS positive and ND Skin:   Intact. Warm and dry. Neuro: Flat,  Motor 0 /5 in RUE and Trace hip/knee ext synergy RLE Musc/Skel:  No edema. No tenderness. Gen NAD. Vital signs reviewed.   Assessment/Plan: 1. Functional deficits secondary to Basal ganglia infarct which require 3+ hours per day of interdisciplinary therapy in a comprehensive inpatient rehab setting. Physiatrist is providing close team supervision and 24 hour management of active medical problems listed below. Physiatrist and rehab team continue  to assess barriers to discharge/monitor patient progress toward functional and medical goals. FIM: Function - Bathing Position: Wheelchair/chair at sink Body parts bathed by patient: Right arm, Chest, Abdomen, Front perineal area, Right upper leg, Left upper leg Body parts bathed by helper: Left arm, Buttocks, Right lower leg, Back Assist Level:  (Mod A)  Function- Upper Body Dressing/Undressing What is the patient wearing?: Pull over shirt/dress Pull over shirt/dress - Perfomed by patient: Thread/unthread left sleeve Pull over shirt/dress - Perfomed by helper: Thread/unthread right sleeve, Pull shirt over trunk, Put head through opening Button up shirt - Perfomed by patient: Thread/unthread right sleeve Button up shirt - Perfomed by helper: Thread/unthread left sleeve, Button/unbutton shirt, Pull shirt  around back Assist Level:  (Max A) Function - Lower Body Dressing/Undressing What is the patient wearing?: Pants, Socks Position: Wheelchair/chair at sink Pants- Performed by patient: Pull pants up/down Pants- Performed by helper: Thread/unthread right pants leg, Thread/unthread left pants leg, Pull pants up/down Non-skid slipper socks- Performed by helper: Don/doff right sock, Don/doff left sock Assist for footwear: Dependant Assist for lower body dressing:  (Total A)  Function - Toileting Toileting activity did not occur: No continent bowel/bladder event Toileting steps completed by helper: Adjust clothing prior to toileting, Performs perineal hygiene, Adjust clothing after toileting Assist level: Two helpers  Function - Air cabin crew transfer assistive device: Drop arm commode Assist level to toilet: Total assist (Pt < 25%) Assist level from toilet: Total assist (Pt < 25%)  Function - Chair/bed transfer Chair/bed transfer method: Squat pivot, Lateral scoot Chair/bed transfer assist level: Maximal assist (Pt 25 - 49%/lift and lower) Chair/bed transfer assistive device: Bedrails, Armrests Chair/bed transfer details: Verbal cues for precautions/safety, Verbal cues for safe use of DME/AE, Tactile cues for placement, Manual facilitation for placement, Manual facilitation for weight shifting  Function - Locomotion: Wheelchair Will patient use wheelchair at discharge?: Yes Type: Manual Max wheelchair distance: 150 Assist Level: Dependent (Pt equals 0%) Assist Level: Dependent (Pt equals 0%) Assist Level: Dependent (Pt equals 0%) Turns around,maneuvers to table,bed, and toilet,negotiates 3% grade,maneuvers on rugs and over doorsills: No Function - Locomotion: Ambulation Assistive device: Rail in hallway Max distance: 10 Assist level: 2 helpers (total for gait, +2 for w/c follow) Assist level: 2 helpers Walk 50 feet with 2 turns activity did not occur: Safety/medical  concerns Walk 150 feet activity did not occur: Safety/medical concerns Walk 10 feet on uneven surfaces activity did not occur: Safety/medical concerns  Function - Comprehension Comprehension: Auditory Comprehension assist level: Understands basic 50 - 74% of the time/ requires cueing 25 - 49% of the time  Function - Expression Expression: Verbal Expression assist level: Expresses basis less than 25% of the time/requires cueing >75% of the time.  Function - Social Interaction Social Interaction assist level: Interacts appropriately 50 - 74% of the time - May be physically or verbally inappropriate.  Function - Problem Solving Problem solving assist level: Solves basic 25 - 49% of the time - needs direction more than half the time to initiate, plan or complete simple activities  Function - Memory Memory assist level: Recognizes or recalls less than 25% of the time/requires cueing greater than 75% of the time Patient normally able to recall (first 3 days only): That he or she is in a hospital  Medical Problem List and Plan: 1.  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  DVT Prophylaxis/Anticoagulation: Pharmaceutical: Lovenox,  Vascular  study with small right baker's cyst, otherwise unremarkable 3. Pain Management: Added Sportscreme for back pain.  4. Mood: LCSW to follow for evaluation and support.  5. Neuropsych: This patient is not capable of making decisions on her own behalf. 6. Skin/Wound Care: pressure relief measures. 7. Fluids/Electrolytes/Nutrition: Monitor I/O.  8. HTN: monitor BP bid--continue HCTZ.   Controlled 4/11,off hctz now on toprol xl 151m Vitals:   06/01/16 1600 06/02/16 0439  BP: 128/72 136/78  Pulse: 75 76  Resp: 18 18  Temp: 98.9 F (37.2 C) 98.6 F (37 C)    9. Hypokalemia: added supplement.  K+ 4.3 on 4/11  11. NSTEMI: Will need follow up NUC study in the future.  12. T2DM  with peripheral neuropathy: Monitor BS ac/hs. Hgb A1c- 10.3-poorly controlled.  Amaryl 244m Currently controlled  CBG (last 3)   Recent Labs  05/31/16 2205 06/01/16 0651 06/01/16 1225  GLUCAP 129* 129* 121*   13.  ARF- prerenal azotemia  cont .45 NS qhs, EF 45% no signs overload 4/13   LOS (Days) 8 A FACE TO FACE EVALUATION WAS PERFORMED  KIRSTEINS,ANDREW E 06/02/2016, 7:56 AM

## 2016-06-03 ENCOUNTER — Inpatient Hospital Stay (HOSPITAL_COMMUNITY): Payer: No Typology Code available for payment source | Admitting: Occupational Therapy

## 2016-06-03 ENCOUNTER — Inpatient Hospital Stay (HOSPITAL_COMMUNITY): Payer: No Typology Code available for payment source | Admitting: Speech Pathology

## 2016-06-03 DIAGNOSIS — R7989 Other specified abnormal findings of blood chemistry: Secondary | ICD-10-CM

## 2016-06-03 LAB — GLUCOSE, CAPILLARY
GLUCOSE-CAPILLARY: 106 mg/dL — AB (ref 65–99)
GLUCOSE-CAPILLARY: 134 mg/dL — AB (ref 65–99)
GLUCOSE-CAPILLARY: 122 mg/dL — AB (ref 65–99)
GLUCOSE-CAPILLARY: 158 mg/dL — AB (ref 65–99)
GLUCOSE-CAPILLARY: 180 mg/dL — AB (ref 65–99)
Glucose-Capillary: 168 mg/dL — ABNORMAL HIGH (ref 65–99)
Glucose-Capillary: 220 mg/dL — ABNORMAL HIGH (ref 65–99)
Glucose-Capillary: 116 mg/dL — ABNORMAL HIGH (ref 65–99)

## 2016-06-03 NOTE — Progress Notes (Signed)
Occupational Therapy Session Note  Patient Details  Name: Paige Stewart MRN: [id number] Date of Birth: Aug 19, 1950  Today's Date: 06/03/2016 OT Individual Time: 1300-1345 OT Individual Time Calculation (min): 45 min    Short Term Goals: Week 2:  OT Short Term Goal 1 (Week 2): Pt will dress UB with mod A in order to reduce caregiver burden OT Short Term Goal 2 (Week 2): Pt will recall hemi-dressing techniques 2 consecutive days with min questioning cues OT Short Term Goal 3 (Week 2): Pt will complete sit<>stand at the sink in preparation for ADL tasks with Mod A to decrease caregiver burger  Skilled Therapeutic Interventions/Progress Updates: Patient primarily participated in neuro reeducation this session.  Initially she required max A to maintain balance sitting edge of bed, but after approx 30 minutes treatment (thoracic extension, pelvic posterior & lateral tilts, postural control, trunk strengthening & stability & rotation, etc), she was able to sit upright maintaining balance simply supporting herself with her right left UE and bilateral LEs on floor for approximately 10 seconds without a rest break.  At the end of the session, the patient asked to ly down to rest and was assisted with sidelying hemi positioning on her left side with call bell in place.     Therapy Documentation Precautions:  Precautions Precautions: Fall Precaution Comments: dense R hemiparesis Restrictions Weight Bearing Restrictions: No  Pain:denied    See Function Navigator for Current Functional Status.   Therapy/Group: Individual Therapy  Bud Face [facility] 06/03/2016, 7:57 PM

## 2016-06-03 NOTE — Progress Notes (Signed)
Occupational Therapy Session Note  Patient Details  Name: Paige Stewart MRN: [id number] Date of Birth: 03/22/1950  Today's Date: 06/03/2016 OT Individual Time: 7829-5621 OT Individual Time Calculation (min): 42 min    Skilled Therapeutic Interventions/Progress Updates: Skilled Ot session completed with focus on R UE NMR, functional transfers, and adaptive dressing skills. Pt was lying in bed at time of arrival, agreeable to session. Squat pivot from EOB>w/c completed with Max A. Once in w/c, pt reported wanting to complete grooming tasks and oral care at sink. OT facilitated R UE as gross stabilizer during all tasks, pt often trying to use one handed methods or using mouth to bite as needed. Pt educated on using R UE during these tasks to regain lost function. Due to soiled shirt, pt donned a new one. She was able to doff without physical assist. Though pt is provided with hemi technique education, she tries to complete UB dressing in different ways. OT provided time for pt to self correct and problem solve. With hemi technique, pt was able to complete 3/4 components. At end of session pt was left in w/c with half lap tray and all needs. She refused to wear safety belt. Pt verbalized that she would not get up on her own. RN made aware.      Therapy Documentation Precautions:  Precautions Precautions: Fall Precaution Comments: dense R hemiparesis Restrictions Weight Bearing Restrictions: No   Pain: Min c/o R UE pain when using it functionally, subsided with rest    ADL: ADL ADL Comments: Please see functional navigator    See Function Navigator for Current Functional Status.   Therapy/Group: Individual Therapy  Thecla Forgione A Myrical Andujo 06/03/2016, 4:23 PM

## 2016-06-03 NOTE — Progress Notes (Signed)
Speech Language Pathology Daily Session Note  Patient Details  Name: Paige Stewart MRN: [id number] Date of Birth: 11-07-1950  Today's Date: 06/03/2016 SLP Individual Time: 0930-1015 SLP Individual Time Calculation (min): 45 min  Short Term Goals: Week 2: SLP Short Term Goal 1 (Week 2): Patient will consume current diet with minimal overt s/s of aspiration with Mod A verbal cues needed for use of swallowing compensatory strategies.  SLP Short Term Goal 2 (Week 2): Patient will utilize chin tuck with trials of thin liquids without overt s/s of aspiration in 75% of trials with Mod A verbal cues over 3 sessions prior to upgrade.  SLP Short Term Goal 3 (Week 2): Patient will utilize an increased vocal intensity at the word and phrase level to achieve ~75% intelligibility with Mod A verbal cues.  SLP Short Term Goal 4 (Week 2): Patient will self-monitor and correct verbal errors at the phrase level with Max A multimodal cues.  SLP Short Term Goal 5 (Week 2): Patient will attend to right field of enviornment during functional tasks with Mod A multimodal cues. SLP Short Term Goal 6 (Week 2): Patient will demonstrate functional problem solving for basic and familiar tasks with Mod A verbal cues.   Skilled Therapeutic Interventions: Skilled treatment session focused on dysphagia and speech intelligibility goals. SLP facilitated session by providing Mod A for use of chin tuck when consuming single ice chips. With these Mod A cues, pt able to hold chin tuck but had throat clear with each trials. Pt required Mod A to Max cues for use of speech intelligibility strategies at the phrase level to achieve ~ 50% intelligibility. Pt left upright in bed, bed alarm on and all needs within reach. Continue per current plan of care.      Function:  Eating Eating   Modified Consistency Diet: No (Ice chips with SLP only)             Cognition Comprehension Comprehension assist level: Understands basic 50 - 74%  of the time/ requires cueing 25 - 49% of the time  Expression   Expression assist level: Expresses basis less than 25% of the time/requires cueing >75% of the time.  Social Interaction Social Interaction assist level: Interacts appropriately 50 - 74% of the time - May be physically or verbally inappropriate.  Problem Solving Problem solving assist level: Solves basic 25 - 49% of the time - needs direction more than half the time to initiate, plan or complete simple activities  Memory Memory assist level: Recognizes or recalls less than 25% of the time/requires cueing greater than 75% of the time    Pain    Therapy/Group: Individual Therapy   Keyry Iracheta B. Dreama Saa, [name], [name]   Ellyssa Zagal 06/03/2016, 10:08 AM

## 2016-06-03 NOTE — Progress Notes (Signed)
Subjective/Complaints:  No distress. Pointing to right arm alluding to the fact that it won't move  ROS: Limited due to language.  Objective: Vital Signs: Blood pressure 132/77, pulse 77, temperature 98 F (36.7 C), temperature source Oral, resp. rate 18, height  (1.499 m), weight 63.1 kg (139 lb 1.8 oz), SpO2 100 %. No results found. Results for orders placed or performed during the hospital encounter of 05/25/16 (from the past 72 hour(s))  Glucose, capillary     Status: Abnormal   Collection Time: 05/31/16 11:37 AM  Result Value Ref Range   Glucose-Capillary 152 (H) 65 - 99 mg/dL  Glucose, capillary     Status: None   Collection Time: 05/31/16  4:56 PM  Result Value Ref Range   Glucose-Capillary 83 65 - 99 mg/dL  Glucose, capillary     Status: Abnormal   Collection Time: 05/31/16 10:05 PM  Result Value Ref Range   Glucose-Capillary 129 (H) 65 - 99 mg/dL  Glucose, capillary     Status: Abnormal   Collection Time: 06/01/16  6:51 AM  Result Value Ref Range   Glucose-Capillary 129 (H) 65 - 99 mg/dL  Glucose, capillary     Status: Abnormal   Collection Time: 06/01/16 12:25 PM  Result Value Ref Range   Glucose-Capillary 121 (H) 65 - 99 mg/dL     HEENT: Normocephalic, atraumatic Cardio:RRR. Resp: CTA bilaterally. Normal effort GI: BS positive and ND Skin:   Intact. Warm and dry. Neuro: Flat, word finding deficits.  Motor 0 /5 in RUE and Trace hip/knee ext synergy RLE Musc/Skel:  No edema. No tenderness. Gen NAD. Vital signs reviewed.   Assessment/Plan: 1. Functional deficits secondary to Basal ganglia infarct which require 3+ hours per day of interdisciplinary therapy in a comprehensive inpatient rehab setting. Physiatrist is providing close team supervision and 24 hour management of active medical problems listed below. Physiatrist and rehab team continue to assess barriers to discharge/monitor patient progress toward functional and medical goals. FIM: Function  - Bathing Position: Wheelchair/chair at sink Body parts bathed by patient: Right arm, Chest, Abdomen, Front perineal area, Right upper leg, Left upper leg Body parts bathed by helper: Left arm, Buttocks, Right lower leg, Back Assist Level:  (Mod A)  Function- Upper Body Dressing/Undressing What is the patient wearing?: Pull over shirt/dress Pull over shirt/dress - Perfomed by patient: Thread/unthread left sleeve Pull over shirt/dress - Perfomed by helper: Thread/unthread right sleeve, Pull shirt over trunk, Put head through opening Button up shirt - Perfomed by patient: Thread/unthread right sleeve Button up shirt - Perfomed by helper: Thread/unthread left sleeve, Button/unbutton shirt, Pull shirt around back Assist Level:  (Max A) Function - Lower Body Dressing/Undressing What is the patient wearing?: Pants, Socks Position: Wheelchair/chair at sink Pants- Performed by patient: Pull pants up/down Pants- Performed by helper: Thread/unthread right pants leg, Thread/unthread left pants leg, Pull pants up/down Non-skid slipper socks- Performed by helper: Don/doff right sock, Don/doff left sock Assist for footwear: Dependant Assist for lower body dressing:  (Total A)  Function - Toileting Toileting activity did not occur: No continent bowel/bladder event Toileting steps completed by helper: Adjust clothing prior to toileting, Performs perineal hygiene, Adjust clothing after toileting Assist level: Two helpers  Function - Archivist transfer assistive device: Drop arm commode Assist level to toilet: Total assist (Pt < 25%) Assist level from toilet: Total assist (Pt < 25%)  Function - Chair/bed transfer Chair/bed transfer method: Squat pivot, Lateral scoot Chair/bed transfer assist level: Maximal  assist (Pt 25 - 49%/lift and lower) Chair/bed transfer assistive device: Bedrails, Armrests Chair/bed transfer details: Verbal cues for precautions/safety, Verbal cues for safe use  of DME/AE, Tactile cues for placement, Manual facilitation for placement, Manual facilitation for weight shifting  Function - Locomotion: Wheelchair Will patient use wheelchair at discharge?: Yes Type: Manual Max wheelchair distance: 150 Assist Level: Dependent (Pt equals 0%) Assist Level: Dependent (Pt equals 0%) Assist Level: Dependent (Pt equals 0%) Turns around,maneuvers to table,bed, and toilet,negotiates 3% grade,maneuvers on rugs and over doorsills: No Function - Locomotion: Ambulation Assistive device: Rail in hallway Max distance: 10 Assist level: 2 helpers (total for gait, +2 for w/c follow) Assist level: 2 helpers Walk 50 feet with 2 turns activity did not occur: Safety/medical concerns Walk 150 feet activity did not occur: Safety/medical concerns Walk 10 feet on uneven surfaces activity did not occur: Safety/medical concerns  Function - Comprehension Comprehension: Auditory Comprehension assist level: Understands basic 50 - 74% of the time/ requires cueing 25 - 49% of the time  Function - Expression Expression: Verbal Expression assist level: Expresses basis less than 25% of the time/requires cueing >75% of the time.  Function - Social Interaction Social Interaction assist level: Interacts appropriately 50 - 74% of the time - May be physically or verbally inappropriate.  Function - Problem Solving Problem solving assist level: Solves basic 25 - 49% of the time - needs direction more than half the time to initiate, plan or complete simple activities  Function - Memory Memory assist level: Recognizes or recalls less than 25% of the time/requires cueing greater than 75% of the time Patient normally able to recall (first 3 days only): That he or she is in a hospital  Medical Problem List and Plan: 1.  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  DVT Prophylaxis/Anticoagulation: Pharmaceutical: Lovenox,     -Vascular study with small right baker's cyst, otherwise unremarkable 3. Pain Management: Added Sportscreme for back pain.  4. Mood: LCSW to follow for evaluation and support  -paxil trial for depression.  5. Neuropsych: This patient is not capable of making decisions on her own behalf. 6. Skin/Wound Care: pressure relief measures. 7. Fluids/Electrolytes/Nutrition: Monitor I/O.  8. HTN: monitor BP bid--continue HCTZ.   Controlled 4/11,off hctz now on toprol xl  Vitals:   06/02/16 1500 06/03/16 0446  BP: 126/78 132/77  Pulse: 75 77  Resp: 18 18  Temp: 98.2 F (36.8 C) 98 F (36.7 C)    9. Hypokalemia: added supplement.  K+ 4.3 on 4/11  11. NSTEMI: Will need follow up NUC study in the future.  12. T2DM with peripheral neuropathy: Monitor BS ac/hs. Hgb A1c- 10.3-poorly controlled.  Amaryl . C  -good control CBG (last 3)   Recent Labs  05/31/16 2205 06/01/16 0651 06/01/16 1225  GLUCAP 129* 129* 121*   13.  ARF- prerenal azotemia  cont .45 NS qhs, EF 45% no signs overload, weights stable around 63kg  -follow up labs monday  LOS (Days) 9 A FACE TO FACE EVALUATION WAS PERFORMED  Paige Stewart T 06/03/2016, 9:15 AM

## 2016-06-04 ENCOUNTER — Inpatient Hospital Stay (HOSPITAL_COMMUNITY): Payer: No Typology Code available for payment source | Admitting: Occupational Therapy

## 2016-06-04 ENCOUNTER — Inpatient Hospital Stay (HOSPITAL_COMMUNITY): Payer: No Typology Code available for payment source | Admitting: Physical Therapy

## 2016-06-04 LAB — GLUCOSE, CAPILLARY
GLUCOSE-CAPILLARY: 220 mg/dL — AB (ref 65–99)
GLUCOSE-CAPILLARY: 137 mg/dL — AB (ref 65–99)
Glucose-Capillary: 94 mg/dL (ref 65–99)

## 2016-06-04 MED ORDER — GLIMEPIRIDE 2 MG PO TABS
3.0000 mg | ORAL_TABLET | Freq: Every day | ORAL | Status: DC
  Administered 2016-06-05 – 2016-06-14 (×10): 3 mg via ORAL
  Filled 2016-06-04 (×10): qty 2

## 2016-06-04 NOTE — Progress Notes (Signed)
Subjective/Complaints:  Slept without problems. Appears comfortable  ROS: Limited due cognitive/linguistic   Objective: Vital Signs: Blood pressure (!) 159/88, pulse 68, temperature 98.5 F (36.9 C), temperature source Oral, resp. rate 18, height  (1.499 m), weight 63.1 kg (139 lb 1.8 oz), SpO2 100 %. No results found. Results for orders placed or performed during the hospital encounter of 05/25/16 (from the past 72 hour(s))  Glucose, capillary     Status: Abnormal   Collection Time: 06/01/16 12:25 PM  Result Value Ref Range   Glucose-Capillary 121 (H) 65 - 99 mg/dL  Glucose, capillary     Status: Abnormal   Collection Time: 06/01/16  4:54 PM  Result Value Ref Range   Glucose-Capillary 106 (H) 65 - 99 mg/dL  Glucose, capillary     Status: Abnormal   Collection Time: 06/01/16 10:03 PM  Result Value Ref Range   Glucose-Capillary 134 (H) 65 - 99 mg/dL  Glucose, capillary     Status: Abnormal   Collection Time: 06/02/16  6:27 AM  Result Value Ref Range   Glucose-Capillary 122 (H) 65 - 99 mg/dL  Glucose, capillary     Status: Abnormal   Collection Time: 06/02/16 12:14 PM  Result Value Ref Range   Glucose-Capillary 158 (H) 65 - 99 mg/dL  Glucose, capillary     Status: Abnormal   Collection Time: 06/02/16  4:39 PM  Result Value Ref Range   Glucose-Capillary 168 (H) 65 - 99 mg/dL  Glucose, capillary     Status: Abnormal   Collection Time: 06/02/16  8:41 PM  Result Value Ref Range   Glucose-Capillary 220 (H) 65 - 99 mg/dL  Glucose, capillary     Status: Abnormal   Collection Time: 06/03/16  6:45 AM  Result Value Ref Range   Glucose-Capillary 116 (H) 65 - 99 mg/dL  Glucose, capillary     Status: Abnormal   Collection Time: 06/03/16 11:57 AM  Result Value Ref Range   Glucose-Capillary 180 (H) 65 - 99 mg/dL     HEENT: Normocephalic, atraumatic Cardio:RRR. Resp: CTA bilaterally. Normal effort GI: BS positive and ND Skin:   Intact. Warm and dry. Neuro: Flat, word  finding deficits.  Motor 0 /5 in RUE and Trace hip/knee ext synergy RLE Musc/Skel:  No edema. No tenderness. Gen NAD. Vital signs reviewed.   Assessment/Plan: 1. Functional deficits secondary to Basal ganglia infarct which require 3+ hours per day of interdisciplinary therapy in a comprehensive inpatient rehab setting. Physiatrist is providing close team supervision and 24 hour management of active medical problems listed below. Physiatrist and rehab team continue to assess barriers to discharge/monitor patient progress toward functional and medical goals. FIM: Function - Bathing Position: Wheelchair/chair at sink Body parts bathed by patient: Right arm, Chest, Abdomen, Front perineal area, Right upper leg, Left upper leg Body parts bathed by helper: Left arm, Buttocks, Right lower leg, Back Assist Level:  (Mod A)  Function- Upper Body Dressing/Undressing What is the patient wearing?: Pull over shirt/dress Pull over shirt/dress - Perfomed by patient: Put head through opening, Pull shirt over trunk, Thread/unthread left sleeve Pull over shirt/dress - Perfomed by helper: Thread/unthread right sleeve Button up shirt - Perfomed by patient: Thread/unthread right sleeve Button up shirt - Perfomed by helper: Thread/unthread left sleeve, Button/unbutton shirt, Pull shirt around back Assist Level:  (Min-Mod A) Function - Lower Body Dressing/Undressing What is the patient wearing?: Pants, Socks Position: Wheelchair/chair at sink Pants- Performed by patient: Pull pants up/down Pants- Performed by helper:  Thread/unthread right pants leg, Thread/unthread left pants leg, Pull pants up/down Non-skid slipper socks- Performed by helper: Don/doff right sock, Don/doff left sock Assist for footwear: Dependant Assist for lower body dressing:  (Total A)  Function - Toileting Toileting activity did not occur: No continent bowel/bladder event Toileting steps completed by patient: Adjust clothing prior to  toileting Toileting steps completed by helper: Adjust clothing prior to toileting, Performs perineal hygiene, Adjust clothing after toileting Assist level: Two helpers  Function - Archivist transfer activity did not occur:  (not attempted) Toilet transfer assistive device: Drop arm commode Assist level to toilet: Total assist (Pt < 25%) Assist level from toilet: Total assist (Pt < 25%)  Function - Chair/bed transfer Chair/bed transfer method: Squat pivot Chair/bed transfer assist level: Maximal assist (Pt 25 - 49%/lift and lower) Chair/bed transfer assistive device: Bedrails, Armrests Chair/bed transfer details: Verbal cues for precautions/safety, Verbal cues for safe use of DME/AE, Tactile cues for placement, Manual facilitation for placement, Manual facilitation for weight shifting  Function - Locomotion: Wheelchair Will patient use wheelchair at discharge?: Yes Type: Manual Max wheelchair distance: 150 Assist Level: Dependent (Pt equals 0%) Assist Level: Dependent (Pt equals 0%) Assist Level: Dependent (Pt equals 0%) Turns around,maneuvers to table,bed, and toilet,negotiates 3% grade,maneuvers on rugs and over doorsills: No Function - Locomotion: Ambulation Assistive device: Rail in hallway Max distance: 10 Assist level: 2 helpers (total for gait, +2 for w/c follow) Assist level: 2 helpers Walk 50 feet with 2 turns activity did not occur: Safety/medical concerns Walk 150 feet activity did not occur: Safety/medical concerns Walk 10 feet on uneven surfaces activity did not occur: Safety/medical concerns  Function - Comprehension Comprehension: Auditory Comprehension assist level: Understands basic 50 - 74% of the time/ requires cueing 25 - 49% of the time  Function - Expression Expression: Verbal Expression assist level: Expresses basis less than 25% of the time/requires cueing >75% of the time.  Function - Social Interaction Social Interaction assist level:  Interacts appropriately 50 - 74% of the time - May be physically or verbally inappropriate.  Function - Problem Solving Problem solving assist level: Solves basic 25 - 49% of the time - needs direction more than half the time to initiate, plan or complete simple activities  Function - Memory Memory assist level: Recognizes or recalls less than 25% of the time/requires cueing greater than 75% of the time Patient normally able to recall (first 3 days only): That he or she is in a hospital  Medical Problem List and Plan: 1.  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  DVT Prophylaxis/Anticoagulation: Pharmaceutical: Lovenox,    -Vascular study with small right baker's cyst, otherwise unremarkable 3. Pain Management: Added Sportscreme for back pain.  4. Mood: LCSW to follow for evaluation and support  -paxil trial for depression.  5. Neuropsych: This patient is not capable of making decisions on her own behalf. 6. Skin/Wound Care: pressure relief measures. 7. Fluids/Electrolytes/Nutrition: Monitor I/O.  8. HTN: monitor BP bid--continue HCTZ.   Controlled 4/11,off hctz now on toprol xl  Vitals:   06/04/16 0500 06/04/16 0745  BP: 132/70 (!) 159/88  Pulse: 73 68  Resp: 18   Temp: 98.5 F (36.9 C)     9. Hypokalemia: added supplement.  K+ 4.3 on 4/11  11. NSTEMI: Will need follow up NUC study in the future.  12. T2DM with peripheral neuropathy: Monitor BS ac/hs. Hgb A1c-  10.3- .      -some elevation over last 36 hours  -increase amaryl to  CBG (last 3)   Recent Labs  06/02/16 2041 06/03/16 0645 06/03/16 1157  GLUCAP 220* 116* 180*   13.  ARF- prerenal azotemia  cont .45 NS qhs, EF 45% no signs overload, weights stable around 63kg  -follow up labs monday  LOS (Days) 10 A FACE TO FACE EVALUATION WAS PERFORMED  Alexzandria Massman T 06/04/2016, 9:47 AM

## 2016-06-04 NOTE — Progress Notes (Signed)
Physical Therapy Note  Patient Details  Name: Paige Stewart MRN: [id number] Date of Birth: 1950/04/21 Today's Date: 06/04/2016    Time: 1100-1144 44 minutes  1:1 No c/o pain. Squat pivot transfers throughout session with max A, manual facilitation for wt shifts, cues for UE and LE placement for safety.  Standing balance with clothespin task with static balance with mod A, facilitation for Rt hip and knee control.  Squat and reach task in standing with max A for squatting and returning to upright for Rt hip and knee control as well as trunk extension.  Nu step for NMR x 5 minutes level 3 initially with assist for Rt LE, progressing to pt able to pedal with min cues.   DONAWERTH,KAREN 06/04/2016, 11:42 AM

## 2016-06-04 NOTE — Progress Notes (Signed)
Occupational Therapy Session Note  Patient Details  Name: Paige Stewart MRN: [id number] Date of Birth: [birth year]  Today's Date: 06/04/2016 OT Individual Time:  -  0800-0900  (60 min)  1st session                                         1330-1415  (45 min)   2nd session      Short Term Goals: Week 1:  OT Short Term Goal 1 (Week 1): Pt will complete toilet transfer with Max A +1 OT Short Term Goal 1 - Progress (Week 1): Met OT Short Term Goal 2 (Week 1): Pt will complete UB dressing using hemi techniques with Mod A OT Short Term Goal 2 - Progress (Week 1): Progressing toward goal OT Short Term Goal 3 (Week 1): Pt will maintain sitting balance during functional task seated EOB with min A OT Short Term Goal 3 - Progress (Week 1): Met Week 2:  OT Short Term Goal 1 (Week 2): Pt will dress UB with mod A in order to reduce caregiver burden OT Short Term Goal 2 (Week 2): Pt will recall hemi-dressing techniques 2 consecutive days with min questioning cues OT Short Term Goal 3 (Week 2): Pt will complete sit<>stand at the sink in preparation for ADL tasks with Mod A to decrease caregiver burger Week 3:     Skilled Therapeutic Interventions/Progress Updates:     1st session:  OT treatment session focused on   Bed mobility, transfers, attention to right side, self feeding and safety with swallowing and dressing/bathing.  PPt was mod assist with supine to EOB..  Max assist transfer to wc.  Ppt sat at sink and washed hands and face with assistance mod.  Performed self feeding with mod cues for attenting to food right  on right side of mouth and wiping.  Ppt with some coughing in beginning but improved with chin tuck implementation.  PPt fed self grits and eggs and drank juice and milk.  Ppt dressed self with shirt and pants with max assist .  Performed sit to stand with max assist and stood for 1 min with max assist and postural cues for upright position.    2nd session:  Therapeutic activities focused  on lateral weight shifting and sit to stand.  Provided  manual facilitation to elicit anterior pelvic tilt and forward weight weight shift to go from sit to stand.  Provided max facilitation for UB midline control.  PPt stood for 2 minutes the first time.  . RUE NMR in sitting with joint input provided to bring pt through full ROM of shoulder/elbow/wrist/hand. Pt completed sit to stand for 2 more triails with max assist and stood for 30 seconds.   Pt left seated in wc with family in room.    Therapy Documentation Precautions:  Precautions Precautions: Fall Precaution Comments: dense R hemiparesis Restrictions Weight Bearing Restrictions: No General:   Vital Signs: Therapy Vitals Temp: 98.5 F (36.9 C) Temp Source: Oral Pulse Rate: 68 Resp: 18 BP: (!) 159/88 Patient Position (if appropriate): Lying Oxygen Therapy SpO2: 100 % O2 Device: Not Delivered Pain: none   ADL: ADL ADL Comments: Please see functional navigator Exercises:   Other Treatments:    See Function Navigator for Current Functional Status.   Therapy/Group: Individual Therapy  Lisa Roca 06/04/2016, 7:56 AM

## 2016-06-05 ENCOUNTER — Inpatient Hospital Stay (HOSPITAL_COMMUNITY): Payer: No Typology Code available for payment source | Admitting: Occupational Therapy

## 2016-06-05 ENCOUNTER — Inpatient Hospital Stay (HOSPITAL_COMMUNITY): Payer: No Typology Code available for payment source | Admitting: Speech Pathology

## 2016-06-05 ENCOUNTER — Inpatient Hospital Stay (HOSPITAL_COMMUNITY): Payer: No Typology Code available for payment source

## 2016-06-05 ENCOUNTER — Inpatient Hospital Stay (HOSPITAL_COMMUNITY): Payer: No Typology Code available for payment source | Admitting: Physical Therapy

## 2016-06-05 LAB — GLUCOSE, CAPILLARY
GLUCOSE-CAPILLARY: 158 mg/dL — AB (ref 65–99)
GLUCOSE-CAPILLARY: 230 mg/dL — AB (ref 65–99)
GLUCOSE-CAPILLARY: 128 mg/dL — AB (ref 65–99)
GLUCOSE-CAPILLARY: 241 mg/dL — AB (ref 65–99)
Glucose-Capillary: 101 mg/dL — ABNORMAL HIGH (ref 65–99)

## 2016-06-05 LAB — BASIC METABOLIC PANEL
ANION GAP: 10 (ref 5–15)
BUN: 15 mg/dL (ref 6–20)
CALCIUM: 9.2 mg/dL (ref 8.9–10.3)
CO2: 24 mmol/L (ref 22–32)
Chloride: 105 mmol/L (ref 101–111)
Creatinine, Ser: 0.74 mg/dL (ref 0.44–1.00)
GFR calc non Af Amer: >60 mL/min (ref >60)
GLUCOSE: 136 mg/dL — AB (ref 65–99)
POTASSIUM: 4.2 mmol/L (ref 3.5–5.1)
Sodium: 139 mmol/L (ref 135–145)

## 2016-06-05 NOTE — Progress Notes (Signed)
Occupational Therapy Session Note  Patient Details   Name: Paige Stewart MRN: [id number] Date of Birth: [birth year]  Today's Date: 06/05/2016 OT Individual Time: 0800-0900 OT Individual Time Calculation (min): 60 min    Short Term Goals: Week 2:  OT Short Term Goal 1 (Week 2): Pt will dress UB with mod A in order to reduce caregiver burden OT Short Term Goal 2 (Week 2): Pt will recall hemi-dressing techniques 2 consecutive days with min questioning cues OT Short Term Goal 3 (Week 2): Pt will complete sit<>stand at the sink in preparation for ADL tasks with Mod A to decrease caregiver burger  Skilled Therapeutic Interventions/Progress Updates:    OT treatment session focused on functional transfers, sitting balance, modified bathing/dressing, R attention. Pt greeted OT this am stating " I am ready to work." Bed mobility completed with assistance to advance R LE and R hip and Mod A elevate trunk with VC for hand placement. Squat-pivot transfer > L with Max A. OT transferred pt to toilet via squat-pivot, with total A to weaker L side. Stedy placed under pt to provide LE support 2/2 height of toilet.  Pt voided small amount of urine and completed toileting with facilitation for hip hike, then able to rotate trunk and reach with L UE. Stedy then used to transfer pt off toilet and to shower bench with Mod A and facilitation for trunk and hip extension.  Bathing shower level with total hand-over hand A to integrate R UE in bathing tasks. Pt more attentive to R side requiring min questioning cues to wash R UE and R LE. Stedy used to transfer out of shower with Total A LB dressing, Max A sit<>stand + R knee block, and max A UB dressing 2/2 time management.  Pt left seated in wc with safety belt on and RN entering room to administer meds.   Therapy Documentation Precautions:  Precautions Precautions: Fall Precaution Comments: dense R hemiparesis Restrictions Weight Bearing Restrictions: No Pain: Pain  Assessment Pain Assessment: No/denies pain ADL: ADL ADL Comments: Please see functional navigator  See Function Navigator for Current Functional Status.   Therapy/Group: Individual Therapy  Mal Amabile 06/05/2016, 9:18 AM

## 2016-06-05 NOTE — Progress Notes (Signed)
Speech Language Pathology Daily Session Note  Patient Details  Name: Paige Stewart MRN: [id number] Date of Birth: 1950/10/07  Today's Date: 06/05/2016 SLP Individual Time: 1445-1545 SLP Individual Time Calculation (min): 60 min  Short Term Goals: Week 2: SLP Short Term Goal 1 (Week 2): Patient will consume current diet with minimal overt s/s of aspiration with Mod A verbal cues needed for use of swallowing compensatory strategies.  SLP Short Term Goal 2 (Week 2): Patient will utilize chin tuck with trials of thin liquids without overt s/s of aspiration in 75% of trials with Mod A verbal cues over 3 sessions prior to upgrade.  SLP Short Term Goal 3 (Week 2): Patient will utilize an increased vocal intensity at the word and phrase level to achieve ~75% intelligibility with Mod A verbal cues.  SLP Short Term Goal 4 (Week 2): Patient will self-monitor and correct verbal errors at the phrase level with Max A multimodal cues.  SLP Short Term Goal 5 (Week 2): Patient will attend to right field of enviornment during functional tasks with Mod A multimodal cues. SLP Short Term Goal 6 (Week 2): Patient will demonstrate functional problem solving for basic and familiar tasks with Mod A verbal cues.   Skilled Therapeutic Interventions: Skilled treatment session focused on addressing speech and swallow goals. SLP facilitated session by providing set-up of trials of ice chips with Max assist verbal and visual cues to utilize chin tuck as cues were faded so did chin tuck; however, head neutral positioning resulted in no overt s/s of aspiration.  Patient required Max encouragement to try water via cup which also resulted in no overt s/s of aspiration with singe cup sips.  Recommend observation of water and solids at next visit to determine readiness for advancement.  SLP also facilitated session with Max assist faded to Mod assist verbal cues for self-monitoring of verbal errors at the phrase level.  Continue with  current plan of care.    Function:  Eating Eating   Modified Consistency Diet: No (trials with SLP) Eating Assist Level: Supervision or verbal cues   Eating Set Up Assist For: Opening containers       Cognition Comprehension Comprehension assist level: Understands basic 50 - 74% of the time/ requires cueing 25 - 49% of the time  Expression   Expression assist level: Expresses basis less than 25% of the time/requires cueing >75% of the time.  Social Interaction Social Interaction assist level: Interacts appropriately 50 - 74% of the time - May be physically or verbally inappropriate.  Problem Solving Problem solving assist level: Solves basic 25 - 49% of the time - needs direction more than half the time to initiate, plan or complete simple activities  Memory Memory assist level: Recognizes or recalls less than 25% of the time/requires cueing greater than 75% of the time    Pain Pain Assessment Pain Assessment: No/denies pain  Therapy/Group: Individual Therapy  Charlane Ferretti., CCC-SLP [phone]  Paige Stewart 06/05/2016, 5:18 PM

## 2016-06-05 NOTE — Progress Notes (Signed)
Subjective/Complaints: Smiling, vocal volume stronger ROS: Limited due cognitive/linguistic   Objective: Vital Signs: Blood pressure 135/78, pulse 80, temperature 98 F (36.7 C), temperature source Oral, resp. rate 18, height 4' 11"  (1.499 m), weight 63.1 kg (139 lb 1.8 oz), SpO2 100 %. No results found. Results for orders placed or performed during the hospital encounter of 05/25/16 (from the past 72 hour(s))  Glucose, capillary     Status: Abnormal   Collection Time: 06/02/16 12:14 PM  Result Value Ref Range   Glucose-Capillary 158 (H) 65 - 99 mg/dL  Glucose, capillary     Status: Abnormal   Collection Time: 06/02/16  4:39 PM  Result Value Ref Range   Glucose-Capillary 168 (H) 65 - 99 mg/dL  Glucose, capillary     Status: Abnormal   Collection Time: 06/02/16  8:41 PM  Result Value Ref Range   Glucose-Capillary 220 (H) 65 - 99 mg/dL  Glucose, capillary     Status: Abnormal   Collection Time: 06/03/16  6:45 AM  Result Value Ref Range   Glucose-Capillary 116 (H) 65 - 99 mg/dL  Glucose, capillary     Status: Abnormal   Collection Time: 06/03/16 11:57 AM  Result Value Ref Range   Glucose-Capillary 180 (H) 65 - 99 mg/dL  Glucose, capillary     Status: None   Collection Time: 06/03/16  5:06 PM  Result Value Ref Range   Glucose-Capillary 94 65 - 99 mg/dL  Glucose, capillary     Status: Abnormal   Collection Time: 06/03/16  8:55 PM  Result Value Ref Range   Glucose-Capillary 220 (H) 65 - 99 mg/dL  Glucose, capillary     Status: Abnormal   Collection Time: 06/04/16  6:51 AM  Result Value Ref Range   Glucose-Capillary 137 (H) 65 - 99 mg/dL  Basic metabolic panel     Status: Abnormal   Collection Time: 06/05/16  5:56 AM  Result Value Ref Range   Sodium 139 135 - 145 mmol/L   Potassium 4.2 3.5 - 5.1 mmol/L   Chloride 105 101 - 111 mmol/L   CO2 24 22 - 32 mmol/L   Glucose, Bld 136 (H) 65 - 99 mg/dL   BUN 15 6 - 20 mg/dL   Creatinine, Ser 0.74 0.44 - 1.00 mg/dL   Calcium  9.2 8.9 - 10.3 mg/dL   GFR calc non Af Amer >60 >60 mL/min   GFR calc Af Amer >60 >60 mL/min    Comment: (NOTE) The eGFR has been calculated using the CKD EPI equation. This calculation has not been validated in all clinical situations. eGFR's persistently <60 mL/min signify possible Chronic Kidney Disease.    Anion gap 10 5 - 15     HEENT: Normocephalic, atraumatic Cardio:RRR. Resp: CTA bilaterally. Normal effort GI: BS positive and ND Skin:   Intact. Warm and dry. Neuro: Flat, word finding deficits.  Motor 0 /5 in RUE and 2- hip/knee ext synergy RLE Musc/Skel:  No edema. No tenderness. Gen NAD. Vital signs reviewed.   Assessment/Plan: 1. Functional deficits secondary to Basal ganglia infarct which require 3+ hours per day of interdisciplinary therapy in a comprehensive inpatient rehab setting. Physiatrist is providing close team supervision and 24 hour management of active medical problems listed below. Physiatrist and rehab team continue to assess barriers to discharge/monitor patient progress toward functional and medical goals. FIM: Function - Bathing Position: Wheelchair/chair at sink Body parts bathed by patient: Right arm, Chest, Abdomen, Front perineal area, Right upper leg, Left  upper leg Body parts bathed by helper: Left arm, Buttocks, Right lower leg, Back Assist Level: Touching or steadying assistance(Pt > 75%)  Function- Upper Body Dressing/Undressing What is the patient wearing?: Pull over shirt/dress Pull over shirt/dress - Perfomed by patient: Put head through opening, Pull shirt over trunk, Thread/unthread left sleeve Pull over shirt/dress - Perfomed by helper: Thread/unthread right sleeve Button up shirt - Perfomed by patient: Thread/unthread right sleeve Button up shirt - Perfomed by helper: Thread/unthread left sleeve, Button/unbutton shirt, Pull shirt around back Assist Level:  (Min-Mod A) Function - Lower Body Dressing/Undressing What is the patient  wearing?: Pants, Socks Position: Wheelchair/chair at sink Pants- Performed by patient: Thread/unthread left pants leg Pants- Performed by helper: Thread/unthread right pants leg, Pull pants up/down, Fasten/unfasten pants Non-skid slipper socks- Performed by helper: Don/doff right sock, Don/doff left sock Assist for footwear: Dependant Assist for lower body dressing:  (Total A)  Function - Toileting Toileting activity did not occur: No continent bowel/bladder event Toileting steps completed by patient: Adjust clothing prior to toileting Toileting steps completed by helper: Adjust clothing prior to toileting, Performs perineal hygiene, Adjust clothing after toileting Assist level: Two helpers  Function - Air cabin crew transfer activity did not occur:  (not attempted) Toilet transfer assistive device: Elevated toilet seat/BSC over toilet (stedy) Assist level to toilet: Moderate assist (Pt 50 - 74%/lift or lower) Assist level from toilet: Moderate assist (Pt 50 - 74%/lift or lower)  Function - Chair/bed transfer Chair/bed transfer method: Squat pivot Chair/bed transfer assist level: Maximal assist (Pt 25 - 49%/lift and lower) Chair/bed transfer assistive device: Bedrails, Armrests Chair/bed transfer details: Verbal cues for precautions/safety, Verbal cues for safe use of DME/AE, Tactile cues for placement, Manual facilitation for placement, Manual facilitation for weight shifting  Function - Locomotion: Wheelchair Will patient use wheelchair at discharge?: Yes Type: Manual Max wheelchair distance: 150 Assist Level: Dependent (Pt equals 0%) Assist Level: Dependent (Pt equals 0%) Assist Level: Dependent (Pt equals 0%) Turns around,maneuvers to table,bed, and toilet,negotiates 3% grade,maneuvers on rugs and over doorsills: No Function - Locomotion: Ambulation Assistive device: Rail in hallway Max distance: 10 Assist level: 2 helpers (total for gait, +2 for w/c follow) Assist  level: 2 helpers Walk 50 feet with 2 turns activity did not occur: Safety/medical concerns Walk 150 feet activity did not occur: Safety/medical concerns Walk 10 feet on uneven surfaces activity did not occur: Safety/medical concerns  Function - Comprehension Comprehension: Auditory Comprehension assist level: Understands basic 50 - 74% of the time/ requires cueing 25 - 49% of the time  Function - Expression Expression: Verbal Expression assist level: Expresses basis less than 25% of the time/requires cueing >75% of the time.  Function - Social Interaction Social Interaction assist level: Interacts appropriately 50 - 74% of the time - May be physically or verbally inappropriate.  Function - Problem Solving Problem solving assist level: Solves basic 25 - 49% of the time - needs direction more than half the time to initiate, plan or complete simple activities  Function - Memory Memory assist level: Recognizes or recalls less than 25% of the time/requires cueing greater than 75% of the time Patient normally able to recall (first 3 days only): That he or she is in a hospital  Medical Problem List and Plan: 1.  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  DVT Prophylaxis/Anticoagulation: Pharmaceutical: Lovenox,    -Vascular study  with small right baker's cyst, otherwise unremarkable 3. Pain Management: Added Sportscreme for back pain.  4. Mood: LCSW to follow for evaluation and support  -paxil trial for depression.  5. Neuropsych: This patient is not capable of making decisions on her own behalf. 6. Skin/Wound Care: pressure relief measures. 7. Fluids/Electrolytes/Nutrition: Monitor I/O.  8. HTN: monitor BP bid--continue HCTZ.   Controlled 4/11,off hctz now on toprol xl 138m Vitals:   06/04/16 1700 06/05/16 0501  BP: (!) 160/81 135/78  Pulse: 69 80  Resp: 18 18  Temp: 98.4 F (36.9 C)  98 F (36.7 C)    9. Hypokalemia: resolved on  supplement.  K+ 4.2 on 4/16  11. NSTEMI: Will need follow up NUC study in the future.  12. T2DM with peripheral neuropathy: Monitor BS ac/hs. Hgb A1c- 10.3- .      -some elevation over last 36 hours  -increase amaryl to 329m monitor may be abel to resume metformin given normal creat CBG (last 3)   Recent Labs  06/03/16 1706 06/03/16 2055 06/04/16 0651  GLUCAP 94 220* 137*   13.  ARF- prerenal azotemia  cont .45 NS qhs, EF 45% no signs overload, weights stable around 63kg  -follow up labs normal, poor fluid intake cont IVF at noc but reduce to 5022mr  LOS (Days) 11 A FACE TO FACE EVALUATION WAS PERFORMED  KIRSTEINS,ANDREW E 06/05/2016, 7:34 AM

## 2016-06-05 NOTE — Progress Notes (Signed)
Physical Therapy Session Note  Patient Details  Name: Paige Stewart MRN: [id number] Date of Birth: 1950/04/07  Today's Date: 06/05/2016 PT Individual Time: 1006-1100 PT Individual Time Calculation (min): 54 min   Short Term Goals: Week 2:  PT Short Term Goal 1 (Week 2): Pt demonstrate static sitting balance with LUE support x3 minutes with supervision.  PT Short Term Goal 2 (Week 2): Pt will initiate stair training for NMR and strengthening PT Short Term Goal 3 (Week 2): Pt will propel w/c x50' with min assist   Skilled Therapeutic Interventions/Progress Updates:    no c/o pain.  Session focus on transfers, sitting balance, standing tolerance and midline orientation in standing, and w/c propulsion.    Pt transitions squat/pivot w/c>mat on R with max assist.  Dynamic sitting balance edge of mat with up to mod assist for pt to reach forward and across midline to R for cups and stack to her L.  Pt continues to have decreased righting reactions and delayed protective responses to the R.  Standing activity matching cards with max assist for midline orientation and multimodal cues for RLE quad activation, hip extension, and upright posture.  Pt able to complete standing activity for 2 trials, 9 cards each trial.  PT instructed pt in R<>L scooting focus on forward weight shift and lateral translation of hips with decreased shearing for carry over to transfers.  Pt transferred back to w/c towards L with mod assist.  W/C propulsion x60' with mod assist and max multimodal cues for use of L hemi technique.  Pt left upright in w/c with tray in place, call bell in reach and needs met.   Therapy Documentation Precautions:  Precautions Precautions: Fall Precaution Comments: dense R hemiparesis Restrictions Weight Bearing Restrictions: No   See Function Navigator for Current Functional Status.   Therapy/Group: Individual Therapy  Earnest Conroy Penven-Crew 06/05/2016, 12:15 PM

## 2016-06-05 NOTE — Progress Notes (Signed)
Physical Therapy Session Note  Patient Details Melany Wiesman Medlock MRN: [id number] Date of Birth: 1950/07/04  Today's Date: 06/05/2016 PT Individual Time: 1300-1330 PT Individual Time Calculation (min): 30 min   Skilled Therapeutic Interventions/Progress Updates:   Neuro re-ed for supine -> sit to come to EOB to finish lunch while addressing functional sitting balance. Pt required mod assist to come to EOB with cues for technique and facilitation for weightshifting for reciprocal scooting. Pt maintained sitting balance with supervision during lunch with cues for safe swallowing strategies and cues for attention to wipe mouth from food spillage on R side. Facilitated stand pivot transfer with max assist to w/c with cues for hand placement, technique, and weightshift. Pt with decreased awareness of positioning in w/c, requiring max assist for repositioning. Neuro re-ed on Kinetron from seated position in w/c for reciprocal movement pattern retraining (PT assisted with RLE) while RUE positioned in weightbearing position for feedback and cues for upright trunk.   Therapy Documentation Precautions:  Precautions Precautions: Fall Precaution Comments: dense R hemiparesis Restrictions Weight Bearing Restrictions: No   Pain:  Denies pain.   See Function Navigator for Current Functional Status.   Therapy/Group: Individual Therapy  Karolee Stamps Darrol Poke, PT, DPT  06/05/2016, 1:35 PM

## 2016-06-06 ENCOUNTER — Inpatient Hospital Stay (HOSPITAL_COMMUNITY): Payer: No Typology Code available for payment source | Admitting: Occupational Therapy

## 2016-06-06 ENCOUNTER — Inpatient Hospital Stay (HOSPITAL_COMMUNITY): Payer: No Typology Code available for payment source | Admitting: Speech Pathology

## 2016-06-06 ENCOUNTER — Inpatient Hospital Stay (HOSPITAL_COMMUNITY): Payer: No Typology Code available for payment source

## 2016-06-06 ENCOUNTER — Inpatient Hospital Stay (HOSPITAL_COMMUNITY): Payer: No Typology Code available for payment source | Admitting: Physical Therapy

## 2016-06-06 LAB — BASIC METABOLIC PANEL
Anion gap: 8 (ref 5–15)
BUN: 17 mg/dL (ref 6–20)
CHLORIDE: 104 mmol/L (ref 101–111)
CO2: 23 mmol/L (ref 22–32)
CREATININE: 0.73 mg/dL (ref 0.44–1.00)
Calcium: 9.3 mg/dL (ref 8.9–10.3)
GFR calc Af Amer: >60 mL/min (ref >60)
GFR calc non Af Amer: >60 mL/min (ref >60)
GLUCOSE: 179 mg/dL — AB (ref 65–99)
POTASSIUM: 4.7 mmol/L (ref 3.5–5.1)
Sodium: 135 mmol/L (ref 135–145)

## 2016-06-06 LAB — GLUCOSE, CAPILLARY
GLUCOSE-CAPILLARY: 144 mg/dL — AB (ref 65–99)
GLUCOSE-CAPILLARY: 179 mg/dL — AB (ref 65–99)
GLUCOSE-CAPILLARY: 114 mg/dL — AB (ref 65–99)
Glucose-Capillary: 114 mg/dL — ABNORMAL HIGH (ref 65–99)
Glucose-Capillary: 221 mg/dL — ABNORMAL HIGH (ref 65–99)
Glucose-Capillary: 130 mg/dL — ABNORMAL HIGH (ref 65–99)

## 2016-06-06 MED ORDER — SENNOSIDES-DOCUSATE SODIUM 8.6-50 MG PO TABS
2.0000 | ORAL_TABLET | Freq: Every day | ORAL | Status: DC
  Administered 2016-06-06 – 2016-06-07 (×3): 2 via ORAL
  Filled 2016-06-06 (×2): qty 2

## 2016-06-06 NOTE — Progress Notes (Signed)
Subjective/Complaints: Slept ok, some RIght wrist pain ROS: Limited due cognitive/linguistic   Objective: Vital Signs: Blood pressure (!) 154/76, pulse 68, temperature 98.7 F (37.1 C), temperature source Oral, resp. rate 18, height 4' 11"  (1.499 m), weight 63.1 kg (139 lb 1.8 oz), SpO2 100 %. No results found. Results for orders placed or performed during the hospital encounter of 05/25/16 (from the past 72 hour(s))  Glucose, capillary     Status: Abnormal   Collection Time: 06/03/16 11:57 AM  Result Value Ref Range   Glucose-Capillary 180 (H) 65 - 99 mg/dL  Glucose, capillary     Status: None   Collection Time: 06/03/16  5:06 PM  Result Value Ref Range   Glucose-Capillary 94 65 - 99 mg/dL  Glucose, capillary     Status: Abnormal   Collection Time: 06/03/16  8:55 PM  Result Value Ref Range   Glucose-Capillary 220 (H) 65 - 99 mg/dL  Glucose, capillary     Status: Abnormal   Collection Time: 06/04/16  6:51 AM  Result Value Ref Range   Glucose-Capillary 137 (H) 65 - 99 mg/dL  Glucose, capillary     Status: Abnormal   Collection Time: 06/04/16 11:46 AM  Result Value Ref Range   Glucose-Capillary 158 (H) 65 - 99 mg/dL  Glucose, capillary     Status: Abnormal   Collection Time: 06/04/16  4:49 PM  Result Value Ref Range   Glucose-Capillary 101 (H) 65 - 99 mg/dL  Glucose, capillary     Status: Abnormal   Collection Time: 06/04/16  9:24 PM  Result Value Ref Range   Glucose-Capillary 230 (H) 65 - 99 mg/dL  Basic metabolic panel     Status: Abnormal   Collection Time: 06/05/16  5:56 AM  Result Value Ref Range   Sodium 139 135 - 145 mmol/L   Potassium 4.2 3.5 - 5.1 mmol/L   Chloride 105 101 - 111 mmol/L   CO2 24 22 - 32 mmol/L   Glucose, Bld 136 (H) 65 - 99 mg/dL   BUN 15 6 - 20 mg/dL   Creatinine, Ser 0.74 0.44 - 1.00 mg/dL   Calcium 9.2 8.9 - 10.3 mg/dL   GFR calc non Af Amer >60 >60 mL/min   GFR calc Af Amer >60 >60 mL/min    Comment: (NOTE) The eGFR has been  calculated using the CKD EPI equation. This calculation has not been validated in all clinical situations. eGFR's persistently <60 mL/min signify possible Chronic Kidney Disease.    Anion gap 10 5 - 15  Glucose, capillary     Status: Abnormal   Collection Time: 06/05/16  6:36 AM  Result Value Ref Range   Glucose-Capillary 128 (H) 65 - 99 mg/dL  Glucose, capillary     Status: Abnormal   Collection Time: 06/05/16 11:51 AM  Result Value Ref Range   Glucose-Capillary 241 (H) 65 - 99 mg/dL     HEENT: Normocephalic, atraumatic Cardio:RRR. Resp: CTA bilaterally. Normal effort GI: BS positive and ND Skin:   Intact. Warm and dry. Neuro: Flat, word finding deficits.  Motor 0 /5 in RUE and 2- hip/knee ext synergy RLE Musc/Skel:  No edema. No tenderness.no pain with ROM RIght wrist , no effusion or erythema Gen NAD. Vital signs reviewed.   Assessment/Plan: 1. Functional deficits secondary to Basal ganglia infarct which require 3+ hours per day of interdisciplinary therapy in a comprehensive inpatient rehab setting. Physiatrist is providing close team supervision and 24 hour management of active medical problems  listed below. Physiatrist and rehab team continue to assess barriers to discharge/monitor patient progress toward functional and medical goals. FIM: Function - Bathing Position: Shower Body parts bathed by patient: Right arm, Chest, Abdomen, Front perineal area, Right upper leg, Left upper leg Body parts bathed by helper: Left arm, Buttocks, Right lower leg, Back Assist Level: Touching or steadying assistance(Pt > 75%)  Function- Upper Body Dressing/Undressing What is the patient wearing?: Pull over shirt/dress Pull over shirt/dress - Perfomed by patient: Put head through opening, Pull shirt over trunk, Thread/unthread left sleeve Pull over shirt/dress - Perfomed by helper: Thread/unthread right sleeve Button up shirt - Perfomed by patient: Thread/unthread right sleeve Button up  shirt - Perfomed by helper: Thread/unthread left sleeve, Button/unbutton shirt, Pull shirt around back Assist Level:  (Min-Mod A) Function - Lower Body Dressing/Undressing What is the patient wearing?: Pants, Socks Position: Wheelchair/chair at sink Pants- Performed by patient: Thread/unthread left pants leg Pants- Performed by helper: Thread/unthread right pants leg, Pull pants up/down, Fasten/unfasten pants Non-skid slipper socks- Performed by helper: Don/doff right sock, Don/doff left sock Assist for footwear: Dependant Assist for lower body dressing:  (Total A)  Function - Toileting Toileting activity did not occur: No continent bowel/bladder event Toileting steps completed by patient: Adjust clothing prior to toileting Toileting steps completed by helper: Adjust clothing prior to toileting, Performs perineal hygiene, Adjust clothing after toileting Toileting Assistive Devices: Grab bar or rail Assist level: Two helpers  Function - Air cabin crew transfer activity did not occur:  (not attempted) Toilet transfer assistive device: Elevated toilet seat/BSC over toilet, Mechanical lift Mechanical lift: Stedy Assist level to toilet: Touching or steadying assistance (Pt > 75%) Assist level from toilet: Touching or steadying assistance (Pt > 75%)  Function - Chair/bed transfer Chair/bed transfer method: Stand pivot Chair/bed transfer assist level: Maximal assist (Pt 25 - 49%/lift and lower) Chair/bed transfer assistive device: Armrests Chair/bed transfer details: Verbal cues for precautions/safety, Verbal cues for safe use of DME/AE, Tactile cues for placement, Manual facilitation for placement, Manual facilitation for weight shifting  Function - Locomotion: Wheelchair Will patient use wheelchair at discharge?: Yes Type: Manual Max wheelchair distance: 150 Assist Level: Dependent (Pt equals 0%) Assist Level: Dependent (Pt equals 0%) Assist Level: Dependent (Pt equals  0%) Turns around,maneuvers to table,bed, and toilet,negotiates 3% grade,maneuvers on rugs and over doorsills: No Function - Locomotion: Ambulation Assistive device: Rail in hallway Max distance: 10 Assist level: 2 helpers (total for gait, +2 for w/c follow) Assist level: 2 helpers Walk 50 feet with 2 turns activity did not occur: Safety/medical concerns Walk 150 feet activity did not occur: Safety/medical concerns Walk 10 feet on uneven surfaces activity did not occur: Safety/medical concerns  Function - Comprehension Comprehension: Auditory Comprehension assist level: Understands basic 50 - 74% of the time/ requires cueing 25 - 49% of the time  Function - Expression Expression: Verbal Expression assist level: Expresses basis less than 25% of the time/requires cueing >75% of the time.  Function - Social Interaction Social Interaction assist level: Interacts appropriately 50 - 74% of the time - May be physically or verbally inappropriate.  Function - Problem Solving Problem solving assist level: Solves basic 25 - 49% of the time - needs direction more than half the time to initiate, plan or complete simple activities  Function - Memory Memory assist level: Recognizes or recalls less than 25% of the time/requires cueing greater than 75% of the time Patient normally able to recall (first 3 days only): That he  or she is in a hospital  Medical Problem List and Plan: 1.  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  DVT Prophylaxis/Anticoagulation: Pharmaceutical: Lovenox,    -Vascular study with small right baker's cyst, otherwise unremarkable 3. Pain Management: Added Sportscreme for back pain.  4. Mood: LCSW to follow for evaluation and support  -paxil trial for depression.  5. Neuropsych: This patient is not capable of making decisions on her own behalf. 6. Skin/Wound Care: pressure relief measures. 7.  Fluids/Electrolytes/Nutrition: Monitor I/O.  8. HTN: monitor BP bid--continue HCTZ.   Variable systolic or diastolic HTN ,off hctz now on toprol xl 112m, may need to add amlodipine, cont to monitor for now Vitals:   06/05/16 1651 06/06/16 0521  BP: (!) 147/92 (!) 154/76  Pulse: 70 68  Resp: 18 18  Temp: 98.8 F (37.1 C) 98.7 F (37.1 C)    9. Hypokalemia: resolved on  supplement.  K+ 4.2 on 4/16  11. NSTEMI: Will need follow up NUC study in the future.  12. T2DM with peripheral neuropathy: Monitor BS ac/hs. Hgb A1c- 10.3- .      -some elevation over last 36 hours  -increase amaryl to 316m resume metformin 50014mam CBG (last 3)   Recent Labs  06/04/16 2124 06/05/16 0636 06/05/16 1151  GLUCAP 230* 128* 241*   13.  ARF- prerenal azotemia  cont .45 NS qhs, EF 45% no signs overload, weights stable around 63kg  -follow up labs normal, poor fluid intake cont IVF at noc but reduce to 48m83m, BMET im am  LOS (Days) 12 A FACE TO FACE EVALUATION WAS PERFORMED  Kelcey Wickstrom E 06/06/2016, 7:42 AM

## 2016-06-06 NOTE — Progress Notes (Deleted)
Speech Language Pathology Daily Session Note  Patient Details  Name: Paige Stewart MRN: [id number] Date of Birth: December 05, 1950  Today's Date: 06/06/2016 SLP Individual Time: 1000-1030 SLP Individual Time Calculation (min): 30 min  Short Term Goals: Week 1: SLP Short Term Goal 1 (Week 1): Patient will consume current diet with minimal overt s/s of aspiration with Mod A verbal cues needed for use of swallowing compensatory strategies.  SLP Short Term Goal 1 - Progress (Week 1): Not met SLP Short Term Goal 2 (Week 1): Patient will utilize chin tuck with trials of thin liquids without overt s/s of aspiration in 75% of trials with Mod A verbal cues over 3 sessions prior to upgrade.  SLP Short Term Goal 2 - Progress (Week 1): Not met SLP Short Term Goal 3 (Week 1): Patient will utilize an increased vocal intensity at the word and phrase level to achieve ~75% intelligibility with Mod A verbal cues.  SLP Short Term Goal 3 - Progress (Week 1): Not met SLP Short Term Goal 4 (Week 1): Patient will name functional items with 75% accuracy and Min A verbal cues.  SLP Short Term Goal 4 - Progress (Week 1): Met SLP Short Term Goal 5 (Week 1): Patient will attend to right field of enviornment during functional tasks with Max A multimodal cues. SLP Short Term Goal 5 - Progress (Week 1): Met SLP Short Term Goal 6 (Week 1): Patient will initiate functional tasks with Mod A verbal and visual cues.  SLP Short Term Goal 6 - Progress (Week 1): Met  Skilled Therapeutic Interventions: Skilled treatment session focused on dysphagia goals. SLP facilitated session by providing Max A multimodal cues for chin tuck with trials of ice chips. Pt with difficulty implementing and throat clears with each ice chip given. Pt was left upright in recliner and all needs within reach. Continue per current plan of care.      Function:  Eating Eating   Modified Consistency Diet: No (Trials of ice chips with SLP only) Eating Assist  Level: Supervision or verbal cues   Eating Set Up Assist For: Opening containers       Cognition Comprehension Comprehension assist level: Understands basic 50 - 74% of the time/ requires cueing 25 - 49% of the time  Expression   Expression assist level: Expresses basis less than 25% of the time/requires cueing >75% of the time.  Social Interaction Social Interaction assist level: Interacts appropriately 50 - 74% of the time - May be physically or verbally inappropriate.  Problem Solving Problem solving assist level: Solves basic 25 - 49% of the time - needs direction more than half the time to initiate, plan or complete simple activities  Memory Memory assist level: Recognizes or recalls less than 25% of the time/requires cueing greater than 75% of the time    Pain    Therapy/Group: Individual Therapy   Deem Marmol B. Rutherford Nail, [name], [name]   Burdett Pinzon 06/06/2016, 2:50 PM

## 2016-06-06 NOTE — Progress Notes (Signed)
Bladder scanned patient this morning for 438 cc. Sat on the bedpan and she was unable to void. Rn performed in and out cath for 435 cc.

## 2016-06-06 NOTE — Progress Notes (Signed)
Speech Language Pathology Daily Session Note  Patient Details  Name: Paige Stewart MRN: [id number] Date of Birth: 1950/09/30  Today's Date: 06/06/2016 SLP Individual Time: 4098-1191 SLP Individual Time Calculation (min): 30 min  Short Term Goals: Week 2: SLP Short Term Goal 1 (Week 2): Patient will consume current diet with minimal overt s/s of aspiration with Mod A verbal cues needed for use of swallowing compensatory strategies.  SLP Short Term Goal 2 (Week 2): Patient will utilize chin tuck with trials of thin liquids without overt s/s of aspiration in 75% of trials with Mod A verbal cues over 3 sessions prior to upgrade.  SLP Short Term Goal 3 (Week 2): Patient will utilize an increased vocal intensity at the word and phrase level to achieve ~75% intelligibility with Mod A verbal cues.  SLP Short Term Goal 4 (Week 2): Patient will self-monitor and correct verbal errors at the phrase level with Max A multimodal cues.  SLP Short Term Goal 5 (Week 2): Patient will attend to right field of enviornment during functional tasks with Mod A multimodal cues. SLP Short Term Goal 6 (Week 2): Patient will demonstrate functional problem solving for basic and familiar tasks with Mod A verbal cues.   Skilled Therapeutic Interventions: Skilled treatment session focused on addressing dysphagia goals. SLP facilitated session by providing set-up of Dys.1 textures and thin liquids via cup and straw with no overt s/s of aspiration.  Textures were then advanced to Dys.2 and regular with regular textures resulting in prolonged mastication and cues to monitor and clear buccal residue.  Patient also benefited from Min verbal and visual cues to monitor and manage anterior loss of thin liquid cup sips.  Recomment advancement to Dys.2 textures with thin liquids and continued full supervision to ensure carryover of strategies during meals.     Function:  Eating Eating   Modified Consistency Diet: Yes Eating Assist  Level: Supervision or verbal cues;Helper checks for pocketed food;Set up assist for   Eating Set Up Assist For: Opening containers       Cognition Comprehension Comprehension assist level: Understands basic 50 - 74% of the time/ requires cueing 25 - 49% of the time  Expression   Expression assist level: Expresses basic 25 - 49% of the time/requires cueing 50 - 75% of the time. Uses single words/gestures.  Social Interaction Social Interaction assist level: Interacts appropriately 75 - 89% of the time - Needs redirection for appropriate language or to initiate interaction.  Problem Solving Problem solving assist level: Solves basic 50 - 74% of the time/requires cueing 25 - 49% of the time  Memory Memory assist level: Recognizes or recalls 25 - 49% of the time/requires cueing 50 - 75% of the time    Pain Pain Assessment Pain Assessment: No/denies pain  Therapy/Group: Individual Therapy  Charlane Ferretti., CCC-SLP [phone]  Paige Stewart 06/06/2016, 3:30 PM

## 2016-06-06 NOTE — Progress Notes (Signed)
Occupational Therapy Session Note  Patient Details  Name: Paige Stewart MRN: [id number] Date of Birth: [birth year]  Today's Date: 06/06/2016 OT Individual Time: 1300-1400 OT Individual Time Calculation (min): 60 min   Short Term Goals: Week 2:  OT Short Term Goal 1 (Week 2): Pt will dress UB with mod A in order to reduce caregiver burden OT Short Term Goal 2 (Week 2): Pt will recall hemi-dressing techniques 2 consecutive days with min questioning cues OT Short Term Goal 3 (Week 2): Pt will complete sit<>stand at the sink in preparation for ADL tasks with Mod A to decrease caregiver burger  Skilled Therapeutic Interventions/Progress Updates:    OT treatment session focused on self-feeding, improved sit<>stand, R NMR. Pt greeted seated in recliner as RN brought in lunch tray. Set-up A to open containers, then able to manage utensil to scoop food. Min VC for appropriate sized bites and R anterior spillage. OT asked pt if she needed to use the bathroom and pt reported yes. Stedy used sit<>stand Mod A to transfer onto toilet. Pt voided bladder successfully, pt seems to have started menstrual cycle requiring  Max  A to wash peri-care and assistance to manage clothing before and after toileting using Stedy to stand. Squat-pivot from toilet> w/c to unaffected L side with Max A. Hand-washing seated in wc at the sink with Min A and VC to place affected R UE onto sink and set-up for soap, water adjuctment, and paper towels. R NMR using joint input to bring pt through full ROM of scapula/shoulder/elbow/wrist/hand- no muscle activation noted. Squat-pivot to L from w/c>recliner w/ Max A. Attempted to place safety belt on pt and pt became agitated and refused safety belt. OT placed tray over top of pt through recliner legs as extra safety measure. Pt left with call bell and needs met.   Therapy Documentation Precautions:  Precautions Precautions: Fall Precaution Comments: dense R  hemiparesis Restrictions Weight Bearing Restrictions: No Pain: Pain Assessment Pain Assessment: 0-10 Pain Score: 0-No pain ADL: ADL ADL Comments: Please see functional navigator  See Function Navigator for Current Functional Status.   Therapy/Group: Individual Therapy  Valma Cava 06/06/2016, 1:16 PM

## 2016-06-06 NOTE — Progress Notes (Signed)
Speech Language Pathology Daily Session Note  Patient Details  Name: Paige Stewart MRN: [id number] Date of Birth: [birth year]  Today's Date: 06/06/2016 SLP Individual Time: 1000-1030 SLP Individual Time Calculation (min): 30 min  Short Term Goals: Week 2: SLP Short Term Goal 1 (Week 2): Patient will consume current diet with minimal overt s/s of aspiration with Mod A verbal cues needed for use of swallowing compensatory strategies.  SLP Short Term Goal 2 (Week 2): Patient will utilize chin tuck with trials of thin liquids without overt s/s of aspiration in 75% of trials with Mod A verbal cues over 3 sessions prior to upgrade.  SLP Short Term Goal 3 (Week 2): Patient will utilize an increased vocal intensity at the word and phrase level to achieve ~75% intelligibility with Mod A verbal cues.  SLP Short Term Goal 4 (Week 2): Patient will self-monitor and correct verbal errors at the phrase level with Max A multimodal cues.  SLP Short Term Goal 5 (Week 2): Patient will attend to right field of enviornment during functional tasks with Mod A multimodal cues. SLP Short Term Goal 6 (Week 2): Patient will demonstrate functional problem solving for basic and familiar tasks with Mod A verbal cues.   Skilled Therapeutic Interventions: Skilled treatment session focused on dysphagia goals. SLP facilitated session by providing Max A multimodal cues for chin tuck with trials of ice chips. Pt with difficulty implementing and throat clears with each ice chip given. Pt was left upright in recliner and all needs within reach. Continue per current plan of care.     Function:  Eating Eating   Modified Consistency Diet: No (Trials of ice chips with SLP only) Eating Assist Level: Supervision or verbal cues   Eating Set Up Assist For: Opening containers       Cognition Comprehension Comprehension assist level: Understands basic 50 - 74% of the time/ requires cueing 25 - 49% of the time  Expression    Expression assist level: Expresses basis less than 25% of the time/requires cueing >75% of the time.  Social Interaction Social Interaction assist level: Interacts appropriately 50 - 74% of the time - May be physically or verbally inappropriate.  Problem Solving Problem solving assist level: Solves basic 25 - 49% of the time - needs direction more than half the time to initiate, plan or complete simple activities  Memory Memory assist level: Recognizes or recalls less than 25% of the time/requires cueing greater than 75% of the time    Pain    Therapy/Group: Individual Therapy   [name], [name], [name]   [name] 06/06/2016, 2:53 PM

## 2016-06-06 NOTE — Progress Notes (Signed)
Physical Therapy Session Note  Patient Details  Name: Paige Stewart MRN: [id number] Date of Birth: 01-Dec-1950  Today's Date: 06/06/2016 PT Individual Time: 0900-1000 PT Individual Time Calculation (min): 60 min   Short Term Goals: Week 2:  PT Short Term Goal 1 (Week 2): Pt demonstrate static sitting balance with LUE support x3 minutes with supervision.  PT Short Term Goal 2 (Week 2): Pt will initiate stair training for NMR and strengthening PT Short Term Goal 3 (Week 2): Pt will propel w/c x50' with min assist   Skilled Therapeutic Interventions/Progress Updates:    no c/o pain.  Session focus on forced use of RUE/RLE, sitting balance, and transfers.    Pt transitions supine>sit towards L side with min assist, HOB elevated, and bed rails.  Sitting balance EOB focus on posture during self feeding task, min cues to monitor spillage from R mouth and for small bites, no overt S/S aspiration.  Stand/pivot to w/c with mod assist to rise, but max to initiate/control pivot.  Pt engaged in washing face/hands at sink, and dressing from w/c at sink with over mod assist for UB and LB dressing with sit<>stand to pull pants over hips.  In therapy gym, pt engaged in blocked practice for stand/squat pivot from w/c<>therapy mat x5 trials each direction with max assist.  Despite max multimodal cues, pt continues to slide hips forward without initiating pivot or maintaining head/hips relationship.  Pt returned to room at end of session and postiioned in recliner with max assist to transfer, call bell in reach, and needs met.   Therapy Documentation Precautions:  Precautions Precautions: Fall Precaution Comments: dense R hemiparesis Restrictions Weight Bearing Restrictions: No  See Function Navigator for Current Functional Status.   Therapy/Group: Individual Therapy  Danica Camarena E Penven-Crew 06/06/2016, 10:04 AM

## 2016-06-06 NOTE — Progress Notes (Signed)
  When patient's brief was changed, what appeared to be bloody urine was noted. On exam, no skin tears or other sources of bleeding were discovered. Will continue to monitor.

## 2016-06-07 ENCOUNTER — Inpatient Hospital Stay (HOSPITAL_COMMUNITY): Payer: No Typology Code available for payment source | Admitting: Physical Therapy

## 2016-06-07 ENCOUNTER — Inpatient Hospital Stay (HOSPITAL_COMMUNITY): Payer: No Typology Code available for payment source | Admitting: Occupational Therapy

## 2016-06-07 ENCOUNTER — Inpatient Hospital Stay (HOSPITAL_COMMUNITY): Payer: No Typology Code available for payment source | Admitting: Speech Pathology

## 2016-06-07 LAB — GLUCOSE, CAPILLARY
GLUCOSE-CAPILLARY: 153 mg/dL — AB (ref 65–99)
Glucose-Capillary: 244 mg/dL — ABNORMAL HIGH (ref 65–99)
Glucose-Capillary: 155 mg/dL — ABNORMAL HIGH (ref 65–99)

## 2016-06-07 MED ORDER — ACETAMINOPHEN 325 MG PO TABS
650.0000 mg | ORAL_TABLET | Freq: Every day | ORAL | Status: DC
  Administered 2016-06-07 – 2016-06-15 (×9): 650 mg via ORAL
  Filled 2016-06-07 (×9): qty 2

## 2016-06-07 MED ORDER — NON FORMULARY: 3.0000 mg | Freq: Every day | Status: DC

## 2016-06-07 MED ORDER — METFORMIN HCL 500 MG PO TABS
500.0000 mg | ORAL_TABLET | Freq: Every day | ORAL | Status: DC
  Administered 2016-06-07: 500 mg via ORAL
  Filled 2016-06-07: qty 1

## 2016-06-07 MED ORDER — METOPROLOL SUCCINATE ER 50 MG PO TB24
75.0000 mg | ORAL_TABLET | Freq: Every day | ORAL | Status: DC
  Administered 2016-06-08 – 2016-06-16 (×9): 75 mg via ORAL
  Filled 2016-06-07 (×10): qty 1

## 2016-06-07 MED ORDER — MELATONIN 3 MG PO TABS
3.0000 mg | ORAL_TABLET | Freq: Every day | ORAL | Status: DC
  Administered 2016-06-07 – 2016-06-15 (×9): 3 mg via ORAL
  Filled 2016-06-07 (×11): qty 1

## 2016-06-07 MED ORDER — SODIUM CHLORIDE 0.45 % IV SOLN
INTRAVENOUS | Status: DC
  Administered 2016-06-07: 12:00:00 via INTRAVENOUS

## 2016-06-07 MED ORDER — SODIUM CHLORIDE 0.45 % IV SOLN
INTRAVENOUS | Status: DC
  Administered 2016-06-07 – 2016-06-11 (×4): via INTRAVENOUS

## 2016-06-07 NOTE — Progress Notes (Signed)
Occupational Therapy Session Note  Patient Details  Name: Paige Stewart MRN: [id number] Date of Birth: 1950/12/19  Today's Date: 06/07/2016 OT Individual Time: 1110-1210 OT Individual Time Calculation (min): 60 min    Short Term Goals: Week 1:  OT Short Term Goal 1 (Week 1): Pt will complete toilet transfer with Max A +1 OT Short Term Goal 1 - Progress (Week 1): Met OT Short Term Goal 2 (Week 1): Pt will complete UB dressing using hemi techniques with Mod A OT Short Term Goal 2 - Progress (Week 1): Progressing toward goal OT Short Term Goal 3 (Week 1): Pt will maintain sitting balance during functional task seated EOB with min A OT Short Term Goal 3 - Progress (Week 1): Met Week 2:  OT Short Term Goal 1 (Week 2): Pt will dress UB with mod A in order to reduce caregiver burden OT Short Term Goal 2 (Week 2): Pt will recall hemi-dressing techniques 2 consecutive days with min questioning cues OT Short Term Goal 3 (Week 2): Pt will complete sit<>stand at the sink in preparation for ADL tasks with Mod A to decrease caregiver burger     Skilled Therapeutic Interventions/Progress Updates:    Pt seen this session for NMR to focus on transfers, postural control, RUE NMR.  Pt received in recliner. Max A squat pivot to L to w/c, and then to L to mat.  Pt sat at edge of mat with close S for static sit. Daughter arrived to participate in therapy. Worked on RUE wt bearing with slight lean to R to facilitate protective responses to prevent overfalling. Pt needed multiple repetitions until she was able to actively use trunk to pull back into midline. RUE on bedside table for tapping and PROM with no muscular response. Reviewed with daughter how she can work on [place] in the room with her mom and how to have her mom work on self ROM. Pt currently needs constant cues to attend to R side. Pt was very distractible watching other patients.  Worked on dynamic reaching to the L using LUE for trunk elongation and  sit to stand from mat. Pt stood for 1-2 minutes with LUE support. Once sitting pt stated she felt dizzy. Transferred pt back to w/c with bobath method to her R with total A. Returned to room to check Blood pressure, which was 71/54 and came level when checked a minute later.  Daughter assisted with total A transfer into bed, pt moved into [place], RN/PA notified.  After a few minutes it resumed to 90/50.  Nursing staff with patient at end of session.    Therapy Documentation Precautions:  Precautions Precautions: Fall Precaution Comments: dense R hemiparesis Restrictions Weight Bearing Restrictions: No    Pain: Pain Assessment Pain Score: 0-No pain ADL: ADL ADL Comments: Please see functional navigator  See Function Navigator for Current Functional Status.   Therapy/Group: Individual Therapy  Paige Stewart 06/07/2016, 10:14 AM

## 2016-06-07 NOTE — Progress Notes (Signed)
Subjective/Complaints: Was upgraded to thin liq Right ankle pain, no known trauma ROS: Limited due cognitive/linguistic   Objective: Vital Signs: Blood pressure 135/61, pulse 75, temperature 98.5 F (36.9 C), temperature source Oral, resp. rate 18, height _0  (1.499 m), weight 65.6 kg (144 lb 11.2 oz), SpO2 100 %. No results found. Results for orders placed or performed during the hospital encounter of 05/25/16 (from the past 72 hour(s))  Glucose, capillary     Status: Abnormal   Collection Time: 06/04/16 11:46 AM  Result Value Ref Range   Glucose-Capillary 158 (H) 65 - 99 mg/dL  Glucose, capillary     Status: Abnormal   Collection Time: 06/04/16  4:49 PM  Result Value Ref Range   Glucose-Capillary 101 (H) 65 - 99 mg/dL  Glucose, capillary     Status: Abnormal   Collection Time: 06/04/16  9:24 PM  Result Value Ref Range   Glucose-Capillary 230 (H) 65 - 99 mg/dL  Basic metabolic panel     Status: Abnormal   Collection Time: 06/05/16  5:56 AM  Result Value Ref Range   Sodium 139 135 - 145 mmol/L   Potassium 4.2 3.5 - 5.1 mmol/L   Chloride 105 101 - 111 mmol/L   CO2 24 22 - 32 mmol/L   Glucose, Bld 136 (H) 65 - 99 mg/dL   BUN 15 6 - 20 mg/dL   Creatinine, Ser 0.74 0.44 - 1.00 mg/dL   Calcium 9.2 8.9 - 10.3 mg/dL   GFR calc non Af Amer >60 >60 mL/min   GFR calc Af Amer >60 >60 mL/min    Comment: (NOTE) The eGFR has been calculated using the CKD EPI equation. This calculation has not been validated in all clinical situations. eGFR's persistently <60 mL/min signify possible Chronic Kidney Disease.    Anion gap 10 5 - 15  Glucose, capillary     Status: Abnormal   Collection Time: 06/05/16  6:36 AM  Result Value Ref Range   Glucose-Capillary 128 (H) 65 - 99 mg/dL  Glucose, capillary     Status: Abnormal   Collection Time: 06/05/16 11:51 AM  Result Value Ref Range   Glucose-Capillary 241 (H) 65 - 99 mg/dL  Glucose, capillary     Status: Abnormal   Collection Time:  06/05/16  4:49 PM  Result Value Ref Range   Glucose-Capillary 114 (H) 65 - 99 mg/dL  Glucose, capillary     Status: Abnormal   Collection Time: 06/05/16 10:06 PM  Result Value Ref Range   Glucose-Capillary 221 (H) 65 - 99 mg/dL  Glucose, capillary     Status: Abnormal   Collection Time: 06/06/16  6:54 AM  Result Value Ref Range   Glucose-Capillary 144 (H) 65 - 99 mg/dL  Glucose, capillary     Status: Abnormal   Collection Time: 06/06/16  8:38 AM  Result Value Ref Range   Glucose-Capillary 130 (H) 65 - 99 mg/dL  Basic metabolic panel     Status: Abnormal   Collection Time: 06/06/16 12:01 PM  Result Value Ref Range   Sodium 135 135 - 145 mmol/L   Potassium 4.7 3.5 - 5.1 mmol/L   Chloride 104 101 - 111 mmol/L   CO2 23 22 - 32 mmol/L   Glucose, Bld 179 (H) 65 - 99 mg/dL   BUN 17 6 - 20 mg/dL   Creatinine, Ser 0.73 0.44 - 1.00 mg/dL   Calcium 9.3 8.9 - 10.3 mg/dL   GFR calc non Af Amer >60 >60  mL/min   GFR calc Af Amer >60 >60 mL/min    Comment: (NOTE) The eGFR has been calculated using the CKD EPI equation. This calculation has not been validated in all clinical situations. eGFR's persistently <60 mL/min signify possible Chronic Kidney Disease.    Anion gap 8 5 - 15  Glucose, capillary     Status: Abnormal   Collection Time: 06/06/16 12:18 PM  Result Value Ref Range   Glucose-Capillary 179 (H) 65 - 99 mg/dL  Glucose, capillary     Status: Abnormal   Collection Time: 06/06/16  5:15 PM  Result Value Ref Range   Glucose-Capillary 114 (H) 65 - 99 mg/dL  Glucose, capillary     Status: Abnormal   Collection Time: 06/06/16 10:19 PM  Result Value Ref Range   Glucose-Capillary 244 (H) 65 - 99 mg/dL  Glucose, capillary     Status: Abnormal   Collection Time: 06/07/16  6:28 AM  Result Value Ref Range   Glucose-Capillary 153 (H) 65 - 99 mg/dL     HEENT: Normocephalic, atraumatic Cardio:RRR. Resp: CTA bilaterally. Normal effort GI: BS positive and ND Skin:   Intact. Warm and  dry. Neuro: Flat, word finding deficits.  Motor 0 /5 in RUE and 2- hip/knee ext synergy RLE Musc/Skel:  No edema. No tenderness.no pain with ROM RIght wrist , no effusion or erythema, mild medial R ankle pain with palp no pain with ROM, no effusion  Gen NAD. Vital signs reviewed.   Assessment/Plan: 1. Functional deficits secondary to Basal ganglia infarct which require 3+ hours per day of interdisciplinary therapy in a comprehensive inpatient rehab setting. Physiatrist is providing close team supervision and 24 hour management of active medical problems listed below. Physiatrist and rehab team continue to assess barriers to discharge/monitor patient progress toward functional and medical goals. FIM: Function - Bathing Position: Shower Body parts bathed by patient: Right arm, Chest, Abdomen, Front perineal area, Right upper leg, Left upper leg Body parts bathed by helper: Left arm, Buttocks, Right lower leg, Back Assist Level: Touching or steadying assistance(Pt > 75%)  Function- Upper Body Dressing/Undressing What is the patient wearing?: Pull over shirt/dress Pull over shirt/dress - Perfomed by patient: Put head through opening, Pull shirt over trunk, Thread/unthread left sleeve Pull over shirt/dress - Perfomed by helper: Thread/unthread right sleeve Button up shirt - Perfomed by patient: Thread/unthread right sleeve Button up shirt - Perfomed by helper: Thread/unthread left sleeve, Button/unbutton shirt, Pull shirt around back Assist Level:  (Min-Mod A) Function - Lower Body Dressing/Undressing What is the patient wearing?: Pants, Socks Position: Wheelchair/chair at sink Pants- Performed by patient: Thread/unthread left pants leg Pants- Performed by helper: Thread/unthread right pants leg, Pull pants up/down, Fasten/unfasten pants Non-skid slipper socks- Performed by helper: Don/doff right sock, Don/doff left sock Assist for footwear: Dependant Assist for lower body dressing:  (Total  A)  Function - Toileting Toileting activity did not occur: No continent bowel/bladder event Toileting steps completed by patient: Adjust clothing prior to toileting, Performs perineal hygiene Toileting steps completed by helper: Adjust clothing after toileting Toileting Assistive Devices: Grab bar or rail Assist level: Two helpers  Function - Archivist transfer activity did not occur:  (not attempted) Toilet transfer assistive device: Elevated toilet seat/BSC over toilet, Mechanical lift Mechanical lift: Stedy Assist level to toilet: Touching or steadying assistance (Pt > 75%) Assist level from toilet: Touching or steadying assistance (Pt > 75%)  Function - Chair/bed transfer Chair/bed transfer method: Stand pivot Chair/bed transfer assist level:  Maximal assist (Pt 25 - 49%/lift and lower) Chair/bed transfer assistive device: Armrests Chair/bed transfer details: Verbal cues for precautions/safety, Verbal cues for safe use of DME/AE, Tactile cues for placement, Manual facilitation for placement, Manual facilitation for weight shifting  Function - Locomotion: Wheelchair Will patient use wheelchair at discharge?: Yes Type: Manual Max wheelchair distance: 150 Assist Level: Dependent (Pt equals 0%) Assist Level: Dependent (Pt equals 0%) Assist Level: Dependent (Pt equals 0%) Turns around,maneuvers to table,bed, and toilet,negotiates 3% grade,maneuvers on rugs and over doorsills: No Function - Locomotion: Ambulation Assistive device: Rail in hallway Max distance: 10 Assist level: 2 helpers (total for gait, +2 for w/c follow) Assist level: 2 helpers Walk 50 feet with 2 turns activity did not occur: Safety/medical concerns Walk 150 feet activity did not occur: Safety/medical concerns Walk 10 feet on uneven surfaces activity did not occur: Safety/medical concerns  Function - Comprehension Comprehension: Auditory Comprehension assist level: Understands basic 50 - 74% of  the time/ requires cueing 25 - 49% of the time  Function - Expression Expression: Verbal Expression assist level: Expresses basic 25 - 49% of the time/requires cueing 50 - 75% of the time. Uses single words/gestures.  Function - Social Interaction Social Interaction assist level: Interacts appropriately 75 - 89% of the time - Needs redirection for appropriate language or to initiate interaction.  Function - Problem Solving Problem solving assist level: Solves basic 50 - 74% of the time/requires cueing 25 - 49% of the time  Function - Memory Memory assist level: Recognizes or recalls 25 - 49% of the time/requires cueing 50 - 75% of the time Patient normally able to recall (first 3 days only): That he or she is in a hospital  Medical Problem List and Plan: 1.  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 Formulized individual treatment plan based on medical history, underlying problem and comorbidities. 2.  DVT Prophylaxis/Anticoagulation: Pharmaceutical: Lovenox,    -Vascular study with small right baker's cyst, otherwise unremarkable 3. Pain Management: Added Sportscreme for back pain.  4. Mood: LCSW to follow for evaluation and support  -paxil trial for depression.  5. Neuropsych: This patient is not capable of making decisions on her own behalf. 6. Skin/Wound Care: pressure relief measures. 7. Fluids/Electrolytes/Nutrition: Monitor I/O.  8. HTN: monitor BP bid--continue HCTZ.   Variable systolic or diastolic HTN ,off hctz now on toprol xl '100mg'$ ,BPs stabilizing Vitals:   06/06/16 1506 06/07/16 0505  BP: 134/65 135/61  Pulse: 74 75  Resp: 18 18  Temp: 98.4 F (36.9 C) 98.5 F (36.9 C)    9. Hypokalemia: resolved on  supplement.  K+ 4.2 on 4/16  11. NSTEMI: Will need follow up  NUC study in the future.  12. T2DM with peripheral neuropathy: Monitor BS ac/hs. Hgb A1c- 10.3- .      -some elevation over last 36 hours  -increase amaryl to '3mg'$ , resume metformin '500mg'$  qam started 4/18 CBG (last 3)   Recent Labs  06/06/16 1715 06/06/16 2219 06/07/16 0628  GLUCAP 114* 244* 153*   13.  ARF- prerenal azotemia  cont .45 NS qhs, EF 45% no signs overload, weights stable around 63kg  -follow up labs normal, poor fluid intake  IVF at noc discuss with speech about fluid intake after upgrade to thin liq LOS (Days) 13 A FACE TO FACE  EVALUATION WAS PERFORMED  Charlett Blake 06/07/2016, 7:34 AM

## 2016-06-07 NOTE — Progress Notes (Signed)
Pt has new IV placed due to leakage at site and the positional nature of the site. IV fluids restarted

## 2016-06-07 NOTE — Progress Notes (Signed)
Speech Language Pathology Daily Session Note  Patient Details  Name: Paige Stewart MRN: [id number] Date of Birth: 1950-02-27  Today's Date: 06/07/2016 SLP Individual Time: 4098-1191 SLP Individual Time Calculation (min): 45 min  Short Term Goals: Week 2: SLP Short Term Goal 1 (Week 2): Patient will consume current diet with minimal overt s/s of aspiration with Mod A verbal cues needed for use of swallowing compensatory strategies.  SLP Short Term Goal 2 (Week 2): Patient will utilize chin tuck with trials of thin liquids without overt s/s of aspiration in 75% of trials with Mod A verbal cues over 3 sessions prior to upgrade.  SLP Short Term Goal 3 (Week 2): Patient will utilize an increased vocal intensity at the word and phrase level to achieve ~75% intelligibility with Mod A verbal cues.  SLP Short Term Goal 4 (Week 2): Patient will self-monitor and correct verbal errors at the phrase level with Max A multimodal cues.  SLP Short Term Goal 5 (Week 2): Patient will attend to right field of enviornment during functional tasks with Mod A multimodal cues. SLP Short Term Goal 6 (Week 2): Patient will demonstrate functional problem solving for basic and familiar tasks with Mod A verbal cues.   Skilled Therapeutic Interventions: Skilled treatment session focused on dysphagia and speech goals. SLP facilitated session by providing Mod A verbal cues for use of small bites/sips, self-monitor and correct anterior spillage, and a slow rate of self-feeding with breakfast meal of Dys. 2 textures with thin liquids. Patient consumed meal with minimal overt s/s of aspiration. Recommend patient continue current diet. Patient also demonstrated what appeared to be echolalia with evident word-finding difficulty that required Max A multimodal cues to self-monitor and correct errors. Patient also required Min A verbal cues for use of speech intelligibility strategies to achieve ~75% intelligibility at the phrase level.  Patient left upright in bed with all needs within reach. Continue with current plan of care.      Function:  Eating Eating   Modified Consistency Diet: Yes Eating Assist Level: Supervision or verbal cues;Helper checks for pocketed food;Set up assist for   Eating Set Up Assist For: Opening containers       Cognition Comprehension Comprehension assist level: Understands basic 50 - 74% of the time/ requires cueing 25 - 49% of the time  Expression   Expression assist level: Expresses basic 25 - 49% of the time/requires cueing 50 - 75% of the time. Uses single words/gestures.  Social Interaction Social Interaction assist level: Interacts appropriately 75 - 89% of the time - Needs redirection for appropriate language or to initiate interaction.  Problem Solving Problem solving assist level: Solves basic 50 - 74% of the time/requires cueing 25 - 49% of the time  Memory Memory assist level: Recognizes or recalls 25 - 49% of the time/requires cueing 50 - 75% of the time    Pain Pain Assessment Pain Assessment: No/denies pain  Therapy/Group: Individual Therapy  Kaladin Noseworthy 06/07/2016, 3:57 PM

## 2016-06-07 NOTE — Progress Notes (Signed)
Social Work Patient ID: Paige Stewart, female   DOB: Jan 11, 1951, 66 y.o.   MRN: [id number]  Met with pt and daughter who is here to observe in therapies. Discussed team conference progression toward her goals of now min-mod assist and discharge 4/27. Gave her the letter form MD to extend pt's visa since it is expiring. Daughter feels pt has a fear issue and is not pushing herself as much as she can. Discussed options of hired assist versues NHP if pt's insurance has this coverage. Daughter feels it she were to go to a NH she would become more depressed. She will talk with people who will be able to assist her and plans on working nights and her husband works days. Will look into coverage for NH and see waht Daughter finds out in regards to help at home for Mom. Work on a safe plan for her at discharge.

## 2016-06-07 NOTE — Progress Notes (Signed)
Physical Therapy Session Note  Patient Details  Name: Paige Stewart MRN: [id number] Date of Birth: 1950-05-11  Today's Date: 06/07/2016 PT Individual Time: 0830-0912 PT Individual Time Calculation (min): 42 min   Short Term Goals: Week 2:  PT Short Term Goal 1 (Week 2): Pt demonstrate static sitting balance with LUE support x3 minutes with supervision.  PT Short Term Goal 2 (Week 2): Pt will initiate stair training for NMR and strengthening PT Short Term Goal 3 (Week 2): Pt will propel w/c x50' with min assist   Skilled Therapeutic Interventions/Progress Updates:    no c/o pain.  Session focus on activity tolerance, transfers, and balance during self care tasks.    Pt transitions supine>sit to L side with HOB elevated/bedrails with min assist to bring RLE to EOB.  Stand/pivot to w/c with max assist to R side.  Pt requesting to use toilet, max assist for transfer to toilet for incontinent BM.  Pt performs hygiene, therapist manages clothing.  Pt washes face/hands at sink with set up assist.  Dressing from w/c level with assist for threading RLE/RUE and to pull pants over hips with sit<>stand.  Pt able to perform sit<>stand with UE support on sink with mod assist.  Pt transferred to recliner at end of session with max assist and positioned to comfort with call bell in reach and needs met.   Therapy Documentation Precautions:  Precautions Precautions: Fall Precaution Comments: dense R hemiparesis Restrictions Weight Bearing Restrictions: No  See Function Navigator for Current Functional Status.   Therapy/Group: Individual Therapy  Mikella Linsley E Penven-Crew 06/07/2016, 9:15 AM

## 2016-06-07 NOTE — Progress Notes (Addendum)
Occupational Therapy Session Note  Patient Details  Name: Paige Stewart MRN: [id number] Date of Birth: 1950/11/23  Today's Date: 06/07/2016 OT Individual Time: 1445-1534 OT Individual Time Calculation (min): 49 min    Short Term Goals: Week 2:  OT Short Term Goal 1 (Week 2): Pt will dress UB with mod A in order to reduce caregiver burden OT Short Term Goal 2 (Week 2): Pt will recall hemi-dressing techniques 2 consecutive days with min questioning cues OT Short Term Goal 3 (Week 2): Pt will complete sit<>stand at the sink in preparation for ADL tasks with Mod A to decrease caregiver burger  Skilled Therapeutic Interventions/Progress Updates:    OT treatment session with focus on LB dressing, scooting, sit<>stand, pt/family ed and R NMR. Pt greeted asleep in bed with daughter present. Pt incontinent of bowel, requiring Max A to roll L and Mod A > R for total A peri-care and brief change. Assistance to thread R pant leg, then pt able to thread L pant leg with verbal cues to initiate task. OT provided R LE support, as pt initiated hip bride to pull pants over hips. Pt unable to clear R hip far enough off of bed requiring mod A for rolling and max A to pull up pants. 2/2 BP issues earlier today, Orthostatics taken as noted below. Sit<>stand w/ Mod A, able to maintain standing for 1 minute while BP took. Pt experienced a more the 20 hhmg drop in systolic, indicated orthostatic hypotension. Lateral scooting along EOB with max A to initiate and motor plan lateral movement.  Brought pt into gravity eliminated side-lying position for R NMR, shoulder flex/ext, scap protraction/retraction/elevation/depression,  Elbow flex/ext with joint input provided to bring pt through full ROM- no muscle activation noted. Educated pt's daughter on there-ex; demonstrated understanding. Pt left in sidelying position with needs met and bed alarm on.     06/07/16 1500  Vital Signs  Pulse Rate Source Dinamap  BP Location Left  Arm  BP Method Automatic  Patient Position (if appropriate) Orthostatic Vitals  Orthostatic Lying   BP- Lying 119/69  Pulse- Lying 82  Orthostatic Sitting  BP- Sitting 120/66  Pulse- Sitting 84  Orthostatic Standing at 3 minutes  BP- Standing at 3 minutes 96/59  Pulse- Standing at 3 minutes 85    Therapy Documentation Precautions:  Precautions Precautions: Fall Precaution Comments: dense R hemiparesis Restrictions Weight Bearing Restrictions: No Pain: Pain Assessment Pain Assessment: No/denies pain ADL: ADL ADL Comments: Please see functional navigator    See Function Navigator for Current Functional Status.   Therapy/Group: Individual Therapy  Valma Cava 06/07/2016, 4:05 PM

## 2016-06-08 ENCOUNTER — Inpatient Hospital Stay (HOSPITAL_COMMUNITY): Payer: No Typology Code available for payment source | Admitting: Occupational Therapy

## 2016-06-08 ENCOUNTER — Inpatient Hospital Stay (HOSPITAL_COMMUNITY): Payer: No Typology Code available for payment source | Admitting: Speech Pathology

## 2016-06-08 ENCOUNTER — Inpatient Hospital Stay (HOSPITAL_COMMUNITY): Payer: No Typology Code available for payment source | Admitting: Physical Therapy

## 2016-06-08 LAB — GLUCOSE, CAPILLARY
GLUCOSE-CAPILLARY: 190 mg/dL — AB (ref 65–99)
GLUCOSE-CAPILLARY: 80 mg/dL (ref 65–99)
GLUCOSE-CAPILLARY: 227 mg/dL — AB (ref 65–99)
Glucose-Capillary: 246 mg/dL — ABNORMAL HIGH (ref 65–99)
Glucose-Capillary: 130 mg/dL — ABNORMAL HIGH (ref 65–99)
Glucose-Capillary: 123 mg/dL — ABNORMAL HIGH (ref 65–99)

## 2016-06-08 MED ORDER — METFORMIN HCL 500 MG PO TABS
1000.0000 mg | ORAL_TABLET | Freq: Every day | ORAL | Status: DC
  Administered 2016-06-08 – 2016-06-11 (×4): 1000 mg via ORAL
  Filled 2016-06-08 (×4): qty 2

## 2016-06-08 MED ORDER — CALCIUM POLYCARBOPHIL 625 MG PO TABS
1250.0000 mg | ORAL_TABLET | Freq: Every day | ORAL | Status: DC
  Administered 2016-06-08 – 2016-06-11 (×4): 1250 mg via ORAL
  Filled 2016-06-08 (×4): qty 2

## 2016-06-08 MED ORDER — SENNOSIDES-DOCUSATE SODIUM 8.6-50 MG PO TABS: 2.0000 | ORAL_TABLET | Freq: Every evening | ORAL | Status: DC | PRN

## 2016-06-08 MED ORDER — ENOXAPARIN SODIUM 40 MG/0.4ML ~~LOC~~ SOLN
40.0000 mg | SUBCUTANEOUS | Status: DC
  Administered 2016-06-10 – 2016-06-16 (×7): 40 mg via SUBCUTANEOUS
  Filled 2016-06-08 (×7): qty 0.4

## 2016-06-08 NOTE — Progress Notes (Signed)
Physical Therapy Session Note  Patient Details  Name: Paige Stewart MRN: [id number] Date of Birth: 26-Dec-1950  Today's Date: 06/08/2016 PT Individual Time: 1100-1200 PT Individual Time Calculation (min): 60 min   Short Term Goals: Week 2:  PT Short Term Goal 1 (Week 2): Pt demonstrate static sitting balance with LUE support x3 minutes with supervision.  PT Short Term Goal 2 (Week 2): Pt will initiate stair training for NMR and strengthening PT Short Term Goal 3 (Week 2): Pt will propel w/c x50' with min assist   Skilled Therapeutic Interventions/Progress Updates:    no c/o pain.  Session focus on NMR, activity tolerance, transfers, and w/c propulsion.   Pt performs squat/pivot w/c<>nustep with mod assist with max cues for head/hips relationship.  nustep x4 minutes with BLEs and LUE at level 1, +3 minutes with 4 extremities (hand splint, and leg splint on R) for activity tolerance, reciprocal stepping pattern, and forced use.  Squat/pivot w/c<>therapy mat again with mod assist (to R and L) with slightly uneven surfaces and max cues for head/hip relationship and to continue transfer until all the way on sitting surface.  Pt requesting to toilet urgently.  Total assist for transfer to [facility] over toilet, clothing management, and hygiene.  Max assist for transfer back to w/c.  W/c propulsion x60' with mod assist for L hemi technique and max cues for attention to task.  Pt returned to room at requested back to bed, mod assist for squat/pivot back to chair with max cues as above.  Sit>side lying with mod assist and pt repositions self in bed (trendellenberg assist) with mod cues.  Call bell in reach and needs met.  Therapy Documentation Precautions:  Precautions Precautions: Fall Precaution Comments: dense R hemiparesis Restrictions Weight Bearing Restrictions: No   See Function Navigator for Current Functional Status.   Therapy/Group: Individual Therapy  Doroteo Nickolson E Penven-Crew 06/08/2016,  11:22 AM

## 2016-06-08 NOTE — Progress Notes (Signed)
Patient reported to be having liquid stools per PT-will discontinue Senna.  Also reported to be having hematuria--will check UA/UCS. Hold Lovenox for 24 hours.

## 2016-06-08 NOTE — Progress Notes (Signed)
Speech Language Pathology Daily Session Note  Patient Details  Name: Paige Stewart MRN: [id number] Date of Birth: [birth year]  Today's Date: 06/08/2016 SLP Individual Time: 1430-1500 SLP Individual Time Calculation (min): 30 min  Short Term Goals: Week 2: SLP Short Term Goal 1 (Week 2): Patient will consume current diet with minimal overt s/s of aspiration with Mod A verbal cues needed for use of swallowing compensatory strategies.  SLP Short Term Goal 2 (Week 2): Patient will utilize chin tuck with trials of thin liquids without overt s/s of aspiration in 75% of trials with Mod A verbal cues over 3 sessions prior to upgrade.  SLP Short Term Goal 3 (Week 2): Patient will utilize an increased vocal intensity at the word and phrase level to achieve ~75% intelligibility with Mod A verbal cues.  SLP Short Term Goal 4 (Week 2): Patient will self-monitor and correct verbal errors at the phrase level with Max A multimodal cues.  SLP Short Term Goal 5 (Week 2): Patient will attend to right field of enviornment during functional tasks with Mod A multimodal cues. SLP Short Term Goal 6 (Week 2): Patient will demonstrate functional problem solving for basic and familiar tasks with Mod A verbal cues.   Skilled Therapeutic Interventions:  Skilled treatment session focused on cognitive goals. Patient attended to right field of environment during a basic calendar making task with Mod I but required Max A multimodal cues for orientation to time after a 1-2 minute delay throughout session. Patient was ~75% intelligible at the phrase and sentence level due to an increased vocal intensity but continues to demonstrate intermittent word-finding difficulty during basic conversation. Patient transferred back to bed at end of session and left with RN present. Continue with current plan of care.      Function:    Cognition Comprehension Comprehension assist level: Understands basic 50 - 74% of the time/ requires  cueing 25 - 49% of the time  Expression   Expression assist level: Expresses basic 50 - 74% of the time/requires cueing 25 - 49% of the time. Needs to repeat parts of sentences.  Social Interaction Social Interaction assist level: Interacts appropriately 75 - 89% of the time - Needs redirection for appropriate language or to initiate interaction.  Problem Solving Problem solving assist level: Solves basic 50 - 74% of the time/requires cueing 25 - 49% of the time  Memory Memory assist level: Recognizes or recalls 25 - 49% of the time/requires cueing 50 - 75% of the time    Pain No/Denies Pain   Therapy/Group: Individual Therapy  [name] 06/08/2016, 3:18 PM

## 2016-06-08 NOTE — Progress Notes (Signed)
Occupational Therapy Session Note  Patient Details  Name: Paige Stewart MRN: [id number] Date of Birth: 07-Oct-1950  Today's Date: 06/08/2016  Session 1 OT Individual Time: 0900-1000 OT Individual Time Calculation (min): 60 min   Session 2 OT Individual Time: 1305-1400 OT Individual Time Calculation (min): 55 min   Short Term Goals: Week 2:  OT Short Term Goal 1 (Week 2): Pt will dress UB with mod A in order to reduce caregiver burden OT Short Term Goal 2 (Week 2): Pt will recall hemi-dressing techniques 2 consecutive days with min questioning cues OT Short Term Goal 3 (Week 2): Pt will complete sit<>stand at the sink in preparation for ADL tasks with Mod A to decrease caregiver burden  Skilled Therapeutic Interventions/Progress Updates:  Session 1   OT treatment session focused on modified bathing/dressing, transfer training, and pt/family education. Pt completed stand-pivot transfer to R with Max A + manual facilitation to elicit pivot. Pt incontinent of bowel and when asked if she needed to go more pt said yes. Squat-pivot transfer to R onto regular toilet with Total A. Pt unable to maintain sitting posture safely onto toilet, so stedy placed around pt for support. Pt had loose BM requiring total A for toileting. Stedy used to transfer pt into shower. Hand-over hand assist to integrate R UE into bathing tasks, and total A to bring R LEt into figure four position to wash feet and lower leg. Pt able to maintain sitting posture with close supervision and intermittent min A for lateral LOB to R. Max A sit <>stand while pt's daughter assisted with washing buttocks. Pt then began having loose BM in shower without warning. Stedy used to transfer pt back to toilet with total A. Total A for toileting. UB and LB dressing completed sitting on toilet with total A 2/2 time management. Transferred pt to wc with stedy and pt left with family present and safety belt on.   Session 2 Pt finishing lunch upon  OT arrival with nursing present. Pt completed squat0-pivot transfer to R w/ Max A. Addressed LB dressing techniques to don shoes. Pt able to achieve figure 4 position with L LE and place sandal on foot with min A for sitting balance. Hand-over hand assist to bring R foot over L knee, then mod A to place sandal 2/2 LOB with lean. Pt reported need for bathroom, stedyused for urgency with Min A sit<>stand. Pt with some incontinence in brief but finished loose BM in toilet. Pt attempted toileting with L hip hike and was able to do some on her own. Assistance for thoroughness and clothing management. Informed RN of loose stools. Stand-pivot >L with facilitation for weight shift. Addressed one-arm wc propulsion with vc to coordinate L UE and L LLE to advance w/c forward, and vc for technique to turn wc. Pt returned to room at end of session and left with safety belt on and needs met.   Therapy Documentation Precautions:  Precautions Precautions: Fall Precaution Comments: dense R hemiparesis Restrictions Weight Bearing Restrictions: No Pain:  Denies pain ADL: ADL ADL Comments: Please see functional navigator  See Function Navigator for Current Functional Status.   Therapy/Group: Individual Therapy  Valma Cava 06/08/2016, 4:20 PM

## 2016-06-08 NOTE — Progress Notes (Addendum)
Subjective/Complaints: States she didn't drink water, states she enjoys therapy when questioned.  Doesn't initiate conversation ROS: Limited due cognitive   Objective: Vital Signs: Blood pressure 136/71, pulse 72, temperature 98.6 F (37 C), temperature source Oral, resp. rate 17, height _0  (1.499 m), weight 65.6 kg (144 lb 11.2 oz), SpO2 98 %. No results found. Results for orders placed or performed during the hospital encounter of 05/25/16 (from the past 72 hour(s))  Glucose, capillary     Status: Abnormal   Collection Time: 06/05/16 11:51 AM  Result Value Ref Range   Glucose-Capillary 241 (H) 65 - 99 mg/dL  Glucose, capillary     Status: Abnormal   Collection Time: 06/05/16  4:49 PM  Result Value Ref Range   Glucose-Capillary 114 (H) 65 - 99 mg/dL  Glucose, capillary     Status: Abnormal   Collection Time: 06/05/16 10:06 PM  Result Value Ref Range   Glucose-Capillary 221 (H) 65 - 99 mg/dL  Glucose, capillary     Status: Abnormal   Collection Time: 06/06/16  6:54 AM  Result Value Ref Range   Glucose-Capillary 144 (H) 65 - 99 mg/dL  Glucose, capillary     Status: Abnormal   Collection Time: 06/06/16  8:38 AM  Result Value Ref Range   Glucose-Capillary 130 (H) 65 - 99 mg/dL  Basic metabolic panel     Status: Abnormal   Collection Time: 06/06/16 12:01 PM  Result Value Ref Range   Sodium 135 135 - 145 mmol/L   Potassium 4.7 3.5 - 5.1 mmol/L   Chloride 104 101 - 111 mmol/L   CO2 23 22 - 32 mmol/L   Glucose, Bld 179 (H) 65 - 99 mg/dL   BUN 17 6 - 20 mg/dL   Creatinine, Ser 0.73 0.44 - 1.00 mg/dL   Calcium 9.3 8.9 - 10.3 mg/dL   GFR calc non Af Amer >60 >60 mL/min   GFR calc Af Amer >60 >60 mL/min    Comment: (NOTE) The eGFR has been calculated using the CKD EPI equation. This calculation has not been validated in all clinical situations. eGFR's persistently <60 mL/min signify possible Chronic Kidney Disease.    Anion gap 8 5 - 15  Glucose, capillary     Status:  Abnormal   Collection Time: 06/06/16 12:18 PM  Result Value Ref Range   Glucose-Capillary 179 (H) 65 - 99 mg/dL  Glucose, capillary     Status: Abnormal   Collection Time: 06/06/16  5:15 PM  Result Value Ref Range   Glucose-Capillary 114 (H) 65 - 99 mg/dL  Glucose, capillary     Status: Abnormal   Collection Time: 06/06/16 10:19 PM  Result Value Ref Range   Glucose-Capillary 244 (H) 65 - 99 mg/dL  Glucose, capillary     Status: Abnormal   Collection Time: 06/07/16  6:28 AM  Result Value Ref Range   Glucose-Capillary 153 (H) 65 - 99 mg/dL  Glucose, capillary     Status: Abnormal   Collection Time: 06/07/16 12:10 PM  Result Value Ref Range   Glucose-Capillary 155 (H) 65 - 99 mg/dL     HEENT: Normocephalic, atraumatic Cardio:RRR. Resp: CTA bilaterally. Normal effort GI: BS positive and ND Skin:   Intact. Warm and dry. Neuro: Flat, word finding deficits.  Motor 0 /5 in RUE and 2- hip/knee ext synergy RLE Musc/Skel:  No edema. No tenderness.no pain with ROM RIght wrist , no effusion or erythema, mild medial R ankle pain with palp no  pain with ROM, no effusion  Gen NAD. Vital signs reviewed.   Assessment/Plan: 1. Functional deficits secondary to Basal ganglia infarct which require 3+ hours per day of interdisciplinary therapy in a comprehensive inpatient rehab setting. Physiatrist is providing close team supervision and 24 hour management of active medical problems listed below. Physiatrist and rehab team continue to assess barriers to discharge/monitor patient progress toward functional and medical goals. FIM: Function - Bathing Position: Shower Body parts bathed by patient: Right arm, Chest, Abdomen, Front perineal area, Right upper leg, Left upper leg Body parts bathed by helper: Left arm, Buttocks, Right lower leg, Back Assist Level: Touching or steadying assistance(Pt > 75%)  Function- Upper Body Dressing/Undressing What is the patient wearing?: Pull over shirt/dress Pull  over shirt/dress - Perfomed by patient: Put head through opening, Pull shirt over trunk, Thread/unthread left sleeve Pull over shirt/dress - Perfomed by helper: Thread/unthread right sleeve Button up shirt - Perfomed by patient: Thread/unthread right sleeve Button up shirt - Perfomed by helper: Thread/unthread left sleeve, Button/unbutton shirt, Pull shirt around back Assist Level:  (Min-Mod A) Function - Lower Body Dressing/Undressing What is the patient wearing?: Pants Position: Wheelchair/chair at sink Pants- Performed by patient: Thread/unthread left pants leg Pants- Performed by helper: Thread/unthread right pants leg, Pull pants up/down, Fasten/unfasten pants Non-skid slipper socks- Performed by helper: Don/doff right sock, Don/doff left sock Assist for footwear: Dependant Assist for lower body dressing:  (Total A)  Function - Toileting Toileting activity did not occur: No continent bowel/bladder event Toileting steps completed by patient: Performs perineal hygiene Toileting steps completed by helper: Adjust clothing prior to toileting, Adjust clothing after toileting Toileting Assistive Devices: Grab bar or rail Assist level:  (max)  Function - Air cabin crew transfer activity did not occur:  (not attempted) Toilet transfer assistive device: Elevated toilet seat/BSC over toilet, Grab bar Mechanical lift: Stedy Assist level to toilet: Maximal assist (Pt 25 - 49%/lift and lower) Assist level from toilet: Maximal assist (Pt 25 - 49%/lift and lower)  Function - Chair/bed transfer Chair/bed transfer method: Squat pivot Chair/bed transfer assist level: Maximal assist (Pt 25 - 49%/lift and lower) Chair/bed transfer assistive device: Armrests Chair/bed transfer details: Verbal cues for precautions/safety, Verbal cues for safe use of DME/AE, Tactile cues for placement, Manual facilitation for placement, Manual facilitation for weight shifting  Function - Locomotion:  Wheelchair Will patient use wheelchair at discharge?: Yes Type: Manual Max wheelchair distance: 150 Assist Level: Dependent (Pt equals 0%) Assist Level: Dependent (Pt equals 0%) Assist Level: Dependent (Pt equals 0%) Turns around,maneuvers to table,bed, and toilet,negotiates 3% grade,maneuvers on rugs and over doorsills: No Function - Locomotion: Ambulation Assistive device: Rail in hallway Max distance: 10 Assist level: 2 helpers (total for gait, +2 for w/c follow) Assist level: 2 helpers Walk 50 feet with 2 turns activity did not occur: Safety/medical concerns Walk 150 feet activity did not occur: Safety/medical concerns Walk 10 feet on uneven surfaces activity did not occur: Safety/medical concerns  Function - Comprehension Comprehension: Auditory Comprehension assist level: Understands basic 50 - 74% of the time/ requires cueing 25 - 49% of the time  Function - Expression Expression: Verbal Expression assist level: Expresses basic 25 - 49% of the time/requires cueing 50 - 75% of the time. Uses single words/gestures.  Function - Social Interaction Social Interaction assist level: Interacts appropriately 75 - 89% of the time - Needs redirection for appropriate language or to initiate interaction.  Function - Problem Solving Problem solving assist level: Solves basic  50 - 74% of the time/requires cueing 25 - 49% of the time  Function - Memory Memory assist level: Recognizes or recalls 25 - 49% of the time/requires cueing 50 - 75% of the time Patient normally able to recall (first 3 days only): That he or she is in a hospital  Medical Problem List and Plan: 1.  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 2.  DVT Prophylaxis/Anticoagulation: Pharmaceutical: Lovenox,    -Vascular study with small right baker's cyst, otherwise unremarkable 3. Pain Management: Added Sportscreme for back pain.  4. Mood: LCSW to follow for  evaluation and support  -paxil trial for depression.  5. Neuropsych: This patient is not capable of making decisions on her own behalf. Ask neuropsych to eval, poor initiation difficulty following PT instructions, ? Apraxia , post stroke depression 6. Skin/Wound Care: pressure relief measures. 7. Fluids/Electrolytes/Nutrition: Monitor I/O.  8. HTN: monitor BP bid--continue HCTZ.   Variable systolic or diastolic HTN ,off hctzOrthostatic episode yesterday required bolus decrease toprol dose to 4m Vitals:   06/07/16 1418 06/08/16 0517  BP: 119/69 136/71  Pulse: 82 72  Resp: 18 17  Temp: 98.8 F (37.1 C) 98.6 F (37 C)    9. Hypokalemia: resolved on  supplement.  K+ 4.2 on 4/16  11. NSTEMI: Will need follow up NUC study in the future.  12. T2DM with peripheral neuropathy: Monitor BS ac/hs. Hgb A1c- 10.3- .      -some elevation over last 36 hours  -increase amaryl to 364mIncrease  metformin 100073mam started 4/19 CBG (last 3)   Recent Labs  06/06/16 2219 06/07/16 0628 06/07/16 1210  GLUCAP 244* 153* 155*   13.  ARF- prerenal azotemia  cont .45 NS qhs, EF 45% no signs overload, weights stable around 63kg  -follow up labs normal, poor fluid intake  IVF at noc go back to 20m68m , recorded po fluids only 360ml26m8 LOS (Days) 14 A FACE TO FACE EVALUATION WAS PERFORMED  Timmie Calix E 06/08/2016, 7:39 AM

## 2016-06-08 NOTE — Patient Care Conference (Signed)
Inpatient RehabilitationTeam Conference and Plan of Care Update Date: 06/07/2016   Time: 11:00 AM    Patient Name: Paige Stewart      Medical Record Number: [id number]  Date of Birth: May 17, 1950 Sex: Female         Room/Bed: 4W18C/4W18C-01 Payor Info: Payor: [employer] / Plan: M[employer] / Product Type: *No Product type* /    Admitting Diagnosis: L MCA Infarct   Admit Date/Time:  05/25/2016  6:49 PM Admission Comments: No comment available   Primary Diagnosis:  <principal problem not specified> Principal Problem: <principal problem not specified>  Patient Active Problem List   Diagnosis Date Noted  . Poorly controlled type 2 diabetes mellitus with peripheral neuropathy (HCC)   . Right hemiplegia (HCC)   . Benign essential HTN   . E. coli UTI   . NSTEMI (non-ST elevated myocardial infarction) (HCC)   . Diabetic peripheral neuropathy (HCC)   . Basal ganglia infarction (HCC) 05/25/2016  . Type 2 diabetes mellitus without complication, without long-term current use of insulin (HCC)   . Urinary tract infection without hematuria   . Demand ischemia (HCC)   . Hypertension   . Acute right hemiparesis (HCC)   . 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    Expected Discharge Date: Expected Discharge Date: 06/16/16  Team Members Present: Physician leading conference: Dr. Claudette Laws Social Worker Present: Dossie Der, LCSW Nurse Present: Ronny Bacon, RN PT Present: Teodoro Kil, PT OT Present: Kearney Hard, OT SLP Present: Feliberto Gottron, SLP PPS Coordinator present : Tora Duck, RN, CRRN     Current Status/Progress Goal Weekly Team Focus  Medical   No UE motor improvement , RLE weakness minimal improvement, Renal failure improved  increased participation with PT, OT, adequate fluid intake hydration without IVF  increase po fluids   Bowel/Bladder   incontinent of bowel and bladder 70% of time  improve continence  streghten pt to improve ability to get to  bathroom in time for continance   Swallow/Nutrition/ Hydration   Dys. 2 textures with thin liquids via straw, Min-Mod A for use of strategies   Min A with least restrictive diet  tolerance of current diet, increased use of swallowing strategies    ADL's   Total A LB dressing, Max A UB dressing, Max A squat-pivot  S standing balance, dressing, toileting; min A tub transfers, toilet transfers  R NMR, standing balance, transfers, initiation, motor planning, pt/family ed   Mobility   max overall  min overall  NMR, sitting balance, activity tolerance, transfers, w/c mobility   Communication   Mod A for use of speech intelligibility strategies, word-finding deficits   Min A  increased vocal intensity, self-monitoring and correcting verbal errors    Safety/Cognition/ Behavioral Observations  Mod-Max A  Min A  sustained attention, problem solving, recall    Pain   denies  continue throughout rehabiltion without skin difficulties  continued improvement iof strenght    Skin   pt do not have any skin concerns at this time  continue good skin condition  improve pt mobility to decrease incontinant episodes      *See Care Plan and progress notes for long and short-term goals.  Barriers to Discharge: heavy physical assist, 3rd floor apt    Possible Resolutions to Barriers:  ?SNF after CIR    Discharge Planning/Teaching Needs:  Daughter is a Charity fundraiser will need to discuss realistic discharge plan-home versus NHP. can ambulance up to third floor but follow up appointments will have to  be ambulanced back and forth to      Team Discussion:  Progressing slowly, continent now. Sitting balance and attention better. Motor planning issues. Downgraded goals to mod assist level. MD managing medical issues. Daughter here to attend therapies with pt. Does more and tries harder when daughter is here. See if insurance covers NHP-travel policy  Revisions to Treatment Plan:  DC 4/27   Continued Need for Acute  Rehabilitation Level of Care: The patient requires daily medical management by a physician with specialized training in physical medicine and rehabilitation for the following conditions: Daily direction of a multidisciplinary physical rehabilitation program to ensure safe treatment while eliciting the highest outcome that is of practical value to the patient.: Yes Daily medical management of patient stability for increased activity during participation in an intensive rehabilitation regime.: Yes Daily analysis of laboratory values and/or radiology reports with any subsequent need for medication adjustment of medical intervention for : Neurological problems;Mood/behavior problems  Lucy Chris 06/08/2016, 9:49 AM

## 2016-06-08 NOTE — Progress Notes (Signed)
Social Work Patient ID: Paige Stewart, female   DOB: 1950-06-17, 66 y.o.   MRN: [id number]  Met with daughter to inform pt's insurance does not cover SNF due to it is a travel insurance.  She is currently looking for caregiver's to assist her at home with Mom. She is aware of her need to be here and begin participating in therapies. Will work on discharge needs.

## 2016-06-09 ENCOUNTER — Inpatient Hospital Stay (HOSPITAL_COMMUNITY): Payer: No Typology Code available for payment source | Admitting: Speech Pathology

## 2016-06-09 ENCOUNTER — Inpatient Hospital Stay (HOSPITAL_COMMUNITY): Payer: No Typology Code available for payment source | Admitting: Occupational Therapy

## 2016-06-09 ENCOUNTER — Inpatient Hospital Stay (HOSPITAL_COMMUNITY): Payer: No Typology Code available for payment source | Admitting: Physical Therapy

## 2016-06-09 LAB — URINALYSIS, ROUTINE W REFLEX MICROSCOPIC
Bilirubin Urine: NEGATIVE
GLUCOSE, UA: NEGATIVE mg/dL
Ketones, ur: NEGATIVE mg/dL
NITRITE: POSITIVE — AB
PH: 5 (ref 5.0–8.0)
PROTEIN: NEGATIVE mg/dL
Specific Gravity, Urine: 1.014 (ref 1.005–1.030)

## 2016-06-09 LAB — BASIC METABOLIC PANEL
ANION GAP: 8 (ref 5–15)
BUN: 16 mg/dL (ref 6–20)
CO2: 24 mmol/L (ref 22–32)
Calcium: 9.3 mg/dL (ref 8.9–10.3)
Chloride: 106 mmol/L (ref 101–111)
Creatinine, Ser: 0.71 mg/dL (ref 0.44–1.00)
GFR calc Af Amer: >60 mL/min (ref >60)
GLUCOSE: 134 mg/dL — AB (ref 65–99)
POTASSIUM: 3.9 mmol/L (ref 3.5–5.1)
Sodium: 138 mmol/L (ref 135–145)

## 2016-06-09 LAB — GLUCOSE, CAPILLARY
GLUCOSE-CAPILLARY: 109 mg/dL — AB (ref 65–99)
Glucose-Capillary: 133 mg/dL — ABNORMAL HIGH (ref 65–99)
Glucose-Capillary: 214 mg/dL — ABNORMAL HIGH (ref 65–99)
Glucose-Capillary: 88 mg/dL (ref 65–99)

## 2016-06-09 MED ORDER — SULFAMETHOXAZOLE-TRIMETHOPRIM 800-160 MG PO TABS
1.0000 | ORAL_TABLET | Freq: Two times a day (BID) | ORAL | Status: AC
  Administered 2016-06-09 – 2016-06-13 (×10): 1 via ORAL
  Filled 2016-06-09 (×12): qty 1

## 2016-06-09 NOTE — Progress Notes (Signed)
Subjective/Complaints: No issues overnite ROS: Limited due cognitive   Objective: Vital Signs: Blood pressure (!) 146/78, pulse 83, temperature 98.7 F (37.1 C), temperature source Oral, resp. rate 18, height _0  (1.499 m), weight 65.6 kg (144 lb 11.2 oz), SpO2 100 %. No results found. Results for orders placed or performed during the hospital encounter of 05/25/16 (from the past 72 hour(s))  Glucose, capillary     Status: Abnormal   Collection Time: 06/06/16  8:38 AM  Result Value Ref Range   Glucose-Capillary 130 (H) 65 - 99 mg/dL  Basic metabolic panel     Status: Abnormal   Collection Time: 06/06/16 12:01 PM  Result Value Ref Range   Sodium 135 135 - 145 mmol/L   Potassium 4.7 3.5 - 5.1 mmol/L   Chloride 104 101 - 111 mmol/L   CO2 23 22 - 32 mmol/L   Glucose, Bld 179 (H) 65 - 99 mg/dL   BUN 17 6 - 20 mg/dL   Creatinine, Ser 0.73 0.44 - 1.00 mg/dL   Calcium 9.3 8.9 - 10.3 mg/dL   GFR calc non Af Amer >60 >60 mL/min   GFR calc Af Amer >60 >60 mL/min    Comment: (NOTE) The eGFR has been calculated using the CKD EPI equation. This calculation has not been validated in all clinical situations. eGFR's persistently <60 mL/min signify possible Chronic Kidney Disease.    Anion gap 8 5 - 15  Glucose, capillary     Status: Abnormal   Collection Time: 06/06/16 12:18 PM  Result Value Ref Range   Glucose-Capillary 179 (H) 65 - 99 mg/dL  Glucose, capillary     Status: Abnormal   Collection Time: 06/06/16  5:15 PM  Result Value Ref Range   Glucose-Capillary 114 (H) 65 - 99 mg/dL  Glucose, capillary     Status: Abnormal   Collection Time: 06/06/16 10:19 PM  Result Value Ref Range   Glucose-Capillary 244 (H) 65 - 99 mg/dL  Glucose, capillary     Status: Abnormal   Collection Time: 06/07/16  6:28 AM  Result Value Ref Range   Glucose-Capillary 153 (H) 65 - 99 mg/dL  Glucose, capillary     Status: Abnormal   Collection Time: 06/07/16 12:10 PM  Result Value Ref Range   Glucose-Capillary 155 (H) 65 - 99 mg/dL  Glucose, capillary     Status: Abnormal   Collection Time: 06/07/16  4:23 PM  Result Value Ref Range   Glucose-Capillary 190 (H) 65 - 99 mg/dL  Glucose, capillary     Status: Abnormal   Collection Time: 06/07/16 10:26 PM  Result Value Ref Range   Glucose-Capillary 246 (H) 65 - 99 mg/dL  Glucose, capillary     Status: Abnormal   Collection Time: 06/08/16  6:33 AM  Result Value Ref Range   Glucose-Capillary 130 (H) 65 - 99 mg/dL  Glucose, capillary     Status: None   Collection Time: 06/08/16 12:09 PM  Result Value Ref Range   Glucose-Capillary 80 65 - 99 mg/dL  Glucose, capillary     Status: Abnormal   Collection Time: 06/08/16  5:08 PM  Result Value Ref Range   Glucose-Capillary 123 (H) 65 - 99 mg/dL  Glucose, capillary     Status: Abnormal   Collection Time: 06/08/16  9:12 PM  Result Value Ref Range   Glucose-Capillary 227 (H) 65 - 99 mg/dL  Basic metabolic panel     Status: Abnormal   Collection Time: 06/09/16  5:50  AM  Result Value Ref Range   Sodium 138 135 - 145 mmol/L   Potassium 3.9 3.5 - 5.1 mmol/L   Chloride 106 101 - 111 mmol/L   CO2 24 22 - 32 mmol/L   Glucose, Bld 134 (H) 65 - 99 mg/dL   BUN 16 6 - 20 mg/dL   Creatinine, Ser 0.71 0.44 - 1.00 mg/dL   Calcium 9.3 8.9 - 10.3 mg/dL   GFR calc non Af Amer >60 >60 mL/min   GFR calc Af Amer >60 >60 mL/min    Comment: (NOTE) The eGFR has been calculated using the CKD EPI equation. This calculation has not been validated in all clinical situations. eGFR's persistently <60 mL/min signify possible Chronic Kidney Disease.    Anion gap 8 5 - 15  Glucose, capillary     Status: Abnormal   Collection Time: 06/09/16  6:36 AM  Result Value Ref Range   Glucose-Capillary 133 (H) 65 - 99 mg/dL     HEENT: Normocephalic, atraumatic Cardio:RRR. Resp: CTA bilaterally. Normal effort GI: BS positive and ND Skin:   Intact. Warm and dry. Neuro: Flat, word finding deficits.  Motor 0 /5  in RUE and 2- hip/knee ext synergy RLE Musc/Skel:  No edema. No tenderness.no pain with ROM RIght wrist , no effusion or erythema, mild medial R ankle pain with palp no pain with ROM, no effusion  Gen NAD. Vital signs reviewed.   Assessment/Plan: 1. Functional deficits secondary to Basal ganglia infarct which require 3+ hours per day of interdisciplinary therapy in a comprehensive inpatient rehab setting. Physiatrist is providing close team supervision and 24 hour management of active medical problems listed below. Physiatrist and rehab team continue to assess barriers to discharge/monitor patient progress toward functional and medical goals. FIM: Function - Bathing Bathing activity did not occur: N/A Position: Shower Body parts bathed by patient: Right arm, Chest, Abdomen, Front perineal area, Right upper leg, Left upper leg Body parts bathed by helper: Left arm, Buttocks, Right lower leg, Back Assist Level: Touching or steadying assistance(Pt > 75%), Supervision or verbal cues  Function- Upper Body Dressing/Undressing Upper body dressing/undressing activity did not occur: N/A What is the patient wearing?: Hospital gown Pull over shirt/dress - Perfomed by patient: Thread/unthread right sleeve Pull over shirt/dress - Perfomed by helper: Thread/unthread right sleeve Button up shirt - Perfomed by patient: Thread/unthread right sleeve Button up shirt - Perfomed by helper: Thread/unthread left sleeve, Button/unbutton shirt, Pull shirt around back Assist Level: Touching or steadying assistance(Pt > 75%) Function - Lower Body Dressing/Undressing Lower body dressing/undressing activity did not occur: N/A What is the patient wearing?: Hospital Gown Position: Wheelchair/chair at sink Pants- Performed by patient: Thread/unthread right pants leg Pants- Performed by helper: Thread/unthread right pants leg Non-skid slipper socks- Performed by patient: Don/doff left sock Non-skid slipper socks-  Performed by helper: Don/doff right sock Socks - Performed by patient: Don/doff left sock Socks - Performed by helper: Don/doff right sock Assist for footwear: Dependant Assist for lower body dressing: Touching or steadying assistance (Pt > 75%)  Function - Toileting Toileting activity did not occur: No continent bowel/bladder event Toileting steps completed by patient: Performs perineal hygiene Toileting steps completed by helper: Adjust clothing prior to toileting, Performs perineal hygiene, Adjust clothing after toileting Toileting Assistive Devices: Grab bar or rail Assist level:  (total assist)  Function - Air cabin crew transfer activity did not occur:  (not attempted) Toilet transfer assistive device: Elevated toilet seat/BSC over toilet, Grab bar Mechanical lift: [employer]  Assist level to toilet: Total assist (Pt < 25%) Assist level from toilet: Maximal assist (Pt 25 - 49%/lift and lower)  Function - Chair/bed transfer Chair/bed transfer method: Squat pivot Chair/bed transfer assist level: Moderate assist (Pt 50 - 74%/lift or lower) Chair/bed transfer assistive device: Armrests Chair/bed transfer details: Verbal cues for precautions/safety, Verbal cues for safe use of DME/AE, Tactile cues for placement, Manual facilitation for placement, Manual facilitation for weight shifting  Function - Locomotion: Wheelchair Will patient use wheelchair at discharge?: Yes Type: Manual Max wheelchair distance: 60 Assist Level: Moderate assistance (Pt 50 - 74%) Assist Level: Moderate assistance (Pt 50 - 74%) Assist Level: Dependent (Pt equals 0%) Turns around,maneuvers to table,bed, and toilet,negotiates 3% grade,maneuvers on rugs and over doorsills: No Function - Locomotion: Ambulation Assistive device: Rail in hallway Max distance: 10 Assist level: 2 helpers (total for gait, +2 for w/c follow) Assist level: 2 helpers Walk 50 feet with 2 turns activity did not occur:  Safety/medical concerns Walk 150 feet activity did not occur: Safety/medical concerns Walk 10 feet on uneven surfaces activity did not occur: Safety/medical concerns  Function - Comprehension Comprehension: Auditory Comprehension assist level: Understands basic 50 - 74% of the time/ requires cueing 25 - 49% of the time  Function - Expression Expression: Verbal Expression assist level: Expresses basic 50 - 74% of the time/requires cueing 25 - 49% of the time. Needs to repeat parts of sentences.  Function - Social Interaction Social Interaction assist level: Interacts appropriately 75 - 89% of the time - Needs redirection for appropriate language or to initiate interaction.  Function - Problem Solving Problem solving assist level: Solves basic 50 - 74% of the time/requires cueing 25 - 49% of the time  Function - Memory Memory assist level: Recognizes or recalls 25 - 49% of the time/requires cueing 50 - 75% of the time Patient normally able to recall (first 3 days only): That he or she is in a hospital  Medical Problem List and Plan: 1.  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 2.  DVT Prophylaxis/Anticoagulation: Pharmaceutical: Lovenox,    -Vascular study with small right baker's cyst, otherwise unremarkable 3. Pain Management: Added Sportscreme for back pain.  4. Mood: LCSW to follow for evaluation and support  -paxil trial for depression.  5. Neuropsych: This patient is not capable of making decisions on her own behalf. Ask neuropsych to eval, poor initiation difficulty following PT instructions, ? Apraxia , post stroke depression 6. Skin/Wound Care: pressure relief measures. 7. Fluids/Electrolytes/Nutrition: Monitor I/O.  8. HTN: monitor BP bid--continue HCTZ.   Variable systolic or diastolic HTN ,off hctzOrthostatic episode yesterday required bolus decrease toprol dose to 102m Vitals:   06/08/16 1424 06/09/16 0534  BP:  (!) 154/71 (!) 146/78  Pulse: 83   Resp: 18   Temp: 98.3 F (36.8 C) 98.7 F (37.1 C)    9. Hypokalemia: resolved on  supplement.  K+ 4.2 on 4/16  11. NSTEMI: Will need follow up NUC study in the future.  12. T2DM with peripheral neuropathy: Monitor BS ac/hs. Hgb A1c- 10.3- .       -increase amaryl to 335mIncrease  metformin 100036mam started 4/19 CBG improving  (last 3)   Recent Labs  06/08/16 1708 06/08/16 2112 06/09/16 0636  GLUCAP 123* 227* 133*   13.  ARF- prerenal azotemia  cont .45 NS qhs, EF 45% no signs overload, weights stable around 63kg  -follow  up labs normal, poor fluid intake  IVF at noc go back to 59m/hr , recorded po fluids only 3610m4/18 LOS (Days) 15 A FACE TO FACE EVALUATION WAS PERFORMED  Katye Valek E 06/09/2016, 7:23 AM

## 2016-06-09 NOTE — Plan of Care (Signed)
Problem: RH Balance Goal: LTG Patient will maintain dynamic sitting balance (PT) LTG:  Patient will maintain dynamic sitting balance with assistance during mobility activities (PT)  Downgraded due to slow progress Goal: LTG Patient will maintain dynamic standing balance (PT) LTG:  Patient will maintain dynamic standing balance with assistance during mobility activities (PT)  Outcome: Not Applicable Date Met: 35/46/56 D/C'd due to slow progress  Problem: RH Bed Mobility Goal: LTG Patient will perform bed mobility with assist (PT) LTG: Patient will perform bed mobility with assistance, with/without cues (PT).  Downgraded due to slow progress  Problem: RH Bed to Chair Transfers Goal: LTG Patient will perform bed/chair transfers w/assist (PT) LTG: Patient will perform bed/chair transfers with assistance, with/without cues (PT).  Downgraded due to slow progress  Problem: RH Car Transfers Goal: LTG Patient will perform car transfers with assist (PT) LTG: Patient will perform car transfers with assistance (PT).  Downgraded due to slow progress  Problem: RH Ambulation Goal: LTG Patient will ambulate in controlled environment (PT) LTG: Patient will ambulate in a controlled environment, # of feet with assistance (PT).  Outcome: Not Applicable Date Met: 81/27/51 D/C'd due to not a focus at this time   Problem: RH Wheelchair Mobility Goal: LTG Patient will propel w/c in controlled environment (PT) LTG: Patient will propel wheelchair in controlled environment, # of feet with assist (PT)  Downgraded assist and distance due to slow progress Goal: LTG Patient will propel w/c in home environment (PT) LTG: Patient will propel wheelchair in home environment, # of feet with assistance (PT).  Downgraded due to slow progress Goal: LTG Patient will propel w/c in community environment (PT) LTG: Patient will propel wheelchair in community environment, # of feet with assist (PT)  Outcome: Not Applicable  Date Met: 70/01/74 D/C due to not a focus at this time.   Problem: RH Stairs Goal: LTG Patient will ambulate up and down stairs w/assist (PT) LTG: Patient will ambulate up and down # of stairs with assistance (PT)  Outcome: Not Applicable Date Met: 94/49/67 D/C due to not a focus at this time

## 2016-06-09 NOTE — Progress Notes (Signed)
Physical Therapy Weekly Progress Note  Patient Details  Name: Paige Stewart MRN: [id number] Date of Birth: [birth year]  Beginning of progress report period: June 02, 2016 End of progress report period: June 09, 2016  Today's Date: 06/09/2016 PT Individual Time: 8676-7209 PT Individual Time Calculation (min): 75 min   Patient has met 1 of 3 short term goals.  Pt continues to make very slow progress with therapy, question whether pt is motivated to make improvements with mobility.  Very minimal, if any, carryover noted with pt education regarding basic mobility such as squat/pivot transfers, and w/c mobility.  Pt continues to require max>total assist for squat/pivot transfers, despite demonstrating appropriate head/hips relationship and weight shifting for scooting L and R on therapy mat, and requires mod>max assist for w/c mobility.    Patient continues to demonstrate the following deficits muscle weakness, unbalanced muscle activation and decreased coordination, decreased attention to left, decreased problem solving, decreased safety awareness, decreased memory and delayed processing and decreased sitting balance, decreased standing balance, decreased postural control, hemiplegia and decreased balance strategies and therefore will continue to benefit from skilled PT intervention to increase functional independence with mobility.  Patient not progressing toward long term goals.  See goal revision..  Plan of care revisions: all goals either downgraded or discontinued.  Currently have goals set for min assist sitting balance, mod assist basic transfers (max for car), and min for w/c mobility in controlled/home environment.  Discontinued goals for standing, gait, stairs, and community w/c propulsion due to lack of progress in all areas and need to focus on transfers and w/c mobility for safe d/c home.   PT Short Term Goals Week 2:  PT Short Term Goal 1 (Week 2): Pt demonstrate static sitting balance  with LUE support x3 minutes with supervision.  PT Short Term Goal 1 - Progress (Week 2): Met PT Short Term Goal 2 (Week 2): Pt will initiate stair training for NMR and strengthening PT Short Term Goal 2 - Progress (Week 2): Not met PT Short Term Goal 3 (Week 2): Pt will propel w/c x50' with min assist  PT Short Term Goal 3 - Progress (Week 2): Not met Week 3:  PT Short Term Goal 1 (Week 3): =LTGs due to ELOS  Skilled Therapeutic Interventions/Progress Updates:    pt with no c/o pain.  Session focus on balance, transfers, and w/c mobility.    Pt transitions supine>sit with HOB elevated slightly and rail with min assist for RLE management.  Squat/pivot to R with max assist for head/hips relationship.  Pt washed face/hands at sink with set up assist and donned clean hospital gown with assist from PT.    W/C propulsion up to 100' with mod/max assist for L hemi technique.  Attempted max multimodal cues with PT placing LLE and facilitating its use to assist with steering, attempted max verbal cues for use of LLE, attempted visual only and tactile only cues with no carryover and pt continuing to run into the wall on the R side unless assist provided from PT.  When asked if her L foot hurt to put on the floor pt states no.  Transitioned to focus on squat/pivot transfers to decrease burden of care at home.  Pt transfers with squat/pivot to L and R (4 times in each direction) with max/total assist to facilitate lifting hips and to facilitate pivot.  PT provided extensive education via multimodal cues and demonstration, with no learning noted from pt.    Returned to room at  end of session, max assist to transfer to recliner, pt positioned to comfort with call bell in reach and needs met.   Therapy Documentation Precautions:  Precautions Precautions: Fall Precaution Comments: dense R hemiparesis Restrictions Weight Bearing Restrictions: No   See Function Navigator for Current Functional  Status.  Therapy/Group: Individual Therapy  Zaidyn Claire E Penven-Crew 06/09/2016, 11:02 AM

## 2016-06-09 NOTE — Progress Notes (Signed)
Speech Language Pathology Weekly Progress and Session Note  Patient Details  Name: Paige Stewart MRN: [id number] Date of Birth: [birth year]  Beginning of progress report period: June 02, 2016 End of progress report period: June 09, 2016  Today's Date: 06/09/2016 SLP Individual Time: 5945-8592 SLP Individual Time Calculation (min): 57 min and Today's Date: 06/09/2016 SLP Missed Time: 3 Minutes  Short Term Goals: Week 2: SLP Short Term Goal 1 (Week 2): Patient will consume current diet with minimal overt s/s of aspiration with Mod A verbal cues needed for use of swallowing compensatory strategies.  SLP Short Term Goal 1 - Progress (Week 2): Met SLP Short Term Goal 2 (Week 2): Patient will utilize chin tuck with trials of thin liquids without overt s/s of aspiration in 75% of trials with Mod A verbal cues over 3 sessions prior to upgrade.  SLP Short Term Goal 2 - Progress (Week 2): Met SLP Short Term Goal 3 (Week 2): Patient will utilize an increased vocal intensity at the word and phrase level to achieve ~75% intelligibility with Mod A verbal cues.  SLP Short Term Goal 3 - Progress (Week 2): Met SLP Short Term Goal 4 (Week 2): Patient will self-monitor and correct verbal errors at the phrase level with Max A multimodal cues.  SLP Short Term Goal 4 - Progress (Week 2): Met SLP Short Term Goal 5 (Week 2): Patient will attend to right field of enviornment during functional tasks with Mod A multimodal cues. SLP Short Term Goal 5 - Progress (Week 2): Met SLP Short Term Goal 6 (Week 2): Patient will demonstrate functional problem solving for basic and familiar tasks with Mod A verbal cues.  SLP Short Term Goal 6 - Progress (Week 2): Met    New Short Term Goals: Week 3: SLP Short Term Goal 1 (Week 3): Patient will consume Dys.2 textures and thin with minimal overt s/s of aspiration with Min A verbal cues needed for use of swallowing compensatory strategies.  SLP Short Term Goal 2 (Week 3):  Patient with demonstrate effective mastication and oral clearance of Dys.3 textures with Min A for 2 consecutive sessions to demonstrate readiness for upgrade.  SLP Short Term Goal 3 (Week 3): Patient will utilize an increased vocal intensity at the word and phrase level to achieve ~75% intelligibility with Min A verbal cues.  SLP Short Term Goal 4 (Week 3): Patient will self-monitor and correct verbal errors at the phrase level with Mod A multimodal cues.  SLP Short Term Goal 5 (Week 3): Patient will attend to right upper extremity during functional tasks with Min A multimodal cues. SLP Short Term Goal 6 (Week 3): Patient will demonstrate functional problem solving for basic and familiar tasks with Min A verbal cues.   Weekly Progress Updates: Patient has made functional gains and has met 6 out of 6 short term goals this reporting period due to improved swallow function as well as cognitive-linguistic skills. Currently, patient continues to require Mod assist for basic problem solving and attention to right upper extremity as well we use of safe swallow strategies with a Dys.2 texture and thin liquid diet. Patient and family education is ongoing at this time. Patient would benefit from continued skilled SLP intervention to maximize functional independence prior to discharge home with 24 hour supervision.   Intensity: Minumum of 1-2 x/day, 30 to 90 minutes Frequency: 3 to 5 out of 7 days Duration/Length of Stay: 4/27 Treatment/Interventions: Cognitive remediation/compensation;Cueing hierarchy;Environmental controls;Dysphagia/aspiration precaution training;Internal/external aids;Speech/Language facilitation;Therapeutic Activities;Functional tasks;Patient/family  education  Daily Session  Skilled Therapeutic Interventions:  Skilled treatment session focused on addressing cognitive-linguistic and dysphagia goals. SLP facilitated session by providing set-up and intermittent physical assist and Min-Mod  assist for recall and carryover of use of safe swallow strategies with Dys.2 textures and thin liquids via straw.  Patient with sneeze during breakfast while PO was in her mouth that resulted in coughing and throat clears throughout the rest of breakfast; as a result cuing was increased to Mod A to ensure safety.  Patient required Mod assist cues cues to attend to right upper extremity during meal as well as to state deficits post CVA.  Patient required Max assist multimodal cues to utilize external aids for recall of orientation information and therapy schedule.  Continue with current plan of care.      Function:   Eating Eating Eating activity did not occur: N/A Modified Consistency Diet: Yes Eating Assist Level: Set up assist for;Supervision or verbal cues;Helper checks for pocketed food;Helper scoops food on utensil;Help managing cup/glass   Eating Set Up Assist For: Opening containers;Cutting food Helper [place]al on Utensil: Occasionally     Cognition Comprehension Comprehension assist level: Understands basic 50 - 74% of the time/ requires cueing 25 - 49% of the time  Expression   Expression assist level: Expresses basic 50 - 74% of the time/requires cueing 25 - 49% of the time. Needs to repeat parts of sentences.  Social Interaction Social Interaction assist level: Interacts appropriately 75 - 89% of the time - Needs redirection for appropriate language or to initiate interaction.  Problem Solving Problem solving assist level: Solves basic 50 - 74% of the time/requires cueing 25 - 49% of the time  Memory Memory assist level: Recognizes or recalls 25 - 49% of the time/requires cueing 50 - 75% of the time   General    Pain Pain Assessment Pain Assessment: No/denies pain  Therapy/Group: Individual Therapy  Carmelia Roller., CCC-SLP [phone]  [place] 06/09/2016, 9:29 AM

## 2016-06-09 NOTE — Progress Notes (Signed)
Pt rested throughout the night without complaint. She has not had any residual and has not been in and out catechized for a urine specimen due to this. Pt has been incontinent of bladder several times throughout the night and has not had a bowel movement. Pt still needs to have collected a urine specimen and hemoccults X 3 for laboratory testing. Pt has not attempted to leave bed at anytime in the nigh. She remains in bed resting at present without complaint or distress with the bed alarm on.

## 2016-06-09 NOTE — Progress Notes (Signed)
Occupational Therapy Weekly Progress Note  Patient Details  Name: Paige Stewart MRN: [id number] Date of Birth: 11/29/1950  Beginning of progress report period: May 26, 2016 End of progress report period: June 09, 2016  Today's Date: 06/09/2016 OT Individual Time: 1400-1500 OT Individual Time Calculation (min): 60 min    Patient has met 2 of 3 short term goals.  Pt has made progress this week towards her BADL goals however, she has made limited progress with functional transfers. We have began working on bed level dressing, and pt is able to participate more in her self-care. She has demonstrated good carryover of hemi-techniques and is working on those modified strategies that will ultimately decrease caregiver burden.  Patient continues to demonstrate the following deficits: muscle weakness, decreased coordination and decreased motor planning and decreased sitting balance, decreased standing balance, decreased postural control, hemiplegia and decreased balance strategies and therefore will continue to benefit from skilled OT intervention to enhance overall performance with BADL and Reduce care partner burden.  Patient progressing toward long term goals..  Continue plan of care.  OT Short Term Goals Week 2:  OT Short Term Goal 1 (Week 2): Pt will dress UB with mod A in order to reduce caregiver burden OT Short Term Goal 1 - Progress (Week 2): Met OT Short Term Goal 2 (Week 2): Pt will recall hemi-dressing techniques 2 consecutive days with min questioning cues OT Short Term Goal 2 - Progress (Week 2): Met OT Short Term Goal 3 (Week 2): Pt will complete sit<>stand at the sink in preparation for ADL tasks with Mod A to decrease caregiver burden OT Short Term Goal 3 - Progress (Week 2): Progressing toward goal Week 3:  OT Short Term Goal 1 (Week 3): LTG=STG 2/2 estimated LOS  Skilled Therapeutic Interventions/Progress Updates:    OT treatment session focused on transfer training and  modified UB and LB dressing. Mod A with rails to elevate trunkk to come to long sitting in bed, with close supervision and intermittent min A  for static sitting.  Assistance to place R LE onto L knee, then pt able to thread R pant leg with  Overall min/Mod A for dynamic sitting balance in long sitting. Pt then able to lean back and place L LE into pant leg with close supervision. Stabilization to bridge hips to pull up pants. Pt able to pull up L side ,but required assistance for R side. Pt came to sitting EOB w/ min A, then was able to problem solve and place R UE into shirt using hemi techniques and overall Min A. Pt transferred bed>w/c using slide board with mod A to her stronger L side. Pt brought to the gym for continued slide board transfer training. Facilitated weight shifting and reciprocal scooting with stool placed under LE's so her feet could stabilize on the ground. Max/total A SB transfer to R, Mod A SB transfer to L. Pt returned to room at end of session and transferred back to bed in similar fashion. Bed alarm on and call bell in reach.    Therapy Documentation Precautions:  Precautions Precautions: Fall Precaution Comments: dense R hemiparesis Restrictions Weight Bearing Restrictions: No Pain:  denies pain ADL: ADL ADL Comments: Please see functional navigator  See Function Navigator for Current Functional Status.  Therapy/Group: Individual Therapy  Valma Cava 06/09/2016, 4:38 PM

## 2016-06-10 ENCOUNTER — Inpatient Hospital Stay (HOSPITAL_COMMUNITY): Payer: No Typology Code available for payment source | Admitting: Occupational Therapy

## 2016-06-10 LAB — GLUCOSE, CAPILLARY
GLUCOSE-CAPILLARY: 191 mg/dL — AB (ref 65–99)
Glucose-Capillary: 129 mg/dL — ABNORMAL HIGH (ref 65–99)
Glucose-Capillary: 110 mg/dL — ABNORMAL HIGH (ref 65–99)
Glucose-Capillary: 286 mg/dL — ABNORMAL HIGH (ref 65–99)
Glucose-Capillary: 54 mg/dL — ABNORMAL LOW (ref 65–99)
Glucose-Capillary: 74 mg/dL (ref 65–99)

## 2016-06-10 NOTE — Plan of Care (Signed)
Problem: RH Balance Goal: LTG: Patient will maintain dynamic sitting balance (OT) LTG:  Patient will maintain dynamic sitting balance with assistance during activities of daily living (OT)  Goal Downgraded- ESD 4/21 Goal: LTG Patient will maintain dynamic standing with ADLs (OT) LTG:  Patient will maintain dynamic standing balance with assist during activities of daily living (OT)   Outcome: Not Applicable Date Met: 48/59/27 Goal d/c- ESD 4/21  Problem: RH Eating Goal: LTG Patient will perform eating w/assist, cues/equip (OT) LTG: Patient will perform eating with assist, with/without cues using equipment (OT)  Goal Downgraded- ESD 4/21  Problem: RH Grooming Goal: LTG Patient will perform grooming w/assist,cues/equip (OT) LTG: Patient will perform grooming with assist, with/without cues using equipment (OT)  Goal Downgraded- ESD 4/21  Problem: RH Bathing Goal: LTG Patient will bathe with assist, cues/equipment (OT) LTG: Patient will bathe specified number of body parts with assist with/without cues using equipment (position)  (OT)  Goal Downgraded- ESD 4/21  Problem: RH Dressing Goal: LTG Patient will perform upper body dressing (OT) LTG Patient will perform upper body dressing with assist, with/without cues (OT).  Goal Downgraded- ESD 4/21 Goal: LTG Patient will perform lower body dressing w/assist (OT) LTG: Patient will perform lower body dressing with assist, with/without cues in positioning using equipment (OT)  Goal Downgraded- ESD 4/21  Problem: RH Toileting Goal: LTG Patient will perform toileting w/assist, cues/equip (OT) LTG: Patient will perform toiletiing (clothes management/hygiene) with assist, with/without cues using equipment (OT)  Goal Downgraded- ESD 4/21  Problem: RH Toilet Transfers Goal: LTG Patient will perform toilet transfers w/assist (OT) LTG: Patient will perform toilet transfers with assist, with/without cues using equipment (OT)  Goal Downgraded- ESD  4/21  Problem: RH Tub/Shower Transfers Goal: LTG Patient will perform tub/shower transfers w/assist (OT) LTG: Patient will perform tub/shower transfers with assist, with/without cues using equipment (OT)  Goal Downgraded- ESD 4/21  Comments: Goals Downgraded- ESD 4/21

## 2016-06-10 NOTE — Progress Notes (Signed)
Patient ID: Paige Stewart, female   DOB: [birth year], 66 y.o.   MRN: [id number]   06/10/16.  Paige Stewart is a 66 y.o. 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 Diabetes mellitus without complication (HCC)   . Hypertension   . Neuropathy      Subjective: No new complaints. No new problems.c/o mild R sided discomfort  Objective: Vital signs in last 24 hours: Temp:  [97.6 F (36.4 C)] 97.6 F (36.4 C) (04/20 1313) Pulse Rate:  [83] 83 (04/20 1313) Resp:  [18] 18 (04/20 1313) BP: (149)/(80) 149/80 (04/20 1313) SpO2:  [98 %-100 %] 100 % (04/21 0607) Weight change:  Last BM Date: 06/08/16  Intake/Output from previous day: 04/20 0701 - 04/21 0700 In: 600 [P.O.:600] Out: 1350 [Urine:1350] Last cbgs: CBG (last 3)   Recent Labs  06/09/16 2233 06/10/16 0231 06/10/16 0634  GLUCAP 88 129* 110*   Lab Results  Component Value Date   HGBA1C 10.3 (H) 05/22/2016     BP Readings from Last 3 Encounters:  06/09/16 (!) 149/80  05/25/16 99/69  05/04/16 (!) 186/100    Physical Exam General: No apparent distress   HEENT: not dry Lungs: Normal effort. Lungs clear to auscultation, no crackles or wheezes. Cardiovascular: Regular rate and rhythm, no edema Abdomen: S/NT/ND; BS(+) Musculoskeletal:  unchanged Neurological: No new neurological deficits with R HP Wounds: N/A    Skin: clear IV L arm Mental state: Alert, oriented, cooperative    Lab Results: BMET    Component Value Date/Time   NA 138 06/09/2016 0550   K 3.9 06/09/2016 0550   CL 106 06/09/2016 0550   CO2 24 06/09/2016 0550   GLUCOSE 134 (H) 06/09/2016 0550   BUN 16 06/09/2016 0550   CREATININE 0.71 06/09/2016 0550   CALCIUM 9.3 06/09/2016 0550   GFRNONAA >60 06/09/2016 0550   GFRAA >60 06/09/2016 0550   CBC    Component Value Date/Time   WBC 4.7 05/30/2016 0736   RBC 3.91 05/30/2016 0736   HGB 11.5  (L) 05/30/2016 0736   HCT 34.5 (L) 05/30/2016 0736   PLT 300 05/30/2016 0736   MCV 88.2 05/30/2016 0736   MCH 29.4 05/30/2016 0736   MCHC 33.3 05/30/2016 0736   RDW 13.2 05/30/2016 0736   LYMPHSABS 1.8 05/26/2016 0700   MONOABS 0.4 05/26/2016 0700   EOSABS 0.1 05/26/2016 0700   BASOSABS 0.0 05/26/2016 0700    Medications: I have reviewed the patient's current medications.  Assessment/Plan:   1. 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 2. DVT Prophylaxis/Anticoagulation: Pharmaceutical: Lovenox,    3. HTN- fair control- continue to monitor 4. T2DM- presently, nice glycemic control 5. s/p NSTEMI  Length of stay, days: 16  Rogelia Boga , MD 06/10/2016, 10:46 AM

## 2016-06-10 NOTE — Plan of Care (Signed)
Problem: RH BLADDER ELIMINATION Goal: RH STG MANAGE BLADDER WITH ASSISTANCE STG Manage Bladder With max Assistance   Outcome: Not Progressing Continues In/Out cath

## 2016-06-10 NOTE — Progress Notes (Signed)
Occupational Therapy Session Note  Patient Details  Name: Paige Stewart MRN: [id number] Date of Birth: 29-Dec-1950  Today's Date: 06/10/2016  Session 1 OT Individual Time: 0750-0900 OT Individual Time Calculation (min): 70 min   Session 2 OT Individual Time: 1435-1535 OT Individual Time Calculation (min): 60 min    Short Term Goals: Week 3:  OT Short Term Goal 1 (Week 3): LTG=STG 2/2 estimated LOS  Skilled Therapeutic Interventions/Progress Updates:    Session 1 OT treatment session focused on modified bathing/dressing, functional transfers, and improved sit<>stand. Pt had a great session with OT this morning. She completed slide board transfers bed>wc>tub bench>wc with overall Mod A, with 1 transfer closer to [place]. Pt does best transferring to her stronger R side, and responded well to verbal and tactile cues to facilitate forward weight shift to lift hips. Bathing completed with set-up and overall Mod A. She requires verbal cues for safety as she has decreased awareness of deficits and will try to stand on occasion. OT focused on UB dressing techniques today. Pt was able to thread R UE through long sleeve shirt, with verbal cues to push material past elbow. She was then able to thread L sleeve, and put head through opening with min A. Pt was able to pull shirt down over trunk with only min A to release fabric from R shoulder. OT demonstrated one-handed technique for donning sock, and pt able to bring L LE into figure 4 position and don sock with min A to support LE. OT assisted with threading pants 2/2 time management. Pt left seated in wc with needs met and call bell in reach.  Session 2 Pt greeted in wc stating need for bathroom when asked. Squat-pivot to R with Max A sit<>stand and total A >pivot to R to sit onto toilet. Total A for clothing management, then pt voided bladder and was able to complete peri-care by leaning method. Addressed w/c propulsion using L UE and LLE, pt required  guided assist to coordinate L UE and LLE to propell wc forward, often running into the wall on the L without self-correcting. Transfer training in therapy gym using slide-board. Max A transfer to R, Min-Mod A transfer to stronger L side. Facilitated weight shifting and sitting balance with lateral scooting activity. R NMR using balance stand with R UE strapped down. Incorporated cognitive activity to count 1-10 with counterclockwise, clockwise, side-side, and  Forward-back. Pt returned to bed at end of session with Mod A slideboard to  L. Pt left semi-reclined in bed with call bell and bed alarm on.    Therapy Documentation Precautions:  Precautions Precautions: Fall Precaution Comments: dense R hemiparesis Restrictions Weight Bearing Restrictions: No Pain: Pain Assessment Pain Assessment: No/denies pain ADL: ADL ADL Comments: Please see functional navigator  See Function Navigator for Current Functional Status.  Therapy/Group: Individual Therapy  Valma Cava 06/10/2016, 3:40 PM

## 2016-06-10 NOTE — Progress Notes (Signed)
Occupational Therapy Session Note  Patient Details  Name: Paige Stewart MRN: [id number] Date of Birth: 1950-06-10  Today's Date: 06/10/2016 OT Individual Time: 4098-1191 OT Individual Time Calculation (min): 30 min    Skilled Therapeutic Interventions/Progress Updates:    patient in w/c upon approach for therapy and session was focused on left upper extremity and lower extremityROM and weight bearing to help elicit function and balance; trunk stability and rotation and pelvic mobility  Patient was left seated in her w/c with safety belt and call bell in place.  This clinician reminded patient how to use call bell for help     Therapy Documentation Precautions:  Precautions Precautions: Fall Precaution Comments: dense R hemiparesis Restrictions Weight Bearing Restrictions: No Pain: Pain Assessment Pain Assessment: No/denies pain     See Function Navigator for Current Functional Status.   Therapy/Group: Individual Therapy  Bud Face [facility] 06/10/2016, 4:35 PM

## 2016-06-10 NOTE — Plan of Care (Signed)
Problem: RH SKIN INTEGRITY Goal: RH STG MAINTAIN SKIN INTEGRITY WITH ASSISTANCE STG Maintain Skin Integrity With max Assistance.   Outcome: Progressing No skin issues noted  Problem: RH PAIN MANAGEMENT Goal: RH STG PAIN MANAGED AT OR BELOW PT'S PAIN GOAL 4 or less  Outcome: Progressing Denies pain

## 2016-06-11 ENCOUNTER — Inpatient Hospital Stay (HOSPITAL_COMMUNITY): Payer: No Typology Code available for payment source

## 2016-06-11 LAB — OCCULT BLOOD X 1 CARD TO LAB, STOOL: FECAL OCCULT BLD: NEGATIVE

## 2016-06-11 LAB — URINE CULTURE

## 2016-06-11 LAB — GLUCOSE, CAPILLARY
GLUCOSE-CAPILLARY: 140 mg/dL — AB (ref 65–99)
GLUCOSE-CAPILLARY: 114 mg/dL — AB (ref 65–99)
GLUCOSE-CAPILLARY: 169 mg/dL — AB (ref 65–99)
Glucose-Capillary: 112 mg/dL — ABNORMAL HIGH (ref 65–99)

## 2016-06-11 NOTE — Progress Notes (Signed)
Patient ID: Paige Stewart, female   DOB: 07-Oct-1950, 66 y.o.   MRN: [id number]   06/11/16.  Paige Stewart is a 66 y.o. female who is status post left MCA basal ganglia infarct.  She is admitted for CIR with functional deficits related to right hemiplegia.  Subjective: No new complaints.  Doing well.  No new problems.  Remains in good spirits  Objective: Vital signs in last 24 hours: Temp:  [98 F (36.7 C)] 98 F (36.7 C) (04/22 0500) Pulse Rate:  [76] 76 (04/22 0500) Resp:  [18] 18 (04/22 0500) BP: (148)/(74) 148/74 (04/22 0500) SpO2:  [100 %] 100 % (04/22 0500) Weight change:  Last BM Date: 06/11/16  Intake/Output from previous day: 04/21 0701 - 04/22 0700 In: 325 [P.O.:325] Out: -  Last cbgs: CBG (last 3)   Recent Labs  06/10/16 1701 06/10/16 2126 06/11/16 0644  GLUCAP 74 191* 112*   BP Readings from Last 3 Encounters:  06/11/16 (!) 148/74  05/25/16 99/69  05/04/16 (!) 186/100    Physical Exam General: No apparent distress   HEENT: not dry Lungs: Normal effort. Lungs clear to auscultation, no crackles or wheezes. Cardiovascular: Regular rate and rhythm, no edema Abdomen: S/NT/ND; BS(+) Musculoskeletal:  unchanged Neurological: No new neurological deficits; dense right hemiparesis  Skin: clear  .  IV left arm Mental state: Alert, oriented, cooperative    Lab Results: BMET    Component Value Date/Time   NA 138 06/09/2016 0550   K 3.9 06/09/2016 0550   CL 106 06/09/2016 0550   CO2 24 06/09/2016 0550   GLUCOSE 134 (H) 06/09/2016 0550   BUN 16 06/09/2016 0550   CREATININE 0.71 06/09/2016 0550   CALCIUM 9.3 06/09/2016 0550   GFRNONAA >60 06/09/2016 0550   GFRAA >60 06/09/2016 0550   CBC    Component Value Date/Time   WBC 4.7 05/30/2016 0736   RBC 3.91 05/30/2016 0736   HGB 11.5 (L) 05/30/2016 0736   HCT 34.5 (L) 05/30/2016 0736   PLT 300 05/30/2016 0736   MCV 88.2 05/30/2016 0736   MCH 29.4 05/30/2016 0736   MCHC 33.3 05/30/2016 0736   RDW  13.2 05/30/2016 0736   LYMPHSABS 1.8 05/26/2016 0700   MONOABS 0.4 05/26/2016 0700   EOSABS 0.1 05/26/2016 0700   BASOSABS 0.0 05/26/2016 0700    Studies/Results: No results found.  Medications: I have reviewed the patient's current medications.  Assessment/Plan:  Status post left MCA distribution basal ganglia infarct with right hemiplegia.  Continue CIR with PT, OT and speech therapy DVT prophylaxis-continue Lovenox Hypertension.  Reasonable control.  Continue to monitor Diabetes mellitus type 2.  Well-controlled Status post NSTEMI.  Stable    Length of stay, days: 17  Rogelia Boga , MD 06/11/2016, 9:36 AM

## 2016-06-11 NOTE — Progress Notes (Signed)
Occupational Therapy Session Note  Patient Details  Name: Paige Stewart MRN: [id number] Date of Birth: [birth year]  Today's Date: 06/11/2016 OT Individual Time: 4098-1191 OT Individual Time Calculation (min): 57 min    Short Term Goals: Week 3:  OT Short Term Goal 1 (Week 3): LTG=STG 2/2 estimated LOS  Skilled Therapeutic Interventions/Progress Updates:    1:1. No compliant of pain. Focus of session on ADL retraining. Pt requests to use toilet and reports urgency. Pt sit to stand with MIN A in stedy with MOD A for balance. OT noticed R foot not on steady and caught under foot plate. OT repositioned and inspected foot with no cuts/bruises. Pt with no complaint of pain in foot. Pt voids urine on toilet with increased time. Pt able to complete hygiene with MIN A for balance from OT while seated on commode. Pt transfer back in steady to EOB with TOTAL A. Pt bathes EOB with supervision-MIN A for midline orientation/balance and overall MOD A for bathing buttocks, peri area, LUE, back and R lower leg. OT provides HOH A of RUE to incorporate in functional bathing task. Pt sit to stand EOB with Vc for sequencing, safety awareness and B knee blocking in for pt to attempt to wash peri area. Pt dons pull over shirt sitting EOB with MIN A to thread L sleeve and VC to pull sleeve past elbow. In long sitting with MIN A for trunk control and facilitation of R leg into figure 4, pt threads BLE into pants/dons B socks. Pt able to bridge L hip off bed and advance pants up L hip. Pt rolls to L for OT to advance R hip with MOD A for rolling. Pt slide board transfer with MOD A to R and Vc for sequencing/technique/hand placement through transfer EOB to w/c. Exited session with pt seated in w/c with call light in reach. Pt declines QRb at this time. Notified RN, who was entering room as OT exiting.   Therapy Documentation Precautions:  Precautions Precautions: Fall Precaution Comments: dense R  hemiparesis Restrictions Weight Bearing Restrictions: No General:   Vital Signs:  Pain: Pain Assessment Pain Assessment: No/denies pain ADL: ADL ADL Comments: Please see functional navigator  See Function Navigator for Current Functional Status.   Therapy/Group: Individual Therapy  Shon Hale 06/11/2016, 10:13 AM

## 2016-06-11 NOTE — Progress Notes (Signed)
Occupational Therapy Session Note  Patient Details  Name: Paige Stewart MRN: [id number] Date of Birth: [birth year]  Today's Date: 06/11/2016 OT Individual Time: 1400-1503 OT Individual Time Calculation (min): 63 min    Short Term Goals: Week 3:  OT Short Term Goal 1 (Week 3): LTG=STG 2/2 estimated LOS  Skilled Therapeutic Interventions/Progress Updates:    1:1 no report of pain. Focus of session on transfer training, grooming and toileting. Pt completes all slide board transfers with MOD A and Vc for sequencing, hand placement and initiation EOB>w/c<>BSC. Pt frequently tries to stand up instead of scoot across slide board. Pt grooms in sitting at sink at distance that forces pt to complete trunk flexion to reach for needed objects. Pt requires Vc to scan to R to see toothpaste. Pt propels w/c to/from therapy gym with MIN A for steering away from walls and VC to use LLE to steer chair. Pt completes squat pivot transfer with MAX A for lifting and lowering to R w/c to/from EOM. Pt maintains static sitting balance with close supervision. Before beginning visual scanning activity, pt reports urgency. In room, pt squat pivot transfer to R with MAX A w/c to [facility]. Pt requires MAX A for toileting for clothing management. Pt voids bowel and bladder with increased time and pt completes posterior hygiene. Pt returns to bed in stedy with MIN A for standing balance. Exited session with pt semi reclined in bed with call light in reach all needs met and bed exit alarm on.   Therapy Documentation Precautions:  Precautions Precautions: Fall Precaution Comments: dense R hemiparesis Restrictions Weight Bearing Restrictions: No General:   ADL: ADL ADL Comments: Please see functional navigator  See Function Navigator for Current Functional Status.   Therapy/Group: Individual Therapy  Tonny Branch 06/11/2016, 3:10 PM

## 2016-06-11 NOTE — Progress Notes (Signed)
Card sent to laboratory to check for occult blood

## 2016-06-12 ENCOUNTER — Inpatient Hospital Stay (HOSPITAL_COMMUNITY): Payer: No Typology Code available for payment source | Admitting: Occupational Therapy

## 2016-06-12 ENCOUNTER — Inpatient Hospital Stay (HOSPITAL_COMMUNITY): Payer: No Typology Code available for payment source

## 2016-06-12 ENCOUNTER — Inpatient Hospital Stay (HOSPITAL_COMMUNITY): Payer: No Typology Code available for payment source | Admitting: Speech Pathology

## 2016-06-12 LAB — URINALYSIS, ROUTINE W REFLEX MICROSCOPIC
Bilirubin Urine: NEGATIVE
Glucose, UA: NEGATIVE mg/dL
Hgb urine dipstick: NEGATIVE
Ketones, ur: NEGATIVE mg/dL
Leukocytes, UA: NEGATIVE
NITRITE: NEGATIVE
PROTEIN: NEGATIVE mg/dL
SPECIFIC GRAVITY, URINE: 1.012 (ref 1.005–1.030)
pH: 5 (ref 5.0–8.0)

## 2016-06-12 LAB — GLUCOSE, CAPILLARY
GLUCOSE-CAPILLARY: 68 mg/dL (ref 65–99)
Glucose-Capillary: 109 mg/dL — ABNORMAL HIGH (ref 65–99)
Glucose-Capillary: 102 mg/dL — ABNORMAL HIGH (ref 65–99)
Glucose-Capillary: 249 mg/dL — ABNORMAL HIGH (ref 65–99)

## 2016-06-12 NOTE — Progress Notes (Signed)
Subjective/Complaints: Pt notes bleeding from vagina, RN note this is in urine Also several loose stools noted pt without abd pain, just started ROS: Limited due cognitive   Objective: Vital Signs: Blood pressure 137/73, pulse 81, temperature 98.2 F (36.8 C), temperature source Oral, resp. rate 18, height  (1.499 m), weight 66.8 kg (147 lb 4.3 oz), SpO2 100 %. No results found. Results for orders placed or performed during the hospital encounter of 05/25/16 (from the past 72 hour(s))  Urinalysis, Routine w reflex microscopic     Status: Abnormal   Collection Time: 06/09/16  8:16 AM  Result Value Ref Range   Color, Urine YELLOW YELLOW   APPearance CLOUDY (A) CLEAR   Specific Gravity, Urine 1.014 1.005 - 1.030   pH 5.0 5.0 - 8.0   Glucose, UA NEGATIVE NEGATIVE mg/dL   Hgb urine dipstick LARGE (A) NEGATIVE   Bilirubin Urine NEGATIVE NEGATIVE   Ketones, ur NEGATIVE NEGATIVE mg/dL   Protein, ur NEGATIVE NEGATIVE mg/dL   Nitrite POSITIVE (A) NEGATIVE   Leukocytes, UA LARGE (A) NEGATIVE   RBC / HPF TOO NUMEROUS TO COUNT 0 - 5 RBC/hpf   WBC, UA TOO NUMEROUS TO COUNT 0 - 5 WBC/hpf   Bacteria, UA MANY (A) NONE SEEN   Squamous Epithelial / LPF 0-5 (A) NONE SEEN   WBC Clumps PRESENT    Mucous PRESENT   Culture, Urine     Status: Abnormal   Collection Time: 06/09/16  8:16 AM  Result Value Ref Range   Specimen Description URINE, CATHETERIZED    Special Requests NONE    Culture MULTIPLE SPECIES PRESENT, SUGGEST RECOLLECTION (A)    Report Status 06/11/2016 FINAL   Glucose, capillary     Status: Abnormal   Collection Time: 06/09/16 11:41 AM  Result Value Ref Range   Glucose-Capillary 109 (H) 65 - 99 mg/dL  Glucose, capillary     Status: Abnormal   Collection Time: 06/09/16  4:28 PM  Result Value Ref Range   Glucose-Capillary 214 (H) 65 - 99 mg/dL  Glucose, capillary     Status: None   Collection Time: 06/09/16 10:33 PM  Result Value Ref Range   Glucose-Capillary 88 65 - 99  mg/dL  Glucose, capillary     Status: Abnormal   Collection Time: 06/10/16  2:31 AM  Result Value Ref Range   Glucose-Capillary 129 (H) 65 - 99 mg/dL  Glucose, capillary     Status: Abnormal   Collection Time: 06/10/16  6:34 AM  Result Value Ref Range   Glucose-Capillary 110 (H) 65 - 99 mg/dL  Glucose, capillary     Status: Abnormal   Collection Time: 06/10/16 11:58 AM  Result Value Ref Range   Glucose-Capillary 286 (H) 65 - 99 mg/dL  Glucose, capillary     Status: Abnormal   Collection Time: 06/10/16  4:48 PM  Result Value Ref Range   Glucose-Capillary 54 (L) 65 - 99 mg/dL   Comment 1 Repeat Test   Glucose, capillary     Status: None   Collection Time: 06/10/16  5:01 PM  Result Value Ref Range   Glucose-Capillary 74 65 - 99 mg/dL  Glucose, capillary     Status: Abnormal   Collection Time: 06/10/16  9:26 PM  Result Value Ref Range   Glucose-Capillary 191 (H) 65 - 99 mg/dL  Occult blood card to lab, stool RN will collect     Status: None   Collection Time: 06/11/16  1:13 AM  Result Value  Ref Range   Fecal Occult Bld NEGATIVE NEGATIVE  Glucose, capillary     Status: Abnormal   Collection Time: 06/11/16  6:44 AM  Result Value Ref Range   Glucose-Capillary 112 (H) 65 - 99 mg/dL  Glucose, capillary     Status: Abnormal   Collection Time: 06/11/16 12:31 PM  Result Value Ref Range   Glucose-Capillary 140 (H) 65 - 99 mg/dL  Glucose, capillary     Status: Abnormal   Collection Time: 06/11/16  5:18 PM  Result Value Ref Range   Glucose-Capillary 114 (H) 65 - 99 mg/dL  Glucose, capillary     Status: Abnormal   Collection Time: 06/11/16  9:23 PM  Result Value Ref Range   Glucose-Capillary 169 (H) 65 - 99 mg/dL  Glucose, capillary     Status: Abnormal   Collection Time: 06/12/16  6:44 AM  Result Value Ref Range   Glucose-Capillary 109 (H) 65 - 99 mg/dL     HEENT: Normocephalic, atraumatic Cardio:RRR. Resp: CTA bilaterally. Normal effort GI: BS positive and ND Skin:   Intact.  Warm and dry. Neuro: Flat, word finding deficits.  Motor 0 /5 in RUE and 2- hip/knee ext synergy RLE Musc/Skel:  No edema. No tenderness.no pain with ROM RIght wrist , no effusion or erythema, mild medial R ankle pain with palp no pain with ROM, no effusion  Gen NAD. Vital signs reviewed.   Assessment/Plan: 1. Functional deficits secondary to Basal ganglia infarct which require 3+ hours per day of interdisciplinary therapy in a comprehensive inpatient rehab setting. Physiatrist is providing close team supervision and 24 hour management of active medical problems listed below. Physiatrist and rehab team continue to assess barriers to discharge/monitor patient progress toward functional and medical goals. FIM: Function - Bathing Bathing activity did not occur: N/A Position: Bed Body parts bathed by patient: Right arm, Chest, Abdomen, Front perineal area, Right upper leg, Left upper leg Body parts bathed by helper: Back, Right lower leg, Left upper leg, Right upper leg, Buttocks, Front perineal area Assist Level: 2 helpers  Function- Upper Body Dressing/Undressing Upper body dressing/undressing activity did not occur: N/A What is the patient wearing?: Pull over shirt/dress Pull over shirt/dress - Perfomed by patient: Thread/unthread right sleeve, Thread/unthread left sleeve, Put head through opening, Pull shirt over trunk Pull over shirt/dress - Perfomed by helper: Thread/unthread right sleeve, Thread/unthread left sleeve, Put head through opening, Pull shirt over trunk Button up shirt - Perfomed by patient: Thread/unthread right sleeve Button up shirt - Perfomed by helper: Button/unbutton shirt Assist Level: 2 helpers Function - Lower Body Dressing/Undressing Lower body dressing/undressing activity did not occur: N/A What is the patient wearing?: Underwear Position: Bed Underwear - Performed by helper: Pull underwear up/down Pants- Performed by patient: Thread/unthread left pants  leg Pants- Performed by helper: Thread/unthread right pants leg, Pull pants up/down Non-skid slipper socks- Performed by patient: Don/doff right sock, Don/doff left sock Non-skid slipper socks- Performed by helper: Don/doff left sock Socks - Performed by patient: Don/doff left sock Socks - Performed by helper: Don/doff left sock Assist for footwear: Supervision/touching assist Assist for lower body dressing:  (MAX A)  Function - Toileting Toileting activity did not occur: No continent bowel/bladder event Toileting steps completed by patient: Adjust clothing prior to toileting Toileting steps completed by helper: Adjust clothing prior to toileting, Performs perineal hygiene, Adjust clothing after toileting Toileting Assistive Devices: Grab bar or rail Assist level:  (total assist)  Function - Archivist transfer activity did not  occur:  (not attempted) Toilet transfer assistive device: Elevated toilet seat/BSC over toilet, Mechanical lift Mechanical lift: Stedy Assist level to toilet: Touching or steadying assistance (Pt > 75%) Assist level from toilet: Touching or steadying assistance (Pt > 75%)  Function - Chair/bed transfer Chair/bed transfer method: Lateral scoot Chair/bed transfer assist level: Moderate assist (Pt 50 - 74%/lift or lower) Chair/bed transfer assistive device: Sliding board Chair/bed transfer details: Tactile cues for weight shifting, Tactile cues for placement, Manual facilitation for weight shifting, Visual cues for safe use of DME/AE  Function - Locomotion: Wheelchair Will patient use wheelchair at discharge?: Yes Type: Manual Max wheelchair distance: 60 Assist Level: Moderate assistance (Pt 50 - 74%) Assist Level: Moderate assistance (Pt 50 - 74%) Assist Level: Dependent (Pt equals 0%) Turns around,maneuvers to table,bed, and toilet,negotiates 3% grade,maneuvers on rugs and over doorsills: No Function - Locomotion: Ambulation Assistive device:  Rail in hallway Max distance: 10 Assist level: 2 helpers (total for gait, +2 for w/c follow) Assist level: 2 helpers Walk 50 feet with 2 turns activity did not occur: Safety/medical concerns Walk 150 feet activity did not occur: Safety/medical concerns Walk 10 feet on uneven surfaces activity did not occur: Safety/medical concerns  Function - Comprehension Comprehension: Auditory Comprehension assist level: Understands basic 50 - 74% of the time/ requires cueing 25 - 49% of the time  Function - Expression Expression: Verbal Expression assist level: Expresses basis less than 25% of the time/requires cueing >75% of the time.  Function - Social Interaction Social Interaction assist level: Interacts appropriately 50 - 74% of the time - May be physically or verbally inappropriate.  Function - Problem Solving Problem solving assist level: Solves basic 25 - 49% of the time - needs direction more than half the time to initiate, plan or complete simple activities  Function - Memory Memory assist level: Recognizes or recalls less than 25% of the time/requires cueing greater than 75% of the time Patient normally able to recall (first 3 days only): That he or she is in a hospital  Medical Problem List and Plan: 1.  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 2.  DVT Prophylaxis/Anticoagulation: Pharmaceutical: Lovenox,    -Vascular study with small right baker's cyst, otherwise unremarkable 3. Pain Management: Added Sportscreme for back pain.  4. Mood: LCSW to follow for evaluation and support  -paxil trial for depression.  5. Neuropsych: This patient is not capable of making decisions on her own behalf. Ask neuropsych to eval, poor initiation difficulty following PT instructions, ? Apraxia , post stroke depression 6. Skin/Wound Care: pressure relief measures. 7. Fluids/Electrolytes/Nutrition: Monitor I/O.  8. HTN: monitor BP  bid--continue HCTZ.   Variable systolic or diastolic HTN ,off hctz  toprol dose to  Vitals:   06/11/16 1700 06/12/16 0424  BP: (!) 114/59 137/73  Pulse: 82 81  Resp: 18 18  Temp: 98.5 F (36.9 C) 98.2 F (36.8 C)    9. Hypokalemia: resolved on  supplement.  K+ 4.2 on 4/16  11. NSTEMI: Will need follow up NUC study in the future.  12. T2DM with peripheral neuropathy: Monitor BS ac/hs. Hgb A1c- 10.3- .       -increase amaryl to ,Increase  metformin  qam started 4/19 CBG improving 4/23, diarrhea may be related to increase metformin with d/c (last 3)   Recent Labs  06/11/16 1718 06/11/16 2123 06/12/16 0644  GLUCAP 114* 169* 109*   13.  ARF- prerenal azotemia  cont .45 NS qhs, EF 45% no signs overload, weights stable around 63kg  -follow up labs normal, poor fluid intake  IVF at noc go back to 92ml/hr , recorded po fluids only 4/18 , will d/c and enc fluids given upcoming d/c 14.  Hematuria- now on Bactrim, no reason to do micro again at this time 15.  Loose stool may be metformin related, also d/c fibercon  LOS (Days) 18 A FACE TO FACE EVALUATION WAS PERFORMED  KIRSTEINS,ANDREW E 06/12/2016, 8:02 AM

## 2016-06-12 NOTE — Progress Notes (Signed)
Speech Language Pathology Daily Session Note  Patient Details  Name: Paige Stewart MRN: [id number] Date of Birth: September 04, 1950  Today's Date: 06/12/2016 SLP Individual Time: 1133-1200 SLP Individual Time Calculation (min): 27 min  Short Term Goals: Week 3: SLP Short Term Goal 1 (Week 3): Patient will consume Dys.2 textures and thin with minimal overt s/s of aspiration with Min A verbal cues needed for use of swallowing compensatory strategies.  SLP Short Term Goal 2 (Week 3): Patient with demonstrate effective mastication and oral clearance of Dys.3 textures with Min A for 2 consecutive sessions to demonstrate readiness for upgrade.  SLP Short Term Goal 3 (Week 3): Patient will utilize an increased vocal intensity at the word and phrase level to achieve ~75% intelligibility with Min A verbal cues.  SLP Short Term Goal 4 (Week 3): Patient will self-monitor and correct verbal errors at the phrase level with Mod A multimodal cues.  SLP Short Term Goal 5 (Week 3): Patient will attend to right upper extremity during functional tasks with Min A multimodal cues. SLP Short Term Goal 6 (Week 3): Patient will demonstrate functional problem solving for basic and familiar tasks with Min A verbal cues.   Skilled Therapeutic Interventions: Skilled treatment session focused on speech goals. Patient was ~90% intelligible at the sentence level with Min A verbal cues needed for use of an increased vocal intensity and over-articulation. Patient also participated in a verbal description task at the phrase and sentence level with supervision verbal cues required for word-finding and self-monitoring and correcting errors. Patient left upright in bed with all needs within reach. Continue with current plan of care.      Function:  Eating Eating   Modified Consistency Diet: Yes Eating Assist Level: Set up assist for;Supervision or verbal cues   Eating Set Up Assist For: Opening containers Helper Scoops Food on  Utensil: Occasionally     Cognition Comprehension Comprehension assist level: Understands basic 50 - 74% of the time/ requires cueing 25 - 49% of the time  Expression   Expression assist level: Expresses basis less than 25% of the time/requires cueing >75% of the time.  Social Interaction Social Interaction assist level: Interacts appropriately 50 - 74% of the time - May be physically or verbally inappropriate.  Problem Solving Problem solving assist level: Solves basic 25 - 49% of the time - needs direction more than half the time to initiate, plan or complete simple activities  Memory Memory assist level: Recognizes or recalls less than 25% of the time/requires cueing greater than 75% of the time    Pain Pain Assessment Pain Assessment: No/denies pain  Therapy/Group: Individual Therapy  Aquarius Latouche 06/12/2016, 3:13 PM

## 2016-06-12 NOTE — Progress Notes (Signed)
Occupational Therapy Session Note  Patient Details  Name: Paige Stewart MRN: [id number] Date of Birth: 1951-02-04  Today's Date: 06/12/2016 OT Individual Time: 4098-1191 OT Individual Time Calculation (min): 41 min    Short Term Goals: Week 3:  OT Short Term Goal 1 (Week 3): LTG=STG 2/2 estimated LOS  Skilled Therapeutic Interventions/Progress Updates:    Pt received in bed completing breakfast with NT.  Pt continued eating for 10 min at start of session with cues to take smaller bites and ensure she has fully swallowed each bite as food was collecting in her mouth. Bathing and dressing LB from bed level with max A, sat to EOB with only min A and demonstrated improved balance sitting EOB statically with close S for 5 minutes +.  Used slide board from bed to w/c to her R side with mod A. Pt was able to scoot and did not try to stand up.  Once in chair, she worked on [employer] and dressing with mod A.  Completed oral care at sink with set up and extra time.  Pt needed mod-max cues to fully attend to her R side during self care.  Pt resting in w/c with pillow supporting R arm as lap tray was not fitting on chair. Quick release belt on and call light in reach.   Therapy Documentation Precautions:  Precautions Precautions: Fall Precaution Comments: dense R hemiparesis Restrictions Weight Bearing Restrictions: No   Pain: Pain Assessment Pain Assessment: No/denies pain ADL: ADL ADL Comments: Please see functional navigator See Function Navigator for Current Functional Status.   Therapy/Group: Individual Therapy  Mikesha Migliaccio 06/12/2016, 12:01 PM

## 2016-06-12 NOTE — Progress Notes (Signed)
Occupational Therapy Session Note  Patient Details  Name: Paige Stewart MRN: [id number] Date of Birth: 02-04-1951  Today's Date: 06/12/2016 OT Individual Time: 1045-1130 OT Individual Time Calculation (min): 45 min  and Today's Date: 06/12/2016   Skilled Therapeutic Interventions/Progress Updates: patient participated in neuro re education EOB via pelvic tilts and motility;  seated   lateral weight bearing though L & R lower & upper extremities;  Trunk and core strength and stability (seated EOB);     Patient c/o fatigue and asked to ly down and stop session due to fatigue.   This clinician will offer again later in the day.   She completed bed mobility with moderate assistance.   She was hemipositioned onto her left side with call bell and phone in place and will bed alarm engaged.     Therapy Documentation Precautions:  Precautions Precautions: Fall Precaution Comments: dense R hemiparesis Restrictions Weight Bearing Restrictions: No General: General OT Amount of Missed Time: 15 Minutes (15) but will offer more therapy later today    Pain: Pain Assessment Pain Assessment: No/denies pain    See Function Navigator for Current Functional Status.   Therapy/Group: Individual Therapy  Bud Face [facility] 06/12/2016, 12:21 PM

## 2016-06-12 NOTE — Progress Notes (Signed)
Physical Therapy Session Note  Patient Details  Name: Paige Stewart MRN: [id number] Date of Birth: April 02, 1950  Today's Date: 06/12/2016 PT Individual Time: 1345-1442 PT Individual Time Calculation (min): 57 min   Short Term Goals: Week 3:  PT Short Term Goal 1 (Week 3): =LTGs due to ELOS  Skilled Therapeutic Interventions/Progress Updates:    Session focused on neuro re-ed to address postural control, forced use of RLE/RUE, weightshifting and reciprocal movement pattern re-training during mobility, transfers, Nustep, and gait. Pt required mod to max assist for squat/stand pivot transfers with cues and facilitation for weightshifting and hand placement. Decreased perceptual awareness of R hemibody in space. Attempted one slideboard transfer but pt difficulty understanding need to place board under bottom before attempting to stand despite multiple attempts and various forms of education. Nustep x 7 min on level 6 with PT assisting RLE to maintain neutral and increase to full ROM with cues for attention during task. Gait with railing in hallway with max assist for facilitation of weightshift and total assist for advancement of RLE x 10' with w/c follow for safety. Pt declined further due to fatigue. End of session returned back to bed with focus on bed mobility and body positioning/awareness.   Therapy Documentation Precautions:  Precautions Precautions: Fall Precaution Comments: dense R hemiparesis Restrictions Weight Bearing Restrictions: No  Pain: Pain Assessment Pain Assessment: No/denies pain   See Function Navigator for Current Functional Status.   Therapy/Group: Individual Therapy  Karolee Stamps Darrol Poke, PT, DPT  06/12/2016, 3:49 PM

## 2016-06-13 ENCOUNTER — Inpatient Hospital Stay (HOSPITAL_COMMUNITY): Payer: No Typology Code available for payment source | Admitting: Speech Pathology

## 2016-06-13 ENCOUNTER — Inpatient Hospital Stay (HOSPITAL_COMMUNITY): Payer: No Typology Code available for payment source | Admitting: Occupational Therapy

## 2016-06-13 ENCOUNTER — Inpatient Hospital Stay (HOSPITAL_COMMUNITY): Payer: No Typology Code available for payment source | Admitting: Physical Therapy

## 2016-06-13 LAB — CBC WITH DIFFERENTIAL/PLATELET
BASOS PCT: 0 %
Basophils Absolute: 0 10*3/uL (ref 0.0–0.1)
EOS ABS: 0.1 10*3/uL (ref 0.0–0.7)
EOS PCT: 3 %
HCT: 33.6 % — ABNORMAL LOW (ref 36.0–46.0)
Hemoglobin: 11.5 g/dL — ABNORMAL LOW (ref 12.0–15.0)
LYMPHS ABS: 1.4 10*3/uL (ref 0.7–4.0)
Lymphocytes Relative: 40 %
MCH: 29.7 pg (ref 26.0–34.0)
MCHC: 34.2 g/dL (ref 30.0–36.0)
MCV: 86.8 fL (ref 78.0–100.0)
Monocytes Absolute: 0.3 10*3/uL (ref 0.1–1.0)
Monocytes Relative: 9 %
Neutro Abs: 1.7 10*3/uL (ref 1.7–7.7)
Neutrophils Relative %: 48 %
PLATELETS: 234 10*3/uL (ref 150–400)
RBC: 3.87 MIL/uL (ref 3.87–5.11)
RDW: 13.1 % (ref 11.5–15.5)
WBC: 3.6 10*3/uL — AB (ref 4.0–10.5)

## 2016-06-13 LAB — BASIC METABOLIC PANEL
Anion gap: 8 (ref 5–15)
BUN: 17 mg/dL (ref 6–20)
CALCIUM: 9 mg/dL (ref 8.9–10.3)
CO2: 24 mmol/L (ref 22–32)
CREATININE: 0.86 mg/dL (ref 0.44–1.00)
Chloride: 106 mmol/L (ref 101–111)
GFR calc Af Amer: >60 mL/min (ref >60)
Glucose, Bld: 166 mg/dL — ABNORMAL HIGH (ref 65–99)
Potassium: 4.2 mmol/L (ref 3.5–5.1)
Sodium: 138 mmol/L (ref 135–145)

## 2016-06-13 LAB — GLUCOSE, CAPILLARY
GLUCOSE-CAPILLARY: 162 mg/dL — AB (ref 65–99)
GLUCOSE-CAPILLARY: 259 mg/dL — AB (ref 65–99)
Glucose-Capillary: 132 mg/dL — ABNORMAL HIGH (ref 65–99)
Glucose-Capillary: 118 mg/dL — ABNORMAL HIGH (ref 65–99)

## 2016-06-13 NOTE — Progress Notes (Signed)
Physical Therapy Session Note  Patient Details  Name: Paige Stewart MRN: [id number] Date of Birth: 1950-09-13  Today's Date: 06/13/2016 PT Individual Time: 0900-0958 PT Individual Time Calculation (min): 58 min   Short Term Goals: Week 3:  PT Short Term Goal 1 (Week 3): =LTGs due to ELOS  Skilled Therapeutic Interventions/Progress Updates:    no c/o pain, session focus on static/dynamic sitting balance and transfers during self care tasks, transfers with slide board, and w/c propulsion.    Pt transitions supine>sitting towards L side with HOB elevated and bedrails with min assist to manage RLE.  Stand/pivot to pt's R with max assist for sit<>stand and for positioning/blocking of RLE during pivot.  Pt positioned at sink and able to complete oral care and wash face with set up assist.  UB dressing with assist to thread RUE and pull shirt over trunk, LB dressing with assist to thread BLEs and pull pants over hips with mod assist for sit<>stand.    In therapy gym, pt performs blocked practice for R/L transfers with slide board on slightly uneven surface with overall steady assist and max fade to min multimodal cues for weight shifting, head/hips relationship, and hand placement.  W/C propulsion back to room at end of session with therapist providing only tactile cue at pt's L knee as reminder for pt to use LLE with L hemi-technique for w/c propulsion.  Min assist overall for w/c propulsion.  Pt adamantly refusing QRB at end of session.  Left in care of RN who was present to administer medications.    Therapy Documentation Precautions:  Precautions Precautions: Fall Precaution Comments: dense R hemiparesis Restrictions Weight Bearing Restrictions: No   See Function Navigator for Current Functional Status.   Therapy/Group: Individual Therapy  Paige Stewart 06/13/2016, 10:02 AM

## 2016-06-13 NOTE — Progress Notes (Signed)
Subjective/Complaints: Required cath last noc, was not toileting , on bedpan ROS: Limited due cognitive   Objective: Vital Signs: Blood pressure 132/75, pulse 79, temperature 98.2 F (36.8 C), temperature source Oral, resp. rate 18, height _0  (1.499 m), weight 66.8 kg (147 lb 4.3 oz), SpO2 100 %. No results found. Results for orders placed or performed during the hospital encounter of 05/25/16 (from the past 72 hour(s))  Glucose, capillary     Status: Abnormal   Collection Time: 06/10/16 11:58 AM  Result Value Ref Range   Glucose-Capillary 286 (H) 65 - 99 mg/dL  Glucose, capillary     Status: Abnormal   Collection Time: 06/10/16  4:48 PM  Result Value Ref Range   Glucose-Capillary 54 (L) 65 - 99 mg/dL   Comment 1 Repeat Test   Glucose, capillary     Status: None   Collection Time: 06/10/16  5:01 PM  Result Value Ref Range   Glucose-Capillary 74 65 - 99 mg/dL  Glucose, capillary     Status: Abnormal   Collection Time: 06/10/16  9:26 PM  Result Value Ref Range   Glucose-Capillary 191 (H) 65 - 99 mg/dL  Occult blood card to lab, stool RN will collect     Status: None   Collection Time: 06/11/16  1:13 AM  Result Value Ref Range   Fecal Occult Bld NEGATIVE NEGATIVE  Glucose, capillary     Status: Abnormal   Collection Time: 06/11/16  6:44 AM  Result Value Ref Range   Glucose-Capillary 112 (H) 65 - 99 mg/dL  Glucose, capillary     Status: Abnormal   Collection Time: 06/11/16 12:31 PM  Result Value Ref Range   Glucose-Capillary 140 (H) 65 - 99 mg/dL  Glucose, capillary     Status: Abnormal   Collection Time: 06/11/16  5:18 PM  Result Value Ref Range   Glucose-Capillary 114 (H) 65 - 99 mg/dL  Glucose, capillary     Status: Abnormal   Collection Time: 06/11/16  9:23 PM  Result Value Ref Range   Glucose-Capillary 169 (H) 65 - 99 mg/dL  Glucose, capillary     Status: Abnormal   Collection Time: 06/12/16  6:44 AM  Result Value Ref Range   Glucose-Capillary 109 (H) 65 -  99 mg/dL  Glucose, capillary     Status: Abnormal   Collection Time: 06/12/16 12:27 PM  Result Value Ref Range   Glucose-Capillary 102 (H) 65 - 99 mg/dL  Urinalysis, Routine w reflex microscopic     Status: None   Collection Time: 06/12/16  3:35 PM  Result Value Ref Range   Color, Urine YELLOW YELLOW   APPearance CLEAR CLEAR   Specific Gravity, Urine 1.012 1.005 - 1.030   pH 5.0 5.0 - 8.0   Glucose, UA NEGATIVE NEGATIVE mg/dL   Hgb urine dipstick NEGATIVE NEGATIVE   Bilirubin Urine NEGATIVE NEGATIVE   Ketones, ur NEGATIVE NEGATIVE mg/dL   Protein, ur NEGATIVE NEGATIVE mg/dL   Nitrite NEGATIVE NEGATIVE   Leukocytes, UA NEGATIVE NEGATIVE  Glucose, capillary     Status: None   Collection Time: 06/12/16  5:07 PM  Result Value Ref Range   Glucose-Capillary 68 65 - 99 mg/dL  Glucose, capillary     Status: Abnormal   Collection Time: 06/12/16  9:11 PM  Result Value Ref Range   Glucose-Capillary 249 (H) 65 - 99 mg/dL  Glucose, capillary     Status: Abnormal   Collection Time: 06/13/16  6:48 AM  Result Value  Ref Range   Glucose-Capillary 162 (H) 65 - 99 mg/dL  Basic metabolic panel     Status: Abnormal   Collection Time: 06/13/16  6:50 AM  Result Value Ref Range   Sodium 138 135 - 145 mmol/L   Potassium 4.2 3.5 - 5.1 mmol/L   Chloride 106 101 - 111 mmol/L   CO2 24 22 - 32 mmol/L   Glucose, Bld 166 (H) 65 - 99 mg/dL   BUN 17 6 - 20 mg/dL   Creatinine, Ser 0.86 0.44 - 1.00 mg/dL   Calcium 9.0 8.9 - 10.3 mg/dL   GFR calc non Af Amer >60 >60 mL/min   GFR calc Af Amer >60 >60 mL/min    Comment: (NOTE) The eGFR has been calculated using the CKD EPI equation. This calculation has not been validated in all clinical situations. eGFR's persistently <60 mL/min signify possible Chronic Kidney Disease.    Anion gap 8 5 - 15  CBC with Differential/Platelet     Status: Abnormal   Collection Time: 06/13/16  6:50 AM  Result Value Ref Range   WBC 3.6 (L) 4.0 - 10.5 K/uL   RBC 3.87 3.87 -  5.11 MIL/uL   Hemoglobin 11.5 (L) 12.0 - 15.0 g/dL   HCT 33.6 (L) 36.0 - 46.0 %   MCV 86.8 78.0 - 100.0 fL   MCH 29.7 26.0 - 34.0 pg   MCHC 34.2 30.0 - 36.0 g/dL   RDW 13.1 11.5 - 15.5 %   Platelets 234 150 - 400 K/uL   Neutrophils Relative % 48 %   Neutro Abs 1.7 1.7 - 7.7 K/uL   Lymphocytes Relative 40 %   Lymphs Abs 1.4 0.7 - 4.0 K/uL   Monocytes Relative 9 %   Monocytes Absolute 0.3 0.1 - 1.0 K/uL   Eosinophils Relative 3 %   Eosinophils Absolute 0.1 0.0 - 0.7 K/uL   Basophils Relative 0 %   Basophils Absolute 0.0 0.0 - 0.1 K/uL     HEENT: Normocephalic, atraumatic Cardio:RRR. Resp: CTA bilaterally. Normal effort GI: BS positive and ND Skin:   Intact. Warm and dry. Neuro: Flat, word finding deficits.  Motor 0 /5 in RUE and 2- hip/knee ext synergy RLE Musc/Skel:  No edema. No tenderness.no pain with ROM RIght wrist , no effusion or erythema, mild medial R ankle pain with palp no pain with ROM, no effusion  Gen NAD. Vital signs reviewed.   Assessment/Plan: 1. Functional deficits secondary to Basal ganglia infarct which require 3+ hours per day of interdisciplinary therapy in a comprehensive inpatient rehab setting. Physiatrist is providing close team supervision and 24 hour management of active medical problems listed below. Physiatrist and rehab team continue to assess barriers to discharge/monitor patient progress toward functional and medical goals. FIM: Function - Bathing Bathing activity did not occur: N/A Position: Bed Body parts bathed by patient: Right arm, Chest, Abdomen, Front perineal area, Right upper leg, Left upper leg Body parts bathed by helper: Front perineal area, Buttocks, Right upper leg, Left upper leg Assist Level: 2 helpers  Function- Upper Body Dressing/Undressing Upper body dressing/undressing activity did not occur: N/A What is the patient wearing?: Pull over shirt/dress Pull over shirt/dress - Perfomed by patient: Thread/unthread right sleeve,  Thread/unthread left sleeve, Put head through opening, Pull shirt over trunk Pull over shirt/dress - Perfomed by helper: Thread/unthread right sleeve, Thread/unthread left sleeve, Put head through opening, Pull shirt over trunk Button up shirt - Perfomed by patient: Thread/unthread right sleeve Button up shirt -  Perfomed by helper: Button/unbutton shirt Assist Level: 2 helpers Function - Lower Body Dressing/Undressing Lower body dressing/undressing activity did not occur: N/A What is the patient wearing?: Underwear Position: Bed Underwear - Performed by helper: Pull underwear up/down Pants- Performed by patient: Thread/unthread left pants leg Pants- Performed by helper: Thread/unthread right pants leg, Pull pants up/down Non-skid slipper socks- Performed by patient: Don/doff right sock, Don/doff left sock Non-skid slipper socks- Performed by helper: Don/doff left sock Socks - Performed by patient: Don/doff left sock Socks - Performed by helper: Don/doff left sock Assist for footwear: Supervision/touching assist Assist for lower body dressing:  (MAX A)  Function - Toileting Toileting activity did not occur: No continent bowel/bladder event Toileting steps completed by patient: Adjust clothing prior to toileting Toileting steps completed by helper: Adjust clothing prior to toileting, Performs perineal hygiene, Adjust clothing after toileting Toileting Assistive Devices: Grab bar or rail Assist level:  (total assist)  Function - Air cabin crew transfer activity did not occur:  (not attempted) Toilet transfer assistive device: Elevated toilet seat/BSC over toilet, Mechanical lift Mechanical lift: Stedy Assist level to toilet: Touching or steadying assistance (Pt > 75%) Assist level from toilet: Touching or steadying assistance (Pt > 75%)  Function - Chair/bed transfer Chair/bed transfer method: Stand pivot Chair/bed transfer assist level: Maximal assist (Pt 25 - 49%/lift and  lower) Chair/bed transfer assistive device: Armrests Chair/bed transfer details: Tactile cues for weight shifting, Tactile cues for placement, Manual facilitation for weight shifting, Visual cues for safe use of DME/AE  Function - Locomotion: Wheelchair Will patient use wheelchair at discharge?: Yes Type: Manual Max wheelchair distance: 19' Assist Level: Moderate assistance (Pt 50 - 74%) Assist Level: Maximal assistance (Pt 25 - 49%) Assist Level: Dependent (Pt equals 0%) Turns around,maneuvers to table,bed, and toilet,negotiates 3% grade,maneuvers on rugs and over doorsills: No Function - Locomotion: Ambulation Assistive device: Rail in hallway Max distance: 10 Assist level: 2 helpers (max A with +2 for w/c follow) Assist level: 2 helpers Walk 50 feet with 2 turns activity did not occur: Safety/medical concerns Walk 150 feet activity did not occur: Safety/medical concerns Walk 10 feet on uneven surfaces activity did not occur: Safety/medical concerns  Function - Comprehension Comprehension: Auditory Comprehension assist level: Understands basic 50 - 74% of the time/ requires cueing 25 - 49% of the time  Function - Expression Expression: Verbal Expression assist level: Expresses basis less than 25% of the time/requires cueing >75% of the time.  Function - Social Interaction Social Interaction assist level: Interacts appropriately 50 - 74% of the time - May be physically or verbally inappropriate.  Function - Problem Solving Problem solving assist level: Solves basic 25 - 49% of the time - needs direction more than half the time to initiate, plan or complete simple activities  Function - Memory Memory assist level: Recognizes or recalls less than 25% of the time/requires cueing greater than 75% of the time Patient normally able to recall (first 3 days only): That he or she is in a hospital  Medical Problem List and Plan: 1.  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  DVT Prophylaxis/Anticoagulation: Pharmaceutical: Lovenox,    -Vascular study with small right baker's cyst, otherwise unremarkable 3. Pain Management: Added Sportscreme for back pain.  4. Mood: LCSW to follow for evaluation and support  -paxil trial for depression.  5. Neuropsych: This patient is not capable of making decisions on  her own behalf. Ask neuropsych to eval, poor initiation difficulty following PT instructions, ? Apraxia , post stroke depression 6. Skin/Wound Care: pressure relief measures. 7. Fluids/Electrolytes/Nutrition: Monitor I/O.  8. HTN: monitor BP bid--continue HCTZ.   Variable systolic or diastolic HTN ,off hctz  toprol dose to 17m Vitals:   06/13/16 0454 06/13/16 0509  BP: (!) 148/79 132/75  Pulse: 77 79  Resp: 18 18  Temp: 98.3 F (36.8 C) 98.2 F (36.8 C)    9. Hypokalemia: resolved on  supplement.  K+ 4.2 on 4/16  11. NSTEMI: Will need follow up NUC study in the future.  12. T2DM with peripheral neuropathy: Monitor BS ac/hs. Hgb A1c- 10.3- .       -increase amaryl to 392mIncrease  metformin 100036mam started 4/19 CBG improving 4/23, diarrhea may be related to increase metformin with d/c (last 3)   Recent Labs  06/12/16 1707 06/12/16 2111 06/13/16 0648  GLUCAP 68 249* 162*   13.  ARF- prerenal azotemia  cont .45 NS qhs, EF 45% no signs overload, weights stable around 63kg  -follow up labs normal, poor fluid intake  IVF at noc go back to 87m73m , recorded po fluids only 360ml73m8 , will d/c and enc fluids given upcoming d/c 14.  Hematuria- now on Bactrim, no reason to do micro again at this time, complete 5 d course tonite 15.  Loose stool resolved off metformin and  fibercon  LOS (Days) 19 A FACE TO FACE EVALUATION WAS PERFORMED  Jema Deegan E 06/13/2016, 8:11 AM

## 2016-06-13 NOTE — Progress Notes (Signed)
Social Work Patient ID: Paige Stewart, female   DOB: 1950/04/13, 66 y.o.   MRN: [id number]  Spoke with daughter via telephone to schedule family education, daughter to be here tomorrow at 9:00 to begin this. Informed daughter pt will require 24 hr physical care and she is not eligible for services here due to only here on a travel visa. Can get home health for follow up. Have discussed with daughter if they want to Private pay in a NH. See daughter tomorrow to discuss care and equipment needs. Unsure if daughter is realistic about her Mother's care. See tomorrow.

## 2016-06-13 NOTE — Progress Notes (Signed)
Occupational Therapy Session Note  Patient Details  Name: Paige Stewart MRN: [id number] Date of Birth: [birth year]  Today's Date: 06/13/2016 OT Individual Time: 1331-1445 OT Individual Time Calculation (min): 74 min   Short Term Goals: Week 3:  OT Short Term Goal 1 (Week 3): LTG=STG 2/2 estimated LOS  Skilled Therapeutic Interventions/Progress Updates:    OT treatment session focused on transfer training, sitting balance, and improved sit<>stand. Pt completed bed mobility with min A to move R LE and  Min A to scoot R hip to edge of bed. Practiced slideboard transfer to wide drop arm BSC.Overall min A with tactile cues to facilitate weight shift to R w/ 1 lateral LOB to R requiring mod A to correct. Lateral transfer to wc on L without slide board wiith min guard A- much improved anterior weight shift with transfers today. Pt brought to tub room and practiced tub shower transfer using slideboard, dysom for non-skid, and tub transfer bench. Pt required Mod A for uphill scoot to R with assistance to swing R LE into tub, but pt able to manager her L. Discussed home bathroom modifications with pt and will need to continue education with pt's daughter. Continued to work on [employer] transfer training in therapy gym with focus on weight shifting at hips to facilitate reciprocal scoot. OT then provided pt with hemi walker to facilitate anterior weight shift with sit<>stand. Overall Mod A sit<>stand with inconsistent min A. Pt able to maintain standing balance with mod overall and intermittent min guard- posterior and lateral LOB to R. Hemi-walker may be helpful with sit<>stand to reduce caregiver burden. Pt returned to room at end of session and completed SB transfer to R with Mod A slightly uphill. Pt left with bed alarm on and call bell in reach.    Therapy Documentation Precautions:  Precautions Precautions: Fall Precaution Comments: dense R hemiparesis Restrictions Weight Bearing Restrictions: No Pain:  denies pain ADL: ADL ADL Comments: Please see functional navigator  See Function Navigator for Current Functional Status.   Therapy/Group: Individual Therapy  Mal Amabile 06/13/2016, 1:54 PM

## 2016-06-13 NOTE — Progress Notes (Signed)
Speech Language Pathology Daily Session Note  Patient Details  Name: Paige Stewart MRN: [id number] Date of Birth: [birth year]  Today's Date: 06/13/2016 SLP Individual Time: 4098-1191 SLP Individual Time Calculation (min): 40 min  Short Term Goals: Week 3: SLP Short Term Goal 1 (Week 3): Patient will consume Dys.2 textures and thin with minimal overt s/s of aspiration with Min A verbal cues needed for use of swallowing compensatory strategies.  SLP Short Term Goal 2 (Week 3): Patient with demonstrate effective mastication and oral clearance of Dys.3 textures with Min A for 2 consecutive sessions to demonstrate readiness for upgrade.  SLP Short Term Goal 3 (Week 3): Patient will utilize an increased vocal intensity at the word and phrase level to achieve ~75% intelligibility with Min A verbal cues.  SLP Short Term Goal 4 (Week 3): Patient will self-monitor and correct verbal errors at the phrase level with Mod A multimodal cues.  SLP Short Term Goal 5 (Week 3): Patient will attend to right upper extremity during functional tasks with Min A multimodal cues. SLP Short Term Goal 6 (Week 3): Patient will demonstrate functional problem solving for basic and familiar tasks with Min A verbal cues.   Skilled Therapeutic Interventions: Skilled treatment session focused on cognitive goals. SLP facilitated session by providing Min A question cues for recall of events from previous therapy session and Max A verbal cues to recall and sequence the steps for a safe transfer. Patient also required Min A verbal cues to self-monitor and correct verbal errors. Patient left upright in wheelchair with all needs within reach. Continue with current plan of care.     Function:  Cognition Comprehension Comprehension assist level: Understands basic 50 - 74% of the time/ requires cueing 25 - 49% of the time  Expression   Expression assist level: Expresses basic 25 - 49% of the time/requires cueing 50 - 75% of the time.  Uses single words/gestures.  Social Interaction Social Interaction assist level: Interacts appropriately 50 - 74% of the time - May be physically or verbally inappropriate.  Problem Solving Problem solving assist level: Solves basic 25 - 49% of the time - needs direction more than half the time to initiate, plan or complete simple activities  Memory Memory assist level: Recognizes or recalls 25 - 49% of the time/requires cueing 50 - 75% of the time    Pain No/Denies Pain   Therapy/Group: Individual Therapy  Rilda Bulls 06/13/2016, 3:29 PM

## 2016-06-14 ENCOUNTER — Ambulatory Visit (HOSPITAL_COMMUNITY): Payer: No Typology Code available for payment source | Admitting: Physical Therapy

## 2016-06-14 ENCOUNTER — Inpatient Hospital Stay (HOSPITAL_COMMUNITY): Payer: No Typology Code available for payment source | Admitting: Occupational Therapy

## 2016-06-14 ENCOUNTER — Inpatient Hospital Stay (HOSPITAL_COMMUNITY): Payer: No Typology Code available for payment source | Admitting: Speech Pathology

## 2016-06-14 ENCOUNTER — Encounter (HOSPITAL_COMMUNITY): Payer: No Typology Code available for payment source | Admitting: Occupational Therapy

## 2016-06-14 LAB — GLUCOSE, CAPILLARY
GLUCOSE-CAPILLARY: 289 mg/dL — AB (ref 65–99)
Glucose-Capillary: 170 mg/dL — ABNORMAL HIGH (ref 65–99)
Glucose-Capillary: 143 mg/dL — ABNORMAL HIGH (ref 65–99)
Glucose-Capillary: 183 mg/dL — ABNORMAL HIGH (ref 65–99)

## 2016-06-14 LAB — URINE CULTURE

## 2016-06-14 MED ORDER — GLIMEPIRIDE 4 MG PO TABS
4.0000 mg | ORAL_TABLET | Freq: Every day | ORAL | Status: DC
  Administered 2016-06-15 – 2016-06-16 (×2): 4 mg via ORAL
  Filled 2016-06-14 (×2): qty 1

## 2016-06-14 NOTE — Progress Notes (Signed)
Speech Language Pathology Daily Session Notes  Patient Details  Name: Paige Stewart MRN: [id number] Date of Birth: 03-Oct-1950  Today's Date: 06/14/2016   Session 1: SLP Individual Time: 1030-1100 SLP Individual Time Calculation (min): 30 min   Session 2: SLP Individual Time: 1430-1500 SLP Individual Time Calculation (min): 30 min  Short Term Goals: Week 3: SLP Short Term Goal 1 (Week 3): Patient will consume Dys.2 textures and thin with minimal overt s/s of aspiration with Min A verbal cues needed for use of swallowing compensatory strategies.  SLP Short Term Goal 2 (Week 3): Patient with demonstrate effective mastication and oral clearance of Dys.3 textures with Min A for 2 consecutive sessions to demonstrate readiness for upgrade.  SLP Short Term Goal 3 (Week 3): Patient will utilize an increased vocal intensity at the word and phrase level to achieve ~75% intelligibility with Min A verbal cues.  SLP Short Term Goal 4 (Week 3): Patient will self-monitor and correct verbal errors at the phrase level with Mod A multimodal cues.  SLP Short Term Goal 5 (Week 3): Patient will attend to right upper extremity during functional tasks with Min A multimodal cues. SLP Short Term Goal 6 (Week 3): Patient will demonstrate functional problem solving for basic and familiar tasks with Min A verbal cues.   Skilled Therapeutic Interventions:   Session 1: Skilled treatment session focused on dysphagia goals and family education. Patient consumed trials of Dys. 3 textures with thin liquids via straw with intermittent coughing, suspect due to bolus size with straw sips since coughing was eliminated once straw was removed. Recommend continued trials prior to upgrade. Patient's daughter and son-in-law present and educated in regards to current swallowing function, diet recommendations and appropriate textures. They verbalized understanding of all information. Patient left upright in wheelchair with all needs  within reach. Continue with current plan of care.   Session 2: Skilled treatment session focused on cognitive goals. SLP facilitated session by providing Min A verbal cues for problem solving and sequencing with transfer to the wheelchair via the [place]. SLP also provided Max A verbal cues for patient to follow commands with basic but novel task that focused on sustained attention. Patient handed off to OT. Continue with current plan of care.   Function:  Eating Eating   Modified Consistency Diet: Yes Eating Assist Level: Set up assist for;Supervision or verbal cues   Eating Set Up Assist For: Opening containers       Cognition Comprehension Comprehension assist level: Understands basic 50 - 74% of the time/ requires cueing 25 - 49% of the time  Expression   Expression assist level: Expresses basis less than 25% of the time/requires cueing >75% of the time.  Social Interaction Social Interaction assist level: Interacts appropriately 50 - 74% of the time - May be physically or verbally inappropriate.  Problem Solving Problem solving assist level: Solves basic 25 - 49% of the time - needs direction more than half the time to initiate, plan or complete simple activities  Memory Memory assist level: Recognizes or recalls less than 25% of the time/requires cueing greater than 75% of the time    Pain Pain Assessment Pain Assessment: No/denies pain  Therapy/Group: Individual Therapy  Abriel Hattery 06/14/2016, 4:32 PM

## 2016-06-14 NOTE — Progress Notes (Signed)
Subjective/Complaints: Daughter in for training, has 3rd floor apt no elevator, daughter plans to move ROS: Limited due cognitive   Objective: Vital Signs: Blood pressure 132/75, pulse 79, temperature 98.2 F (36.8 C), temperature source Oral, resp. rate 17, height 4' 11"  (1.499 m), weight 66.8 kg (147 lb 4.3 oz), SpO2 99 %. No results found. Results for orders placed or performed during the hospital encounter of 05/25/16 (from the past 72 hour(s))  Glucose, capillary     Status: Abnormal   Collection Time: 06/11/16 12:31 PM  Result Value Ref Range   Glucose-Capillary 140 (H) 65 - 99 mg/dL  Glucose, capillary     Status: Abnormal   Collection Time: 06/11/16  5:18 PM  Result Value Ref Range   Glucose-Capillary 114 (H) 65 - 99 mg/dL  Glucose, capillary     Status: Abnormal   Collection Time: 06/11/16  9:23 PM  Result Value Ref Range   Glucose-Capillary 169 (H) 65 - 99 mg/dL  Glucose, capillary     Status: Abnormal   Collection Time: 06/12/16  6:44 AM  Result Value Ref Range   Glucose-Capillary 109 (H) 65 - 99 mg/dL  Glucose, capillary     Status: Abnormal   Collection Time: 06/12/16 12:27 PM  Result Value Ref Range   Glucose-Capillary 102 (H) 65 - 99 mg/dL  Urine culture     Status: Abnormal   Collection Time: 06/12/16  3:35 PM  Result Value Ref Range   Specimen Description URINE, CATHETERIZED    Special Requests NONE    Culture >=100,000 COLONIES/mL ENTEROCOCCUS FAECALIS (A)    Report Status 06/14/2016 FINAL    Organism ID, Bacteria ENTEROCOCCUS FAECALIS (A)       Susceptibility   Enterococcus faecalis - MIC*    AMPICILLIN <=2 SENSITIVE Sensitive     LEVOFLOXACIN 1 SENSITIVE Sensitive     NITROFURANTOIN <=16 SENSITIVE Sensitive     VANCOMYCIN 1 SENSITIVE Sensitive     * >=100,000 COLONIES/mL ENTEROCOCCUS FAECALIS  Urinalysis, Routine w reflex microscopic     Status: None   Collection Time: 06/12/16  3:35 PM  Result Value Ref Range   Color, Urine YELLOW YELLOW    APPearance CLEAR CLEAR   Specific Gravity, Urine 1.012 1.005 - 1.030   pH 5.0 5.0 - 8.0   Glucose, UA NEGATIVE NEGATIVE mg/dL   Hgb urine dipstick NEGATIVE NEGATIVE   Bilirubin Urine NEGATIVE NEGATIVE   Ketones, ur NEGATIVE NEGATIVE mg/dL   Protein, ur NEGATIVE NEGATIVE mg/dL   Nitrite NEGATIVE NEGATIVE   Leukocytes, UA NEGATIVE NEGATIVE  Glucose, capillary     Status: None   Collection Time: 06/12/16  5:07 PM  Result Value Ref Range   Glucose-Capillary 68 65 - 99 mg/dL  Glucose, capillary     Status: Abnormal   Collection Time: 06/12/16  9:11 PM  Result Value Ref Range   Glucose-Capillary 249 (H) 65 - 99 mg/dL  Glucose, capillary     Status: Abnormal   Collection Time: 06/13/16  6:48 AM  Result Value Ref Range   Glucose-Capillary 162 (H) 65 - 99 mg/dL  Basic metabolic panel     Status: Abnormal   Collection Time: 06/13/16  6:50 AM  Result Value Ref Range   Sodium 138 135 - 145 mmol/L   Potassium 4.2 3.5 - 5.1 mmol/L   Chloride 106 101 - 111 mmol/L   CO2 24 22 - 32 mmol/L   Glucose, Bld 166 (H) 65 - 99 mg/dL   BUN 17  6 - 20 mg/dL   Creatinine, Ser 0.86 0.44 - 1.00 mg/dL   Calcium 9.0 8.9 - 10.3 mg/dL   GFR calc non Af Amer >60 >60 mL/min   GFR calc Af Amer >60 >60 mL/min    Comment: (NOTE) The eGFR has been calculated using the CKD EPI equation. This calculation has not been validated in all clinical situations. eGFR's persistently <60 mL/min signify possible Chronic Kidney Disease.    Anion gap 8 5 - 15  CBC with Differential/Platelet     Status: Abnormal   Collection Time: 06/13/16  6:50 AM  Result Value Ref Range   WBC 3.6 (L) 4.0 - 10.5 K/uL   RBC 3.87 3.87 - 5.11 MIL/uL   Hemoglobin 11.5 (L) 12.0 - 15.0 g/dL   HCT 33.6 (L) 36.0 - 46.0 %   MCV 86.8 78.0 - 100.0 fL   MCH 29.7 26.0 - 34.0 pg   MCHC 34.2 30.0 - 36.0 g/dL   RDW 13.1 11.5 - 15.5 %   Platelets 234 150 - 400 K/uL   Neutrophils Relative % 48 %   Neutro Abs 1.7 1.7 - 7.7 K/uL   Lymphocytes Relative  40 %   Lymphs Abs 1.4 0.7 - 4.0 K/uL   Monocytes Relative 9 %   Monocytes Absolute 0.3 0.1 - 1.0 K/uL   Eosinophils Relative 3 %   Eosinophils Absolute 0.1 0.0 - 0.7 K/uL   Basophils Relative 0 %   Basophils Absolute 0.0 0.0 - 0.1 K/uL  Glucose, capillary     Status: Abnormal   Collection Time: 06/13/16 12:56 PM  Result Value Ref Range   Glucose-Capillary 132 (H) 65 - 99 mg/dL  Glucose, capillary     Status: Abnormal   Collection Time: 06/13/16  5:22 PM  Result Value Ref Range   Glucose-Capillary 118 (H) 65 - 99 mg/dL  Glucose, capillary     Status: Abnormal   Collection Time: 06/13/16 10:02 PM  Result Value Ref Range   Glucose-Capillary 259 (H) 65 - 99 mg/dL  Glucose, capillary     Status: Abnormal   Collection Time: 06/14/16  6:45 AM  Result Value Ref Range   Glucose-Capillary 170 (H) 65 - 99 mg/dL     HEENT: Normocephalic, atraumatic Cardio:RRR. Resp: CTA bilaterally. Normal effort GI: BS positive and ND Skin:   Intact. Warm and dry. Neuro: Flat, word finding deficits.  Motor 0 /5 in RUE and 2- hip/knee ext synergy RLE Musc/Skel:  No edema. No tenderness.no pain with ROM RIght wrist , no effusion or erythema, mild medial R ankle pain with palp no pain with ROM, no effusion  Gen NAD. Vital signs reviewed.   Assessment/Plan: 1. Functional deficits secondary to Basal ganglia infarct which require 3+ hours per day of interdisciplinary therapy in a comprehensive inpatient rehab setting. Physiatrist is providing close team supervision and 24 hour management of active medical problems listed below. Physiatrist and rehab team continue to assess barriers to discharge/monitor patient progress toward functional and medical goals. FIM: Function - Bathing Bathing activity did not occur: N/A Position: Bed Body parts bathed by patient: Right arm, Chest, Abdomen, Front perineal area, Right upper leg, Left upper leg Body parts bathed by helper: Front perineal area, Buttocks, Right  upper leg, Left upper leg Assist Level: 2 helpers  Function- Upper Body Dressing/Undressing Upper body dressing/undressing activity did not occur: N/A What is the patient wearing?: Pull over shirt/dress Pull over shirt/dress - Perfomed by patient: Thread/unthread left sleeve, Put head through  opening Pull over shirt/dress - Perfomed by helper: Thread/unthread right sleeve, Pull shirt over trunk Button up shirt - Perfomed by patient: Thread/unthread right sleeve Button up shirt - Perfomed by helper: Button/unbutton shirt Assist Level: 2 helpers Function - Lower Body Dressing/Undressing Lower body dressing/undressing activity did not occur: N/A What is the patient wearing?: Pants Position: Wheelchair/chair at sink Underwear - Performed by helper: Pull underwear up/down Pants- Performed by patient: Thread/unthread left pants leg Pants- Performed by helper: Thread/unthread right pants leg, Thread/unthread left pants leg, Pull pants up/down Non-skid slipper socks- Performed by patient: Don/doff right sock, Don/doff left sock Non-skid slipper socks- Performed by helper: Don/doff left sock Socks - Performed by patient: Don/doff left sock Socks - Performed by helper: Don/doff left sock Assist for footwear: Supervision/touching assist Assist for lower body dressing:  (MAX A)  Function - Toileting Toileting activity did not occur: No continent bowel/bladder event Toileting steps completed by patient: Adjust clothing prior to toileting Toileting steps completed by helper: Adjust clothing prior to toileting, Performs perineal hygiene, Adjust clothing after toileting Toileting Assistive Devices: Grab bar or rail Assist level: Touching or steadying assistance (Pt.75%)  Function - Air cabin crew transfer activity did not occur:  (not attempted) Toilet transfer assistive device: Drop arm commode, Bedside commode, Sliding board Mechanical lift: Stedy Assist level to toilet: Touching or  steadying assistance (Pt > 75%) Assist level from toilet: Touching or steadying assistance (Pt > 75%) Assist level to bedside commode (at bedside): Touching or steadying assistance (Pt > 75%) Assist level from bedside commode (at bedside): Touching or steadying assistance (Pt > 75%)  Function - Chair/bed transfer Chair/bed transfer method: Lateral scoot Chair/bed transfer assist level: Touching or steadying assistance (Pt > 75%) Chair/bed transfer assistive device: Armrests, Sliding board Chair/bed transfer details: Tactile cues for weight shifting, Tactile cues for placement, Visual cues for safe use of DME/AE  Function - Locomotion: Wheelchair Will patient use wheelchair at discharge?: Yes Type: Manual Max wheelchair distance: 150 Assist Level: Touching or steadying assistance (Pt > 75%) Assist Level: Touching or steadying assistance (Pt > 75%) Assist Level: Touching or steadying assistance (Pt > 75%) Turns around,maneuvers to table,bed, and toilet,negotiates 3% grade,maneuvers on rugs and over doorsills: No Function - Locomotion: Ambulation Assistive device: Rail in hallway Max distance: 10 Assist level: 2 helpers (max A with +2 for w/c follow) Assist level: 2 helpers Walk 50 feet with 2 turns activity did not occur: Safety/medical concerns Walk 150 feet activity did not occur: Safety/medical concerns Walk 10 feet on uneven surfaces activity did not occur: Safety/medical concerns  Function - Comprehension Comprehension: Auditory Comprehension assist level: Understands basic 50 - 74% of the time/ requires cueing 25 - 49% of the time  Function - Expression Expression: Verbal Expression assist level: Expresses basic 25 - 49% of the time/requires cueing 50 - 75% of the time. Uses single words/gestures.  Function - Social Interaction Social Interaction assist level: Interacts appropriately 50 - 74% of the time - May be physically or verbally inappropriate.  Function - Problem  Solving Problem solving assist level: Solves basic 25 - 49% of the time - needs direction more than half the time to initiate, plan or complete simple activities  Function - Memory Memory assist level: Recognizes or recalls 25 - 49% of the time/requires cueing 50 - 75% of the time Patient normally able to recall (first 3 days only): That he or she is in a hospital  Medical Problem List and Plan: 1.  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 Formulized individual treatment plan based on medical history, underlying problem and comorbidities. 2.  DVT Prophylaxis/Anticoagulation: Pharmaceutical: Lovenox,    -Vascular study with small right baker's cyst, otherwise unremarkable 3. Pain Management: Added Sportscreme for back pain.  4. Mood: LCSW to follow for evaluation and support  -paxil trial for depression.  5. Neuropsych: This patient is not capable of making decisions on her own behalf. Ask neuropsych to eval, poor initiation difficulty following PT instructions, ? Apraxia , post stroke depression 6. Skin/Wound Care: pressure relief measures. 7. Fluids/Electrolytes/Nutrition: Monitor I/O.  8. HTN: monitor BP bid--continue HCTZ.   Variable systolic or diastolic HTN ,off hctz  toprol dose to 55m Vitals:   06/13/16 0509 06/14/16 0322  BP: 132/75   Pulse: 79   Resp: 18 17  Temp: 98.2 F (36.8 C) 98.2 F (36.8 C)    9. Hypokalemia: resolved on  supplement.  K+ 4.2 on 4/16  11. NSTEMI: Will need follow up NUC study in the future.  12. T2DM with peripheral neuropathy: Monitor BS ac/hs. Hgb A1c- 10.3- .       -increase amaryl to 377mIncrease to 66m38m, diarrhea may be related to  metformin resolved, pt off metformin (last 3)   Recent Labs  06/13/16 1722 06/13/16 2202 06/14/16 0645   GLUCAP 118* 259* 170*   13.  ARF- prerenal azotemia  cont .45 NS qhs, EF 45% no signs overload, weights stable around 63kg  -follow up labs normal, poor fluid intake  IVF at noc go back to 64m3m , recorded po fluids only 360ml766m8 , BMET nl 4/24 14.  Hematuria- improved still occ, likely catheter related plus enoxaparin,plavix and ASA 15.  Loose stool resolved off metformin and  fibercon  LOS (Days) 20 A FACE TO FACE EVALUATION WAS PERFORMED  Aprile Dickenson E 06/14/2016, 9:25 AM

## 2016-06-14 NOTE — Patient Care Conference (Signed)
Inpatient RehabilitationTeam Conference and Plan of Care Update Date: 06/14/2016   Time: 11:20 AM    Patient Name: Paige Stewart      Medical Record Number: [id number]  Date of Birth: 02/27/1950 Sex: Female         Room/Bed: 4W18C/4W18C-01 Payor Info: Payor: [employer] MULT[employer]LAN / Plan: M[employer]AN [employer] / Product Type: *No Product type* /    Admitting Diagnosis: L MCA Infarct   Admit Date/Time:  05/25/2016  6:49 PM Admission Comments: No comment available   Primary Diagnosis:  <principal problem not specified> Principal Problem: <principal problem not specified>  Patient Active Problem List   Diagnosis Date Noted  . Poorly controlled type 2 diabetes mellitus with peripheral neuropathy (HCC)   . Right hemiplegia (HCC)   . Benign essential HTN   . E. coli UTI   . NSTEMI (non-ST elevated myocardial infarction) (HCC)   . Diabetic peripheral neuropathy (HCC)   . Basal ganglia infarction (HCC) 05/25/2016  . Type 2 diabetes mellitus without complication, without long-term current use of insulin (HCC)   . Urinary tract infection without hematuria   . Demand ischemia (HCC)   . Hypertension   . Acute right hemiparesis (HCC)   . 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    Expected Discharge Date: Expected Discharge Date: 06/16/16  Team Members Present: Physician leading conference: Dr. Claudette Laws Social Worker Present: Dossie Der, LCSW Nurse Present: Carmie End, RN PT Present: Teodoro Kil, PT OT Present: Kearney Hard, OT SLP Present: Feliberto Gottron, SLP PPS Coordinator present : Tora Duck, RN, CRRN     Current Status/Progress Goal Weekly Team Focus  Medical   DM fair control, incont of bowel and bladder  maintain hydration po route, toileting program  family training   Bowel/Bladder   continent/ incontinent at times,PVR prn with IN and OUT cath  toileting more often  be continent most of the time   Swallow/Nutrition/ Hydration   Dys. 2 textures with thin liquids, Min A  for use of strategies   Min A with least restrictive diet  increased use of swallowing strategies    ADL's   Min A slideboard transfers,  Min A UB bathing         Mobility   min/mod overall  downgraded to min/mod overall  d/c planning, family education, transfer training, w/c propulsion    Communication   Min A for speech intelligibility   Min A      Safety/Cognition/ Behavioral Observations  Mod A  Min A  attention, problem solving, recall    Pain   no complains of pain  pain free  monitor and assess it evry shift and as need it   Skin   dry and intact  no breakdown this hospitalization  monitor every shift      *See Care Plan and progress notes for long and short-term goals.  Barriers to Discharge: intermittent cath    Possible Resolutions to Barriers:  daughter is ICU RN , can cath if needed    Discharge Planning/Teaching Needs:  Daughter to be in today to learn Mom's care and is planning to hire assist. Does not have coverage for NHP unless family anted to pay privately in a NH      Team Discussion:  Progressing toward her goals of min/mod level. Slow progress and iniatation poor. Family education today with daughter and son in-law going well. Working on continence and better with family present. Medically stable for DC Friday.  Revisions to Treatment Plan:  DC  4/27   Continued Need for Acute Rehabilitation Level of Care: The patient requires daily medical management by a physician with specialized training in physical medicine and rehabilitation for the following conditions: Daily direction of a multidisciplinary physical rehabilitation program to ensure safe treatment while eliciting the highest outcome that is of practical value to the patient.: Yes Daily medical management of patient stability for increased activity during participation in an intensive rehabilitation regime.: Yes Daily analysis of laboratory values and/or radiology reports with any subsequent need for  medication adjustment of medical intervention for : Neurological problems;Diabetes problems  Santiel Topper, Lemar Livings 06/15/2016, 8:57 AM

## 2016-06-14 NOTE — Progress Notes (Signed)
Social Work Patient ID: Paige Stewart, female   DOB: 10/15/1950, 66 y.o.   MRN: [id number]  Family education went well and both daughter and son in-law feel comfortable with pt's care. Discussed equipment and follow up Needs. Will ask [facility] to contact daughter to set up delivery of the hospital bed for tomorrow. Daughter and son in-law moving next Friday to a second floor apartment that is handicapped accessible which will help Pt and her care. Aware of team conference and goals being met, feel confident about discharge Friday.

## 2016-06-14 NOTE — Progress Notes (Signed)
Physical Therapy Session Note  Patient Details  Name: Anslie Spadafora MRN: [id number] Date of Birth: [birth year]  Today's Date: 06/14/2016 PT Individual Time: 0945-1030 PT Individual Time Calculation (min): 45 min   Short Term Goals: Week 3:  PT Short Term Goal 1 (Week 3): =LTGs due to ELOS  Skilled Therapeutic Interventions/Progress Updates:    no c/o pain.  Session focus on family education for transfers with and without slide board as well as for w/c propulsion.  PT provided supervision and mod fade to min verbal cues for safe set up of slide board and squat/pivot transfers.  Pt's daughter, Merci, completed all transfers while son-in-law observed and asked appropriate questions.  Pt's son-in-law able to teach back set up and completion of transfers to therapist at end of session.  PT demonstrated to pt's family w/c propulsion with L hemi technique with min tactile cues at L knee and they returned demonstration.  Pt returned to room at end of session and left with family present.    Therapy Documentation Precautions:  Precautions Precautions: Fall Precaution Comments: dense R hemiparesis Restrictions Weight Bearing Restrictions: No   See Function Navigator for Current Functional Status.   Therapy/Group: Individual Therapy  Sorina Derrig E Penven-Crew 06/14/2016, 11:15 AM

## 2016-06-14 NOTE — Progress Notes (Signed)
Occupational Therapy Session Note  Patient Details  Name: Paige Stewart MRN: [id number] Date of Birth: 1950-10-17  Today's Date: 06/14/2016   Session 1 OT Individual Time: 4098-1191 OT Individual Time Calculation (min): 45 min   Session 2 OT Individual Time: 4782-9562 OT Individual Time Calculation (min): 30 min    Short Term Goals: Week 3:  OT Short Term Goal 1 (Week 3): LTG=STG 2/2 estimated LOS  Skilled Therapeutic Interventions/Progress Updates:  Session 2   OT treatment session focused on pt/family education, transfer training, and modified bathing/dressing techniques. Educated pt's daughter on body mechanics and positions to safely assist pt with SB transfers. Daughter able to place SB and assist with transfer to L and R with min A. Educated daughter on ways to cue pt for safety and discussed toilet transfers using wide BSC and bed level bathing/dressing techniques. Pt sate EOB and was set-up for sponge bath with assist for L UE bathing only. Pt then able to don shirt with Min set-up A and increased time- able to maintain sitting balance at EOB w/ supervision. Educated on bed level LB dressing techniques for safety within home environment. Hand-off to PT with pt seated EOB.  Session 2 Pt greeted handoff from SLP. OT treatment session focused on R NMR and transfer training. SB transfer > R with min A. R NMR seated on therapy mat focused on elbow/wrist/hand flex/ext, scapula elevation/depression with joint input provided to bring pt through full ROM. Increased flexor tone noted in fingers/wrist/elbow-Discussed wearing resting hand plint at night with pt. Pt completed SB transfer back to wc, then back to bed with min/Mod A overall.   Therapy Documentation Precautions:  Precautions Precautions: Fall Precaution Comments: dense R hemiparesis Restrictions Weight Bearing Restrictions: No Pain: Pain Assessment Pain Assessment: No/denies pain ADL: ADL ADL Comments: Please see  functional navigator  See Function Navigator for Current Functional Status.   Therapy/Group: Individual Therapy  Mal Amabile 06/14/2016, 4:07 PM

## 2016-06-14 NOTE — Plan of Care (Signed)
Problem: RH Car Transfers Goal: LTG Patient will perform car transfers with assist (PT) LTG: Patient will perform car transfers with assistance (PT).  Outcome: Not Applicable Date Met: 32/91/91 D/c 4/25, pt for ambulance transport

## 2016-06-15 ENCOUNTER — Inpatient Hospital Stay (HOSPITAL_COMMUNITY): Payer: No Typology Code available for payment source | Admitting: Speech Pathology

## 2016-06-15 ENCOUNTER — Inpatient Hospital Stay (HOSPITAL_COMMUNITY): Payer: No Typology Code available for payment source | Admitting: Physical Therapy

## 2016-06-15 ENCOUNTER — Inpatient Hospital Stay (HOSPITAL_COMMUNITY): Payer: No Typology Code available for payment source | Admitting: Occupational Therapy

## 2016-06-15 LAB — GLUCOSE, CAPILLARY
GLUCOSE-CAPILLARY: 186 mg/dL — AB (ref 65–99)
GLUCOSE-CAPILLARY: 187 mg/dL — AB (ref 65–99)
GLUCOSE-CAPILLARY: 213 mg/dL — AB (ref 65–99)
Glucose-Capillary: 370 mg/dL — ABNORMAL HIGH (ref 65–99)

## 2016-06-15 NOTE — Progress Notes (Signed)
Subjective/Complaints: No issues overnite ROS: Limited due cognitive   Objective: Vital Signs: Blood pressure 120/67, pulse 81, temperature 98.1 F (36.7 C), temperature source Oral, resp. rate 18, height _0  (1.499 m), weight 66.8 kg (147 lb 4.3 oz), SpO2 100 %. No results found. Results for orders placed or performed during the hospital encounter of 05/25/16 (from the past 72 hour(s))  Glucose, capillary     Status: Abnormal   Collection Time: 06/12/16 12:27 PM  Result Value Ref Range   Glucose-Capillary 102 (H) 65 - 99 mg/dL  Urine culture     Status: Abnormal   Collection Time: 06/12/16  3:35 PM  Result Value Ref Range   Specimen Description URINE, CATHETERIZED    Special Requests NONE    Culture >=100,000 COLONIES/mL ENTEROCOCCUS FAECALIS (A)    Report Status 06/14/2016 FINAL    Organism ID, Bacteria ENTEROCOCCUS FAECALIS (A)       Susceptibility   Enterococcus faecalis - MIC*    AMPICILLIN <=2 SENSITIVE Sensitive     LEVOFLOXACIN 1 SENSITIVE Sensitive     NITROFURANTOIN <=16 SENSITIVE Sensitive     VANCOMYCIN 1 SENSITIVE Sensitive     * >=100,000 COLONIES/mL ENTEROCOCCUS FAECALIS  Urinalysis, Routine w reflex microscopic     Status: None   Collection Time: 06/12/16  3:35 PM  Result Value Ref Range   Color, Urine YELLOW YELLOW   APPearance CLEAR CLEAR   Specific Gravity, Urine 1.012 1.005 - 1.030   pH 5.0 5.0 - 8.0   Glucose, UA NEGATIVE NEGATIVE mg/dL   Hgb urine dipstick NEGATIVE NEGATIVE   Bilirubin Urine NEGATIVE NEGATIVE   Ketones, ur NEGATIVE NEGATIVE mg/dL   Protein, ur NEGATIVE NEGATIVE mg/dL   Nitrite NEGATIVE NEGATIVE   Leukocytes, UA NEGATIVE NEGATIVE  Glucose, capillary     Status: None   Collection Time: 06/12/16  5:07 PM  Result Value Ref Range   Glucose-Capillary 68 65 - 99 mg/dL  Glucose, capillary     Status: Abnormal   Collection Time: 06/12/16  9:11 PM  Result Value Ref Range   Glucose-Capillary 249 (H) 65 - 99 mg/dL  Glucose,  capillary     Status: Abnormal   Collection Time: 06/13/16  6:48 AM  Result Value Ref Range   Glucose-Capillary 162 (H) 65 - 99 mg/dL  Basic metabolic panel     Status: Abnormal   Collection Time: 06/13/16  6:50 AM  Result Value Ref Range   Sodium 138 135 - 145 mmol/L   Potassium 4.2 3.5 - 5.1 mmol/L   Chloride 106 101 - 111 mmol/L   CO2 24 22 - 32 mmol/L   Glucose, Bld 166 (H) 65 - 99 mg/dL   BUN 17 6 - 20 mg/dL   Creatinine, Ser 0.86 0.44 - 1.00 mg/dL   Calcium 9.0 8.9 - 10.3 mg/dL   GFR calc non Af Amer >60 >60 mL/min   GFR calc Af Amer >60 >60 mL/min    Comment: (NOTE) The eGFR has been calculated using the CKD EPI equation. This calculation has not been validated in all clinical situations. eGFR's persistently <60 mL/min signify possible Chronic Kidney Disease.    Anion gap 8 5 - 15  CBC with Differential/Platelet     Status: Abnormal   Collection Time: 06/13/16  6:50 AM  Result Value Ref Range   WBC 3.6 (L) 4.0 - 10.5 K/uL   RBC 3.87 3.87 - 5.11 MIL/uL   Hemoglobin 11.5 (L) 12.0 - 15.0 g/dL  HCT 33.6 (L) 36.0 - 46.0 %   MCV 86.8 78.0 - 100.0 fL   MCH 29.7 26.0 - 34.0 pg   MCHC 34.2 30.0 - 36.0 g/dL   RDW 13.1 11.5 - 15.5 %   Platelets 234 150 - 400 K/uL   Neutrophils Relative % 48 %   Neutro Abs 1.7 1.7 - 7.7 K/uL   Lymphocytes Relative 40 %   Lymphs Abs 1.4 0.7 - 4.0 K/uL   Monocytes Relative 9 %   Monocytes Absolute 0.3 0.1 - 1.0 K/uL   Eosinophils Relative 3 %   Eosinophils Absolute 0.1 0.0 - 0.7 K/uL   Basophils Relative 0 %   Basophils Absolute 0.0 0.0 - 0.1 K/uL  Glucose, capillary     Status: Abnormal   Collection Time: 06/13/16 12:56 PM  Result Value Ref Range   Glucose-Capillary 132 (H) 65 - 99 mg/dL  Glucose, capillary     Status: Abnormal   Collection Time: 06/13/16  5:22 PM  Result Value Ref Range   Glucose-Capillary 118 (H) 65 - 99 mg/dL  Glucose, capillary     Status: Abnormal   Collection Time: 06/13/16 10:02 PM  Result Value Ref Range    Glucose-Capillary 259 (H) 65 - 99 mg/dL  Glucose, capillary     Status: Abnormal   Collection Time: 06/14/16  6:45 AM  Result Value Ref Range   Glucose-Capillary 170 (H) 65 - 99 mg/dL  Glucose, capillary     Status: Abnormal   Collection Time: 06/14/16 11:49 AM  Result Value Ref Range   Glucose-Capillary 143 (H) 65 - 99 mg/dL  Glucose, capillary     Status: Abnormal   Collection Time: 06/14/16  5:17 PM  Result Value Ref Range   Glucose-Capillary 183 (H) 65 - 99 mg/dL  Glucose, capillary     Status: Abnormal   Collection Time: 06/14/16  9:37 PM  Result Value Ref Range   Glucose-Capillary 289 (H) 65 - 99 mg/dL   Comment 1 Notify RN    Comment 2 Document in Chart   Glucose, capillary     Status: Abnormal   Collection Time: 06/15/16  6:51 AM  Result Value Ref Range   Glucose-Capillary 186 (H) 65 - 99 mg/dL     HEENT: Normocephalic, atraumatic Cardio:RRR. Resp: CTA bilaterally. Normal effort GI: BS positive and ND Skin:   Intact. Warm and dry. Neuro: Flat, word finding deficits.  Motor 0 /5 in RUE and 2- hip/knee ext synergy RLE  Tone MAS 2 at elbow Musc/Skel:  No edema. No tenderness.no pain with ROM RIght wrist , no effusion or erythema, mild medial R elbow pain  with ROM, no effusion  Gen NAD. Vital signs reviewed.   Assessment/Plan: 1. Functional deficits secondary to Basal ganglia infarct which require 3+ hours per day of interdisciplinary therapy in a comprehensive inpatient rehab setting. Physiatrist is providing close team supervision and 24 hour management of active medical problems listed below. Physiatrist and rehab team continue to assess barriers to discharge/monitor patient progress toward functional and medical goals. FIM: Function - Bathing Bathing activity did not occur: N/A Position: Sitting EOB Body parts bathed by patient: Chest, Abdomen, Right upper leg, Left upper leg, Left lower leg, Right arm Body parts bathed by helper: Right lower leg, Front perineal  area, Buttocks, Left arm Assist Level: Touching or steadying assistance(Pt > 75%)  Function- Upper Body Dressing/Undressing Upper body dressing/undressing activity did not occur: N/A What is the patient wearing?: Pull over shirt/dress Pull over  shirt/dress - Perfomed by patient: Thread/unthread right sleeve, Thread/unthread left sleeve, Put head through opening Pull over shirt/dress - Perfomed by helper: Pull shirt over trunk Button up shirt - Perfomed by patient: Thread/unthread right sleeve Button up shirt - Perfomed by helper: Button/unbutton shirt Assist Level: Touching or steadying assistance(Pt > 75%) Function - Lower Body Dressing/Undressing Lower body dressing/undressing activity did not occur: N/A What is the patient wearing?: Pants, Non-skid slipper socks Position: Bed Underwear - Performed by helper: Pull underwear up/down Pants- Performed by patient: Thread/unthread left pants leg, Thread/unthread right pants leg Pants- Performed by helper: Pull pants up/down Non-skid slipper socks- Performed by patient: Don/doff right sock, Don/doff left sock Non-skid slipper socks- Performed by helper: Don/doff left sock Socks - Performed by patient: Don/doff left sock Socks - Performed by helper: Don/doff right sock Assist for footwear: Supervision/touching assist Assist for lower body dressing: Touching or steadying assistance (Pt > 75%)  Function - Toileting Toileting activity did not occur: No continent bowel/bladder event Toileting steps completed by patient: Performs perineal hygiene Toileting steps completed by helper: Adjust clothing prior to toileting, Adjust clothing after toileting Toileting Assistive Devices: Grab bar or rail Assist level: Touching or steadying assistance (Pt.75%)  Function - Air cabin crew transfer activity did not occur:  (not attempted) Toilet transfer assistive device: Bedside commode, Sliding board Mechanical lift: Stedy Assist level to  toilet: Touching or steadying assistance (Pt > 75%) Assist level from toilet: Touching or steadying assistance (Pt > 75%) Assist level to bedside commode (at bedside): Moderate assist (Pt 50 - 74%/lift or lower) Assist level from bedside commode (at bedside): Moderate assist (Pt 50 - 74%/lift or lower)  Function - Chair/bed transfer Chair/bed transfer method: Lateral scoot Chair/bed transfer assist level: Touching or steadying assistance (Pt > 75%) Chair/bed transfer assistive device: Armrests, Sliding board Chair/bed transfer details: Tactile cues for weight shifting, Tactile cues for placement, Visual cues for safe use of DME/AE  Function - Locomotion: Wheelchair Will patient use wheelchair at discharge?: Yes Type: Manual Max wheelchair distance: 150 Assist Level: Touching or steadying assistance (Pt > 75%) Assist Level: Touching or steadying assistance (Pt > 75%) Assist Level: Touching or steadying assistance (Pt > 75%) Turns around,maneuvers to table,bed, and toilet,negotiates 3% grade,maneuvers on rugs and over doorsills: No Function - Locomotion: Ambulation Assistive device: Rail in hallway Max distance: 10 Assist level: 2 helpers (max A with +2 for w/c follow) Assist level: 2 helpers Walk 50 feet with 2 turns activity did not occur: Safety/medical concerns Walk 150 feet activity did not occur: Safety/medical concerns Walk 10 feet on uneven surfaces activity did not occur: Safety/medical concerns  Function - Comprehension Comprehension: Auditory Comprehension assist level: Understands basic 50 - 74% of the time/ requires cueing 25 - 49% of the time  Function - Expression Expression: Verbal Expression assist level: Expresses basis less than 25% of the time/requires cueing >75% of the time.  Function - Social Interaction Social Interaction assist level: Interacts appropriately 50 - 74% of the time - May be physically or verbally inappropriate.  Function - Problem  Solving Problem solving assist level: Solves basic 25 - 49% of the time - needs direction more than half the time to initiate, plan or complete simple activities  Function - Memory Memory assist level: Recognizes or recalls less than 25% of the time/requires cueing greater than 75% of the time Patient normally able to recall (first 3 days only): That he or she is in a hospital  Medical Problem List and  Plan: 1.  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  DVT Prophylaxis/Anticoagulation: Pharmaceutical: Lovenox,    -Vascular study with small right baker's cyst, otherwise unremarkable 3. Pain Management: Added Sportscreme for back pain.  4. Mood: LCSW to follow for evaluation and support  -paxil trial for depression.  5. Neuropsych: This patient is not capable of making decisions on her own behalf. Ask neuropsych to eval, poor initiation difficulty following PT instructions, ? Apraxia , post stroke depression 6. Skin/Wound Care: pressure relief measures. 7. Fluids/Electrolytes/Nutrition: Monitor I/O.  8. HTN: monitor BP bid--continue HCTZ.   Variable systolic or diastolic HTN ,off hctz  toprol dose to 48m Vitals:   06/14/16 1400 06/15/16 0531  BP: 120/67   Pulse: 81   Resp: 18 18  Temp: 97.7 F (36.5 C) 98.1 F (36.7 C)    9. Hypokalemia: resolved on  supplement.  K+ 4.2 on 4/16  11. NSTEMI: Will need follow up NUC study in the future.  12. T2DM with peripheral neuropathy: Monitor BS ac/hs. Hgb A1c- 10.3- .       -increase amaryl to 31mIncrease to 85m27m, diarrhea may be related to  metformin resolved, pt off metformin (last 3)   Recent Labs  06/14/16 1717 06/14/16 2137 06/15/16 0651  GLUCAP 183* 289* 186*   13.  ARF- prerenal azotemia  cont .45 NS qhs, EF 45% no signs overload, weights stable around 63kg  -follow up labs normal, poor fluid intake  IVF at noc go back to 48m35m , recorded po fluids only  360ml57m8 , BMET nl 4/24 14.  Hematuria- improved still occ, likely catheter related plus enoxaparin,plavix and ASA 15.  Loose stool resolved off metformin and  fibercon  LOS (Days) 21 A FACE TO FACE EVALUATION WAS PERFORMED  Paige Stewart E 06/15/2016, 7:48 AM

## 2016-06-15 NOTE — Progress Notes (Signed)
Speech Language Pathology Daily Session Note  Patient Details  Name: Paige Stewart MRN: [id number] Date of Birth: 05-Jul-1950  Today's Date: 06/15/2016  Session 1: SLP Individual Time: 0800-0900 SLP Individual Time Calculation (min): 60 min  Session 2: SLP Individual Time: 4098-1191 SLP Individual Time Calculation (min): 15 min  Short Term Goals: Week 3: SLP Short Term Goal 1 (Week 3): Patient will consume Dys.2 textures and thin with minimal overt s/s of aspiration with Min A verbal cues needed for use of swallowing compensatory strategies.  SLP Short Term Goal 2 (Week 3): Patient with demonstrate effective mastication and oral clearance of Dys.3 textures with Min A for 2 consecutive sessions to demonstrate readiness for upgrade.  SLP Short Term Goal 3 (Week 3): Patient will utilize an increased vocal intensity at the word and phrase level to achieve ~75% intelligibility with Min A verbal cues.  SLP Short Term Goal 4 (Week 3): Patient will self-monitor and correct verbal errors at the phrase level with Mod A multimodal cues.  SLP Short Term Goal 5 (Week 3): Patient will attend to right upper extremity during functional tasks with Min A multimodal cues. SLP Short Term Goal 6 (Week 3): Patient will demonstrate functional problem solving for basic and familiar tasks with Min A verbal cues.   Skilled Therapeutic Interventions:  Session 1: Skilled treatment session focused on dysphagia goals and completion of family education. Patient consumed breakfast meal of Dys. 2 textures (which was actually Dys. 3 textures) with thin liquids without overt s/s of aspiration and Min A verbal cues needed for use of small bites/sips, a slow rate of self-feeding and to monitor right anterior spillage. Patient's daughter present and provided appropriate verbal cues throughout session. Patient was 100% intelligible at the phrase level. Patient left upright in wheelchair with daughter present. Continue with current  plan of care.    Session 2: Skilled treatment session focused on dysphagia goals. Patient consumed lunch meal of Dys. 3 textures with thin liquids without overt s/s of aspiration and Min A verbal cues for use of small bites/sips and to clear oral residue. Recommend patient upgrade to Dys. 3 textures. Patient left upright with NT to complete meal. Continue with current plan of care.      Function:  Eating Eating   Modified Consistency Diet: Yes Eating Assist Level: Set up assist for;Supervision or verbal cues   Eating Set Up Assist For: Opening containers       Cognition Comprehension Comprehension assist level: Understands basic 50 - 74% of the time/ requires cueing 25 - 49% of the time  Expression   Expression assist level: Expresses basic 50 - 74% of the time/requires cueing 25 - 49% of the time. Needs to repeat parts of sentences.  Social Interaction Social Interaction assist level: Interacts appropriately 50 - 74% of the time - May be physically or verbally inappropriate.  Problem Solving Problem solving assist level: Solves basic 25 - 49% of the time - needs direction more than half the time to initiate, plan or complete simple activities  Memory Memory assist level: Recognizes or recalls less than 25% of the time/requires cueing greater than 75% of the time    Pain Pain Assessment Pain Assessment: No/denies pain  Therapy/Group: Individual Therapy  Thi Klich 06/15/2016, 3:27 PM

## 2016-06-15 NOTE — Progress Notes (Signed)
Occupational Therapy Discharge Summary  Patient Details  Name: Paige Stewart MRN: [id number] Date of Birth: [birth year]  Today's Date: 06/15/2016 OT Individual Time: 1001-1100 OT Individual Time Calculation (min): 59 min    Patient has met 11 of 11 long term goals due to improved activity tolerance, improved balance, postural control, ability to compensate for deficits, improved attention and improved awareness.  Patient to discharge at overall Mod Assist level.  Patient's care partner is independent to provide the necessary physical and cognitive assistance at discharge.    Reasons goals not met: n/a  Recommendation:  Patient will benefit from ongoing skilled OT services in home health setting to continue to advance functional skills in the area of BADL and Reduce care partner burden.  Equipment: tub transfer bench, wide drop arm BSC, hospital bed, w/c,   Reasons for discharge: treatment goals met and discharge from hospital  Patient/family agrees with progress made and goals achieved: Yes  OT Discharge Precautions/Restrictions  Precautions Precautions: Fall Precaution Comments: R hemiparesis Restrictions Weight Bearing Restrictions: No Pain Pain Assessment Pain Assessment: No/denies pain ADL ADL Eating: Supervision/safety Grooming: Supervision/safety, Setup Upper Body Bathing: Minimal assistance Where Assessed-Upper Body Bathing: Shower Lower Body Bathing: Moderate assistance Where Assessed-Lower Body Bathing: Shower Upper Body Dressing: Minimal assistance Where Assessed-Upper Body Dressing: Edge of bed Lower Body Dressing: Moderate assistance Where Assessed-Lower Body Dressing: Bed level Toilet Transfer Method: Theatre manager: Extra wide drop arm bedside commode ADL Comments: Please see functional navigator Cognition Overall Cognitive Status: Impaired/Different from baseline Arousal/Alertness: Awake/alert Orientation Level: Other  (comment) (not able to state birthday (June 07, 1950), or today's date (April [birth year] and October 1926)) Attention: Selective Focused Attention: Appears intact Sustained Attention: Appears intact Selective Attention: Impaired Memory: Impaired Memory Impairment: Decreased recall of new information;Storage deficit Awareness: Impaired Problem Solving: Impaired Safety/Judgment: Impaired Sensation Sensation Light Touch: Appears Intact Coordination Gross Motor Movements are Fluid and Coordinated: No Fine Motor Movements are Fluid and Coordinated: No Coordination and Movement Description: dense R hemiparesis Motor  Motor Motor: Hemiplegia;Abnormal postural alignment and control Motor - Discharge Observations: ongoing R hemiparesis (dense), improving postural control Mobility  Bed Mobility Bed Mobility: Supine to Sit Supine to Sit: HOB elevated;With rails;4: Min assist Supine to Sit Details (indicate cue type and reason): min assist to bring RLE to the EOB Transfers Sit to Stand: 3: Mod assist Sit to Stand Details: Verbal cues for technique;Verbal cues for precautions/safety;Verbal cues for safe use of DME/AE;Manual facilitation for weight shifting Stand to Sit: 4: Min guard  Trunk/Postural Assessment  Cervical Assessment Cervical Assessment: Within Functional Limits Thoracic Assessment Thoracic Assessment: Within Functional Limits Lumbar Assessment Lumbar Assessment: Within Functional Limits Postural Control Postural Control: Deficits on evaluation Righting Reactions: delayed to R, improving to L Protective Responses: delayed to R, improving to L Postural Limitations: decreased  Balance Balance Balance Assessed: Yes Static Sitting Balance Static Sitting - Balance Support: No upper extremity supported Static Sitting - Level of Assistance: 5: Stand by assistance Dynamic Sitting Balance Dynamic Sitting - Balance Support: No upper extremity supported Dynamic Sitting - Level of  Assistance: 5: Stand by assistance Extremity/Trunk Assessment RUE Assessment RUE Assessment: Exceptions to [facility] RUE Strength RUE Overall Strength Comments: slight tone noted elbow/wrist/hand LUE Assessment LUE Assessment: Within Functional Limits  See Function Navigator for Current Functional Status.  Daneen Schick Kaytelyn Glore 06/15/2016, 4:41 PM

## 2016-06-15 NOTE — Progress Notes (Signed)
Physical Therapy Discharge Summary  Patient Details  Name: Paige Stewart MRN: [id number] Date of Birth: 12-Dec-1950  Today's Date: 06/15/2016 PT Individual Time: 1300-1415 PT Individual Time Calculation (min): 75 min    Patient has met 7 of 7 long term goals due to improved activity tolerance, improved balance, improved postural control, increased strength, improved attention, improved awareness and improved coordination.  Patient to discharge at a wheelchair level min to mod assist.   Patient's care partner is independent to provide the necessary physical assistance at discharge.  Recommendation:  Patient will benefit from ongoing skilled PT services in home health setting to continue to advance safe functional mobility, address ongoing impairments in balance, coordination, strength, motor control, perception, and cognition, and minimize fall risk.  Equipment: 16x16 ultra-hemi-height w/c with basic cushion, slide board (24")  Reasons for discharge: discharge from hospital  Patient/family agrees with progress made and goals achieved: Yes   Skilled Therapeutic Intervention: No c/o pain.  Session focus on d/c assessment, transfer training, w/c propulsion, and ambulation for NMR.   Pt transitions supine>sit with min assist for RLE.  Stand/pivot to w/c (attempted to use slide board but pt unable/unwilling to scoot) with mod assist and pt propelled w/c to therapy gym with min assist tactile cues at RLE.  Pt requires constant tactile cues at RLE to incorporate into hemi-technique for w/c propulsion.  Transfer training to and from therapy mat x2 with slide board and overall min assist but max multimodal cues to sequence transfer and for safe use of slide board.  Gait training with rail in hallway for NMR and postural control with mod assist to advance/place/stabilize RLE, +2 w/c follow for safety.  Pt negotiated 1 step with L rail and total assist.  Pt returned to room at end of session and  positioned back to bed with call bell in reach and needs met.   PT Discharge Precautions/Restrictions Precautions Precautions: Fall Precaution Comments: dense R hemiparesis Restrictions Weight Bearing Restrictions: No  Cognition Overall Cognitive Status: Impaired/Different from baseline Arousal/Alertness: Awake/alert Orientation Level: Other (comment) Pt not able to state her birthday (first states January, then perseverates on April) or today's date (perseverative on October, 1926), and required therapist to change task and return to orientation re: place and situation to redirect.  Pt unable to articulate orientation to situation.  Attention: Selective Focused Attention: Appears intact Sustained Attention: Appears intact Selective Attention: Impaired Memory: Impaired Memory Impairment: Decreased recall of new information;Storage deficit Problem Solving: Impaired Safety/Judgment: Impaired Sensation Sensation Light Touch: Appears Intact Coordination Gross Motor Movements are Fluid and Coordinated: No Fine Motor Movements are Fluid and Coordinated: No Coordination and Movement Description: dense R hemiparesis limiting all mobility and function Motor  Motor Motor: Hemiplegia;Abnormal postural alignment and control Motor - Discharge Observations: ongoing R hemiparesis (dense), improving postural control  Mobility Bed Mobility Bed Mobility: Supine to Sit Supine to Sit: HOB elevated;With rails;4: Min assist Supine to Sit Details (indicate cue type and reason): min assist to bring RLE to the EOB Transfers Transfers: Yes Sit to Stand: 3: Mod assist Sit to Stand Details: Verbal cues for technique;Verbal cues for precautions/safety;Verbal cues for safe use of DME/AE;Manual facilitation for weight shifting Stand to Sit: 4: Min guard Squat Pivot Transfers: 3: Mod assist Lateral/Scoot Transfers: 4: Min assist;With slide board Lateral/Scoot Transfer Details: Tactile cues for weight  shifting;Tactile cues for initiation;Tactile cues for posture;Verbal cues for sequencing;Verbal cues for safe use of DME/AE;Verbal cues for precautions/safety Lateral/Scoot Transfer Details (indicate cue type and reason):  max multimodal cues for sequencing and safe use of slide board Locomotion  Ambulation Ambulation: Yes Ambulation/Gait Assistance: 1: +2 Total assist (mod assist for gait, +2 for w/c follow for safety) Ambulation Distance (Feet): 30 Feet Assistive device:  (rail in hallway) Ambulation/Gait Assistance Details: manual facilitation to advance/place/stabilize RLE Stairs / Additional Locomotion Stairs: Yes Stairs Assistance: 1: +1 Total assist Stair Management Technique: One rail Left Number of Stairs: 1 Height of Stairs: 3 Wheelchair Mobility Wheelchair Mobility: Yes Wheelchair Assistance: 4: Advertising account executive Details: Tactile cues for placement Wheelchair Propulsion: Left lower extremity;Left upper extremity Wheelchair Parts Management: Needs assistance Distance: 150  Trunk/Postural Assessment  Cervical Assessment Cervical Assessment: Within Functional Limits Thoracic Assessment Thoracic Assessment: Within Functional Limits Lumbar Assessment Lumbar Assessment: Within Functional Limits Postural Control Postural Control: Deficits on evaluation Righting Reactions: delayed to R, improving to L Protective Responses: delayed to R, improving to L Postural Limitations: decreased  Balance Balance Balance Assessed: Yes Static Sitting Balance Static Sitting - Balance Support: Left upper extremity supported Static Sitting - Level of Assistance: 5: Stand by assistance Dynamic Sitting Balance Dynamic Sitting - Balance Support: No upper extremity supported Dynamic Sitting - Level of Assistance: 4: Min assist Extremity Assessment      RLE Assessment RLE Assessment: Exceptions to [facility] RLE AROM (degrees) RLE Overall AROM Comments: limited 2/2 weakness RLE  PROM (degrees) RLE Overall PROM Comments: WFL RLE Strength RLE Overall Strength Comments: trace hip extension and knee extension LLE Assessment LLE Assessment: Within Functional Limits LLE AROM (degrees) LLE Overall AROM Comments: WFL assessed in sitting LLE Strength Left Hip Flexion: 4/5 Left Knee Flexion: 4/5 Left Knee Extension: 4/5 Left Ankle Dorsiflexion: 4/5 Left Ankle Plantar Flexion: 4/5   See Function Navigator for Current Functional Status.  Abdo Denault E Penven-Crew 06/15/2016, 2:01 PM

## 2016-06-16 DIAGNOSIS — I701 Atherosclerosis of renal artery: Secondary | ICD-10-CM

## 2016-06-16 LAB — GLUCOSE, CAPILLARY
GLUCOSE-CAPILLARY: 185 mg/dL — AB (ref 65–99)
Glucose-Capillary: 183 mg/dL — ABNORMAL HIGH (ref 65–99)
Glucose-Capillary: 248 mg/dL — ABNORMAL HIGH (ref 65–99)

## 2016-06-16 MED ORDER — GLIMEPIRIDE 4 MG PO TABS: 4.0000 mg | ORAL_TABLET | Freq: Every day | ORAL | 0 refills | Status: DC

## 2016-06-16 MED ORDER — GABAPENTIN 300 MG PO CAPS: 300.0000 mg | ORAL_CAPSULE | Freq: Two times a day (BID) | ORAL | 0 refills | Status: DC

## 2016-06-16 MED ORDER — METOPROLOL SUCCINATE ER 50 MG PO TB24: ORAL_TABLET | ORAL | 0 refills | Status: DC

## 2016-06-16 MED ORDER — ATORVASTATIN CALCIUM 40 MG PO TABS: 40.0000 mg | ORAL_TABLET | Freq: Every day | ORAL | 0 refills | Status: DC

## 2016-06-16 MED ORDER — FLUOXETINE HCL 10 MG PO CAPS: 10.0000 mg | ORAL_CAPSULE | Freq: Every day | ORAL | 0 refills | Status: DC

## 2016-06-16 MED ORDER — MELATONIN 3 MG PO TABS: 3.0000 mg | ORAL_TABLET | Freq: Every day | ORAL | 0 refills | Status: DC

## 2016-06-16 MED ORDER — CLOPIDOGREL BISULFATE 75 MG PO TABS: 75.0000 mg | ORAL_TABLET | Freq: Every day | ORAL | 0 refills | Status: DC

## 2016-06-16 NOTE — Progress Notes (Signed)
Social Work Discharge Note  The overall goal for the admission was met for:   Discharge location: Yes-HOME WITH DAUGHTER AND SON IN-[place]-24 HR CARE  Length of Stay: Yes-22 DAYS  Discharge activity level: Yes-MIN-MOD LEVEL  Home/community participation: Yes  Services provided included: MD, RD, PT, OT, SLP, RN, CM, TR, Pharmacy and SW  Financial Services: Private Insurance: [employer]  Follow-up services arranged: Home Health: [facility]-PT,OT,SP [place] BED, [facility] AND BEDSIDE COMMODE  Comments (or additional information):BOTH DAUGHTER AND SON [place] WERE HERE TO GO THROUGH THERAPIES WITH PT AND FEEL COMFORTABLE WITH HER CARE. PLAN TO HIRE ASSIST WHILE DAUGHTER WORKS. DAUGHTER TO PURSUE MEDICAL CLINIC FOLLOW UP AND WILL MAKE SURE PT HAS THE CARE SHE NEEDS. POSSIBLE PLAN TO GO BACK HOME ONCE MEDICALLY STABLE TO FLY. AMBULANCE TRANSPORT HOME.  Patient/Family verbalized understanding of follow-up arrangements: Yes  Individual responsible for coordination of the follow-up plan: [facility]  Confirmed correct DME delivered: Elease Hashimoto 06/16/2016    Elease Hashimoto

## 2016-06-16 NOTE — Discharge Instructions (Signed)
Inpatient Rehab Discharge Instructions  Paige Stewart Discharge date and time:  06/16/16  Activities/Precautions/ Functional Status: Activity: no lifting, driving, or strenuous exercise for till cleared by MD. Diet: cardiac diet and diabetic diet Wound Care: none needed   Functional status:  ___ No restrictions     ___ Walk up steps independently _X__ 24/7 supervision/assistance   ___ Walk up steps with assistance ___ Intermittent supervision/assistance  ___ Bathe/dress independently ___ Walk with walker     _X__ Bathe/dress with assistance ___ Walk Independently    ___ Shower independently _X__ Walk with assistance    ___ Shower with assistance _X__ No alcohol     ___ Return to work/school ________  Special Instructions: 1. Check blood sugars 1-2 times a day and record 2. Drink plenty of fluids.  COMMUNITY REFERRALS UPON DISCHARGE:    Home Health:   PT, OT, SP  Agency:[facility] Phone:[phone]   Date of last service:06/16/2016  Medical Equipment/Items Ordered:HOSPITAL BED, WHEELCHAIR, WIDE DROP-ARM BEDSIDE COMMODE, TUB BENCH 24 TRANSFER BOARD  Agency/Supplier:[facility]   [phone] Other:DAUGHTER TO HIRE ASSIST FOR MOM WHILE SHE WORKS  GENERAL COMMUNITY RESOURCES FOR PATIENT/FAMILY: Support Groups:[facility] SUPPORT GROUP EVERY SECOND Thursday @ 3:00-4:00 PM ON THE REHAB UNIT QUESTIONS CONTACT [place] [phone]  STROKE/TIA DISCHARGE INSTRUCTIONS SMOKING Cigarette smoking nearly doubles your risk of having a stroke & is the single most alterable risk factor  If you smoke or have smoked in the last 12 months, you are advised to quit smoking for your health.  Most of the excess cardiovascular risk related to smoking disappears within a year of stopping.  Ask you doctor about anti-smoking medications  [place] Quit Line: 1-800-QUIT NOW  Free Smoking Cessation Classes [phone]  CHOLESTEROL Know your levels; limit fat & cholesterol in your diet  Lipid  Panel     Component Value Date/Time   CHOL 168 05/22/2016 0006   TRIG 81 05/22/2016 0006   HDL 34 (L) 05/22/2016 0006   CHOLHDL 4.9 05/22/2016 0006   VLDL 16 05/22/2016 0006   LDLCALC 118 (H) 05/22/2016 0006      Many patients benefit from treatment even if their cholesterol is at goal.  Goal: Total Cholesterol (CHOL) less than 160  Goal:  Triglycerides (TRIG) less than 150  Goal:  HDL greater than 40  Goal:  LDL (LDLCALC) less than 100   BLOOD PRESSURE American Stroke Association blood pressure target is less that 120/80 mm/Hg  Your discharge blood pressure is:  BP: 119/75  Monitor your blood pressure  Limit your salt and alcohol intake  Many individuals will require more than one medication for high blood pressure  DIABETES (A1c is a blood sugar average for last 3 months) Goal HGBA1c is under 7% (HBGA1c is blood sugar average for last 3 months)  Diabetes:     Lab Results  Component Value Date   HGBA1C 10.3 (H) 05/22/2016     Your HGBA1c can be lowered with medications, healthy diet, and exercise.  Check your blood sugar as directed by your physician  Call your physician if you experience unexplained or low blood sugars.  PHYSICAL ACTIVITY/REHABILITATION Goal is 30 minutes at least 4 days per week  Activity: No driving, Therapies: see above Return to work: N/A  Activity decreases your risk of heart attack and stroke and makes your heart stronger.  It helps control your weight and blood pressure; helps you relax and can improve your mood.  Participate in a regular exercise program.  Talk with your doctor about the best form of exercise for you (dancing, walking, swimming, cycling).  DIET/WEIGHT Goal is to maintain a healthy weight  Your discharge diet is: DIET DYS 3 Room service appropriate? Yes; Fluid consistency: Thin liquids Your height is:  Height:  (149.9 cm) Your current weight is: Weight: 66.8 kg (147 lb 4.3 oz) Your Body Mass Index (BMI) is:  BMI  (Calculated): 30.3  Following the type of diet specifically designed for you will help prevent another stroke.  Your goal weight is:  124 lbs  Your goal Body Mass Index (BMI) is 19-24.  Healthy food habits can help reduce 3 risk factors for stroke:  High cholesterol, hypertension, and excess weight.  RESOURCES Stroke/Support Group:  Call [phone]   STROKE EDUCATION PROVIDED/REVIEWED AND GIVEN TO PATIENT Stroke warning signs and symptoms How to activate emergency medical system (call 911). Medications prescribed at discharge. Need for follow-up after discharge. Personal risk factors for stroke. Pneumonia vaccine given:  Flu vaccine given:  My questions have been answered, the writing is legible, and I understand these instructions.  I will adhere to these goals & educational materials that have been provided to me after my discharge from the hospital.     My questions have been answered and I understand these instructions. I will adhere to these goals and the provided educational materials after my discharge from the hospital.  Patient/Caregiver Signature _______________________________ Date __________  Clinician Signature _______________________________________ Date __________  Please bring this form and your medication list with you to all your follow-up doctor's appointments.

## 2016-06-16 NOTE — Progress Notes (Signed)
Speech Language Pathology Discharge Summary  Patient Details  Name: Paige Stewart MRN: [id number] Date of Birth: 01-09-1951  Patient has met 8 of 8 long term goals.  Patient to discharge at overall Mod;Max level.   Reasons goals not met: N/A   Clinical Impression/Discharge Summary: Patient has made functional gains and has met 8 of 8 LTG's this admission. Currently, patient is consuming Dys. 3 textures with thin liquids with minimal overt s/s of aspiration and requires Min A verbal cues for use of swallowing compensatory strategies. Patient demonstrates increased vocal intensity to achieve ~75% intelligibility at the phrase and sentence level. Patient also demonstrates improved word-finding which increased her ability to express her basic wants/needs and maximize overall functional communication. Patient continues to require overall Mod-Max A multimodal cues to complete functional and familiar tasks safely in regards to problem solving, recall of information and awareness. Patient and family education is complete and patient will discharge home with 24 hour supervision from family. Patient would benefit from f/u home health SLP services to maximize her cognitive and swallowing function and functional communication in order to maximize her overall functional independence and reduce caregiver burden.    Care Partner:  Caregiver Able to Provide Assistance: Yes  Type of Caregiver Assistance: Physical;Cognitive  Recommendation:  Home Health SLP;24 hour supervision/assistance  Rationale for SLP Follow Up: Maximize functional communication;Maximize cognitive function and independence;Maximize swallowing safety;Reduce caregiver burden   Equipment: N/A   Reasons for discharge: Discharged from hospital;Treatment goals met   Patient/Family Agrees with Progress Made and Goals Achieved: Yes    Paige Stewart, [place] 06/16/2016, 7:10 AM

## 2016-06-16 NOTE — Progress Notes (Signed)
Patient and family received discharge instructions from Marissa Nestle, PA-C with verbal understanding. Patient transported home via EMS. Family waiting at home for patient to arrive.

## 2016-06-16 NOTE — Discharge Summary (Signed)
Physician Discharge Summary  Patient ID: Paige Stewart MRN: [id number] DOB/AGE: Jul 25, 1950 66 y.o.  Admit date: 05/25/2016 Discharge date: 06/16/2016  Discharge Diagnoses:  Principal Problem:   Basal ganglia infarction [facility]) Active Problems:   Right hemiplegia (HCC)   Benign essential HTN   E. 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 FINDINGS: Cardiac shadow is mildly enlarged. No vascular congestion is seen. The lungs are well-aerated without focal infiltrate or sizable effusion. No bony abnormality is noted. IMPRESSION: No active cardiopulmonary disease. Electronically Signed   By: Alcide Clever M.D.   On: 05/29/2016 16:31     Labs:  Basic Metabolic Panel: BMP Latest Ref Rng & Units 06/13/2016 06/09/2016 06/06/2016  Glucose 65 - 99 mg/dL 119(J) 478(G) 956(O)  BUN 6 - 20 mg/dL Creatinine 0.44 - 1.00 mg/dL 1.30 8.65 7.84  Sodium 135 - 145 mmol/L 138 138 135  Potassium 3.5 - 5.1 mmol/L 4.2 3.9 4.7  Chloride 101 - 111 mmol/L 106 106 104  CO2 22 - 32 mmol/L Calcium 8.9 - 10.3 mg/dL 9.0 9.3 9.3    CBC: CBC Latest Ref Rng & Units 06/13/2016 05/30/2016 05/26/2016  WBC 4.0 - 10.5 K/uL 3.6(L) 4.7 5.1  Hemoglobin 12.0 - 15.0 g/dL 11.5(L) 11.5(L) 11.7(L)  Hematocrit 36.0 - 46.0 % 33.6(L) 34.5(L) 33.6(L)  Platelets 150 - 400 K/uL 234 300 256    CBG:  Recent Labs Lab 06/15/16 1749 06/15/16 2122 06/16/16 0600 06/16/16 1245 06/16/16 1619  GLUCAP 213* 370* 185* 183* 248*    Today's Vitals   06/16/16 0729 06/16/16 0908 06/16/16 1025 06/16/16 1500  BP:  109/64 119/75 128/64   Pulse:  82 79 79  Resp:   17 17  Temp:   98.8 F (37.1 C) 98.8 F (37.1 C)  TempSrc:   Oral Oral  SpO2:   99% 99%  Weight:      Height:      PainSc: 0-No pain       Brief HPI:   Paige Stewart a 66 y.o.RH-female with history of DM with neuropathy, HTN--medication noncompliance, who was found down by family on 05/21/16 with right sided weakness. EKG with ST changes and positive cardiac enzymes. BP elevated at 193/1120 CT perfusion/CTA head/neck revealed hypoperfusion of left frontotemporal lobe and left frontal operculum, hypoperfusion of anterior branch L-MCA with severe stenosis Left M3 branch. CTA chest/pelvis was negative for acute aortic syndrome with incidental finding of moderate stenosis proximal R-RA. 2 D echo showed EF 40-45% with severe hypokinesis of mid apical anteroseptal and anterior myocardium c/w with ischemia in distribution of LAD. Cardiology consulted for input and recommended outpatient work up for demand ischemia. TEE was negative for intracardiac embolic source. Dr. Pearlean Brownie recommended Plavix for stroke due to unknown source with recommendations for outpatient NUC in a few months if still in the [place].  ESBL  UTI was treated with  Bactrim.   Therapy initiated and patient limited by lethargy, right sided weakness, left gaze preference, expressive/receptive aphasia, dysphagia. CIR recommended for follow up therapy.    Hospital Course: Paige Stewart was admitted to rehab 05/25/2016  for inpatient therapies to consist of PT, ST and OT at least three hours five days a week. Past admission physiatrist, therapy team and rehab RN have worked together to provide customized collaborative inpatient rehab.  Blood pressures were monitored on bid basis with HCTZ on board initially. She had poor po intake of puree, nectar liquids diet and developed dehydration with hypotension and orthostasis. She was treated with fluid boluses and started on IVF at nights till liquids were advanced to  thin. Pre-renal azotemia has resolved and IVF were discontinued. Her family has assisted with traditional food items from home to help with appetite. HCTZ was discontinued and BP have been controlled off medications.   Diabetes has been monitored on ac/hs basis and metformin was increased to 1000 mg bid to help with better BS control. She developed explosive diarrhea therefore this was discontinued and Amaryl was added and titrated upwards to 4 mg daily. Family to continue to check BS bid and follow up with primary MD for further titration after discharge. She completed 7 day course of Septra for treatment for E coli.she has had microhematuria that has resolved. Follow up UA 4/23 was negative with culture positive for enterococcus. Patient asymptomatic without fever or leucocytosis and has been monitored along. family encouraged to continue to offer fluids frequently after discharge.  She was started on Prozac for depressed mood and ego support has been provided by the team. Insomnia has been managed with use of melatonin at bedtime.  She has made steady progress but continues to be limited by physical and cognitive deficits. She has progressed to min to moderate assist with ADL and mobility.  [facility] to provide HHPT, HHST and HHOT after discharge. .    Rehab course: During patient's stay in rehab weekly team conferences were held to monitor patient's progress, set goals and discuss barriers to discharge. At admission, patient required total assist with basic self care tasks and +2 total assist with mobility. She demonstrated moderate to severe cognitive impairments with decrease attention to the right and severely impaired speech. She demonstrated dysphagia with delay in swallow and impulsivity requiring dysphagia 1, nectar liquids.  She has had improvement in activity tolerance, balance, postural control, as well as ability to compensate for deficits. She is has had improvement in functional use  LUE  and LLE as well as improved awareness.   She is able to completed ADL task with mod assist. She is able to perform transfers with min assist and max cues for sliding board transfers. She is able to ambulate 30' with +2 mod assist and tactile cues to initiated LLE. Her diet has been advanced to dysphagia 3, thin liquids and she requires min cues for use of safe swallow strategies. She has had improvement in voice volume with 75% intelligibility at phrase/sentence level. She is showing improvement in ability to express basic wants and needs with decrease in word finding deficits. She continues to require mod to max multimodal cues to complete basic and familiar tasks, recall and for awareness of deficits. Family education was completed with daughter and son in law.     Disposition: 01-Home or Self Care  Diet: Heart Healthy/Diabetic diet.   Special Instructions: 1. Check blood sugars twice a day and record. 2. Continue HEP and set a daily routine after discharge. 3. Can follow up with [facility] for primary medical care.   Discharge Instructions    Ambulatory referral to Physical Medicine Rehab    Complete by:  As directed    1-2 weeks transitional care appt     Allergies as of 06/16/2016      Reactions   Metformin And Related    Diarrhea with higher doses      Medication List    STOP taking these medications   food thickener Powd Commonly known as:  THICK IT   hydrochlorothiazide 25 MG tablet Commonly known as:  HYDRODIURIL   ibuprofen 200 MG tablet Commonly known as:  ADVIL,MOTRIN   insulin NPH Human 100 UNIT/ML injection Commonly known as:  HUMULIN N,NOVOLIN N   lisinopril 20 MG tablet Commonly known as:  PRINIVIL,ZESTRIL   metFORMIN 1000 MG tablet Commonly known as:  GLUCOPHAGE   sulfamethoxazole-trimethoprim 800-160 MG tablet Commonly known as:  BACTRIM DS,SEPTRA DS     TAKE these medications   aspirin 81 MG chewable tablet Chew 1 tablet (81 mg  total) by mouth daily.   atorvastatin 40 MG tablet Commonly known as:  LIPITOR Take 1 tablet (40 mg total) by mouth daily at 6 PM.   clopidogrel 75 MG tablet Commonly known as:  PLAVIX Take 1 tablet (75 mg total) by mouth daily.   FLUoxetine 10 MG capsule Commonly known as:  PROZAC Take 1 capsule (10 mg total) by mouth daily.   gabapentin 300 MG capsule Commonly known as:  NEURONTIN Take 1 capsule (300 mg total) by mouth 2 (two) times daily.   glimepiride 4 MG tablet Commonly known as:  AMARYL Take 1 tablet (4 mg total) by mouth daily with breakfast.   Melatonin 3 MG Tabs Take 1 tablet (3 mg total) by mouth at bedtime.   metoprolol succinate 50 MG 24 hr tablet Commonly known as:  TOPROL XL Take 1 1/2 pill daily with breakfast. What changed:  how much to take  how to take this  when to take this  additional instructions   multivitamin tablet Take 1 tablet by mouth daily.      Follow-up Information    Erick Colace, MD Follow up.   Specialty:  Physical Medicine and Rehabilitation Why:  office will call you with follow up appointment Contact information: [address] [place] [address] [phone]        Delia Heady, MD. Call in 1 day(s).   Specialties:  Neurology, Radiology Why:  for follow up in 4-6 weeks.  Contact information: [address] [place] [address] [phone]        [facility] Follow up.   Why:  Call for work up of cardiomyopathy/question hearrt disease.  Contact information: [address] [place] [address]          Signed: Jacquelynn Cree 06/16/2016, 5:11 PM

## 2016-06-16 NOTE — Progress Notes (Signed)
Subjective/Complaints: No new problems , aware of D/C    Objective: Vital Signs: Blood pressure 112/60, pulse 81, temperature 98.1 F (36.7 C), temperature source Oral, resp. rate 17, height  (1.499 m), weight 66.8 kg (147 lb 4.3 oz), SpO2 99 %. No results found. Results for orders placed or performed during the hospital encounter of 05/25/16 (from the past 72 hour(s))  Glucose, capillary     Status: Abnormal   Collection Time: 06/13/16 12:56 PM  Result Value Ref Range   Glucose-Capillary 132 (H) 65 - 99 mg/dL  Glucose, capillary     Status: Abnormal   Collection Time: 06/13/16  5:22 PM  Result Value Ref Range   Glucose-Capillary 118 (H) 65 - 99 mg/dL  Glucose, capillary     Status: Abnormal   Collection Time: 06/13/16 10:02 PM  Result Value Ref Range   Glucose-Capillary 259 (H) 65 - 99 mg/dL  Glucose, capillary     Status: Abnormal   Collection Time: 06/14/16  6:45 AM  Result Value Ref Range   Glucose-Capillary 170 (H) 65 - 99 mg/dL  Glucose, capillary     Status: Abnormal   Collection Time: 06/14/16 11:49 AM  Result Value Ref Range   Glucose-Capillary 143 (H) 65 - 99 mg/dL  Glucose, capillary     Status: Abnormal   Collection Time: 06/14/16  5:17 PM  Result Value Ref Range   Glucose-Capillary 183 (H) 65 - 99 mg/dL  Glucose, capillary     Status: Abnormal   Collection Time: 06/14/16  9:37 PM  Result Value Ref Range   Glucose-Capillary 289 (H) 65 - 99 mg/dL   Comment 1 Notify RN    Comment 2 Document in Chart   Glucose, capillary     Status: Abnormal   Collection Time: 06/15/16  6:51 AM  Result Value Ref Range   Glucose-Capillary 186 (H) 65 - 99 mg/dL  Glucose, capillary     Status: Abnormal   Collection Time: 06/15/16 12:06 PM  Result Value Ref Range   Glucose-Capillary 187 (H) 65 - 99 mg/dL  Glucose, capillary     Status: Abnormal   Collection Time: 06/15/16  5:49 PM  Result Value Ref Range   Glucose-Capillary 213 (H) 65 - 99 mg/dL  Glucose, capillary      Status: Abnormal   Collection Time: 06/15/16  9:22 PM  Result Value Ref Range   Glucose-Capillary 370 (H) 65 - 99 mg/dL  Glucose, capillary     Status: Abnormal   Collection Time: 06/16/16  6:00 AM  Result Value Ref Range   Glucose-Capillary 185 (H) 65 - 99 mg/dL     HEENT: Normocephalic, atraumatic Cardio:RRR. Resp: CTA bilaterally. Normal effort GI: BS positive and ND Skin:   Intact. Warm and dry. Neuro: Flat, word finding deficits.  Motor 0 /5 in RUE and 2- hip/knee ext synergy RLE  Tone MAS 2 at elbow Musc/Skel:  No edema. No tenderness.no pain with ROM RIght wrist , no effusion or erythema, mild medial R elbow pain  with ROM, no effusion  Gen NAD. Vital signs reviewed.   Assessment/Plan: 1. Functional deficits secondary to Basal ganglia infarct  Stable for D/C today F/u PCP in 3-4 weeks F/u PM&R 2 weeks See D/C summary See D/C instructions FIM: Function - Bathing Bathing activity did not occur: N/A Position: Shower Body parts bathed by patient: Chest, Abdomen, Right upper leg, Left upper leg, Left lower leg, Right arm Body parts bathed by helper: Right arm, Chest, Abdomen,  Front perineal area, Right upper leg, Left upper leg, Left lower leg Bathing not applicable: Left arm, Right lower leg, Back Assist Level: Touching or steadying assistance(Pt > 75%)  Function- Upper Body Dressing/Undressing Upper body dressing/undressing activity did not occur: N/A What is the patient wearing?: Pull over shirt/dress Pull over shirt/dress - Perfomed by patient: Thread/unthread right sleeve, Thread/unthread left sleeve, Put head through opening, Pull shirt over trunk Pull over shirt/dress - Perfomed by helper: Pull shirt over trunk Button up shirt - Perfomed by patient: Thread/unthread right sleeve Button up shirt - Perfomed by helper: Button/unbutton shirt Assist Level: Set up, Supervision or verbal cues, More than reasonable time Set up : To obtain clothing/put away Function -  Lower Body Dressing/Undressing Lower body dressing/undressing activity did not occur: N/A What is the patient wearing?: Pants, Non-skid slipper socks Position: Sitting EOB (and in bed) Underwear - Performed by helper: Pull underwear up/down Pants- Performed by patient: Thread/unthread right pants leg, Thread/unthread left pants leg Pants- Performed by helper: Pull pants up/down Non-skid slipper socks- Performed by patient: Don/doff right sock, Don/doff left sock Non-skid slipper socks- Performed by helper: Don/doff left sock Socks - Performed by patient: Don/doff left sock Socks - Performed by helper: Don/doff right sock Assist for footwear: Supervision/touching assist Assist for lower body dressing: Touching or steadying assistance (Pt > 75%)  Function - Toileting Toileting activity did not occur: No continent bowel/bladder event Toileting steps completed by patient: Performs perineal hygiene Toileting steps completed by helper: Adjust clothing prior to toileting, Adjust clothing after toileting Toileting Assistive Devices: Grab bar or rail Assist level: Touching or steadying assistance (Pt.75%)  Function - Archivist transfer activity did not occur:  (not attempted) Toilet transfer assistive device: Bedside commode, Sliding board Mechanical lift: Stedy Assist level to toilet: Touching or steadying assistance (Pt > 75%) Assist level from toilet: Touching or steadying assistance (Pt > 75%) Assist level to bedside commode (at bedside): Touching or steadying assistance (Pt > 75%) Assist level from bedside commode (at bedside): Touching or steadying assistance (Pt > 75%)  Function - Chair/bed transfer Chair/bed transfer method: Lateral scoot, Squat pivot Chair/bed transfer assist level: Moderate assist (Pt 50 - 74%/lift or lower) Chair/bed transfer assistive device: Armrests, Sliding board Chair/bed transfer details: Tactile cues for weight shifting, Tactile cues for  placement, Visual cues for safe use of DME/AE  Function - Locomotion: Wheelchair Will patient use wheelchair at discharge?: Yes Type: Manual Max wheelchair distance: 150 Assist Level: Touching or steadying assistance (Pt > 75%) Assist Level: Touching or steadying assistance (Pt > 75%) Assist Level: Touching or steadying assistance (Pt > 75%) Turns around,maneuvers to table,bed, and toilet,negotiates 3% grade,maneuvers on rugs and over doorsills: No Function - Locomotion: Ambulation Assistive device: Rail in hallway Max distance: 30 Assist level: 2 helpers Assist level: 2 helpers Walk 50 feet with 2 turns activity did not occur: Safety/medical concerns Walk 150 feet activity did not occur: Safety/medical concerns Walk 10 feet on uneven surfaces activity did not occur: Safety/medical concerns  Function - Comprehension Comprehension: Auditory Comprehension assist level: Understands basic 50 - 74% of the time/ requires cueing 25 - 49% of the time  Function - Expression Expression: Verbal Expression assist level: Expresses basic 50 - 74% of the time/requires cueing 25 - 49% of the time. Needs to repeat parts of sentences.  Function - Social Interaction Social Interaction assist level: Interacts appropriately 50 - 74% of the time - May be physically or verbally inappropriate.  Function - Problem  Solving Problem solving assist level: Solves basic 25 - 49% of the time - needs direction more than half the time to initiate, plan or complete simple activities  Function - Memory Memory assist level: Recognizes or recalls less than 25% of the time/requires cueing greater than 75% of the time Patient normally able to recall (first 3 days only): That he or she is in a hospital  Medical Problem List and Plan: 1.  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  DVT Prophylaxis/Anticoagulation: Pharmaceutical: Lovenox,    -Vascular study with small  right baker's cyst, otherwise unremarkable 3. Pain Management: Added Sportscreme for back pain.  4. Mood: LCSW to follow for evaluation and support  -paxil trial for depression.  5. Neuropsych: This patient is not capable of making decisions on her own behalf. Ask neuropsych to eval, poor initiation difficulty following PT instructions, ? Apraxia , post stroke depression 6. Skin/Wound Care: pressure relief measures. 7. Fluids/Electrolytes/Nutrition: Monitor I/O.  8. HTN: monitor BP bid--continue HCTZ.   Variable systolic or diastolic HTN ,off hctz  toprol dose to  Vitals:   06/15/16 1432 06/16/16 0511  BP: 112/60   Pulse: 81   Resp: 18 17  Temp: 98.7 F (37.1 C) 98.1 F (36.7 C)    9. Hypokalemia: resolved on  supplement.  K+ 4.2 on 4/16  11. NSTEMI: Will need follow up NUC study in the future.  12. T2DM with peripheral neuropathy: Monitor BS ac/hs. Hgb A1c- 10.3- .       -increase amaryl to ,Increase to   , diarrhea may be related to  metformin resolved, pt off metformin (last 3)   Recent Labs  06/15/16 1749 06/15/16 2122 06/16/16 0600  GLUCAP 213* 370* 185*   13.  ARF- prerenal azotemia  cont .45 NS qhs, EF 45% no signs overload, weights stable around 63kg  -follow up labs normal, poor fluid intake  IVF at noc go back to 35ml/hr , recorded po fluids only 4/18 , BMET nl 4/24 14.  Hematuria- improved still occ, likely catheter related plus enoxaparin,plavix and ASA 15.  Loose stool resolved off metformin and  fibercon  LOS (Days) 22 A FACE TO FACE EVALUATION WAS PERFORMED  Konstantine Gervasi E 06/16/2016, 7:38 AM

## 2016-06-30 ENCOUNTER — Ambulatory Visit (HOSPITAL_BASED_OUTPATIENT_CLINIC_OR_DEPARTMENT_OTHER): Payer: No Typology Code available for payment source | Admitting: Physical Medicine & Rehabilitation

## 2016-06-30 ENCOUNTER — Encounter: Payer: No Typology Code available for payment source | Attending: Physical Medicine & Rehabilitation

## 2016-06-30 ENCOUNTER — Encounter: Payer: Self-pay | Admitting: Physical Medicine & Rehabilitation

## 2016-06-30 VITALS — BP 169/102 | HR 79

## 2016-06-30 DIAGNOSIS — E785 Hyperlipidemia, unspecified: Secondary | ICD-10-CM | POA: Insufficient documentation

## 2016-06-30 DIAGNOSIS — E1142 Type 2 diabetes mellitus with diabetic polyneuropathy: Secondary | ICD-10-CM

## 2016-06-30 DIAGNOSIS — R269 Unspecified abnormalities of gait and mobility: Secondary | ICD-10-CM

## 2016-06-30 DIAGNOSIS — I69351 Hemiplegia and hemiparesis following cerebral infarction affecting right dominant side: Secondary | ICD-10-CM

## 2016-06-30 DIAGNOSIS — I69398 Other sequelae of cerebral infarction: Secondary | ICD-10-CM | POA: Diagnosis not present

## 2016-06-30 DIAGNOSIS — Z79899 Other long term (current) drug therapy: Secondary | ICD-10-CM | POA: Insufficient documentation

## 2016-06-30 DIAGNOSIS — Z9114 Patient's other noncompliance with medication regimen: Secondary | ICD-10-CM | POA: Insufficient documentation

## 2016-06-30 DIAGNOSIS — I10 Essential (primary) hypertension: Secondary | ICD-10-CM

## 2016-06-30 MED ORDER — CLOPIDOGREL BISULFATE 75 MG PO TABS: 75.0000 mg | ORAL_TABLET | Freq: Every day | ORAL | 0 refills | Status: AC

## 2016-06-30 MED ORDER — GLIMEPIRIDE 4 MG PO TABS
4.0000 mg | ORAL_TABLET | Freq: Every day | ORAL | 0 refills | Status: AC
Start: 2016-06-30 — End: ?

## 2016-06-30 MED ORDER — FLUOXETINE HCL 10 MG PO CAPS: 10.0000 mg | ORAL_CAPSULE | Freq: Every day | ORAL | 0 refills | Status: AC

## 2016-06-30 MED ORDER — MELATONIN 3 MG PO TABS: 3.0000 mg | ORAL_TABLET | Freq: Every day | ORAL | 0 refills | Status: AC

## 2016-06-30 MED ORDER — GABAPENTIN 300 MG PO CAPS: 300.0000 mg | ORAL_CAPSULE | Freq: Two times a day (BID) | ORAL | 0 refills | Status: AC

## 2016-06-30 MED ORDER — ATORVASTATIN CALCIUM 40 MG PO TABS: 40.0000 mg | ORAL_TABLET | Freq: Every day | ORAL | 1 refills | Status: AC

## 2016-06-30 MED ORDER — METOPROLOL SUCCINATE ER 50 MG PO TB24: ORAL_TABLET | ORAL | 0 refills | Status: AC

## 2016-06-30 NOTE — Progress Notes (Signed)
Subjective:    Patient ID: Paige Stewart, female    DOB: Apr 29, 1950, 66 y.o.   MRN: [id number] 66 y.o. RH-female with history of DM with neuropathy, HTN--medication noncompliance, who was found down by family on 05/21/16 with right sided weakness. EKG with ST changes and positive cardiac enzymes. 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  CTA chest/pelvis was negative for acute aortic syndrome with incidental finding of moderate stenosis proximal R-RA.   2 D echo showed EF 40-45% with severe hypokinesis of mid apical anteroseptal and anterior myocardium c/w with ischemia in distribution of LAD. Cardiology consulted for input and recommended outpatient work up for demand ischemia.  TEE was negative for intracardiac embolic source. Dr. Pearlean Brownie recommended Plavix for stroke due to unknown source with recommendations for outpatient NUC in a few months if still in the [place].  ESBL  UTI was treated with  Bactrim.    Admit date: 05/25/2016 Discharge date: 06/16/2016  HPI Living with daughter  Who is an ICU nurse , 2nd fl apt, CBG 150-160s, daughter states blood pressures have been good, although elevated in the office this morning  Constipated at first  Difficult to get on and off toilet Appetite is good No breathing issues  Standing and placing pressure on RLE, no RUE movement  Pain Inventory Average Pain 4 Pain Right Now 4 My pain is aching  In the last 24 hours, has pain interfered with the following? General activity 5 Relation with others 5 Enjoyment of life 5 What TIME of day is your pain at its worst? night Sleep (in general) Good  Pain is worse with: some activites Pain improves with: . Relief from Meds: .  Mobility walk without assistance ability to climb steps?  no do you drive?  no  Function retired I need assistance  with the following:  dressing, bathing and toileting  Neuro/Psych weakness numbness trouble walking  Prior Studies Any changes since last visit?  no  Physicians involved in your care Any changes since last visit?  no   Family History  Problem Relation Age of Onset  . Stroke Sister        two sisters with recent strokes   Social History   Social History  . Marital status: Widowed    Spouse name: N/A  . Number of children: N/A  . Years of education: N/A   Social History Main Topics  . Smoking status: Never Smoker  . Smokeless tobacco: Never Used  . Alcohol use No  . Drug use: No  . Sexual activity: Not on file   Other Topics Concern  . Not on file   Social History Narrative  . No narrative on file   Past Surgical History:  Procedure Laterality Date  . CESAREAN SECTION    . TEE WITHOUT CARDIOVERSION N/A 05/23/2016   Procedure: TRANSESOPHAGEAL ECHOCARDIOGRAM (TEE);  Surgeon: Lars Masson, MD;  Location: [facility] ENDOSCOPY;  Service: Cardiovascular;  Laterality: N/A;   Past Medical History:  Diagnosis Date  . Diabetes mellitus without complication (HCC)   . Hypertension   . Neuropathy    There were no vitals taken for this visit.  Opioid Risk Score:   Fall Risk Score:  `1  Depression screen PHQ 2/9  No flowsheet data found.  Review of Systems  Constitutional: Negative.   HENT: Negative.   Eyes: Negative.  Respiratory: Negative.   Cardiovascular: Negative.   Gastrointestinal: Negative.   Endocrine: Negative.   Genitourinary: Negative.   Musculoskeletal: Negative.   Skin: Negative.   Allergic/Immunologic: Negative.   Neurological: Negative.   Hematological: Negative.   Psychiatric/Behavioral: Negative.   All other systems reviewed and are negative.      Objective:   Physical Exam  Constitutional: She is oriented to person, place, and time. She appears well-developed and well-nourished.  HENT:  Head: Normocephalic and atraumatic.  Eyes:  Conjunctivae and EOM are normal. Pupils are equal, round, and reactive to light.  Cardiovascular: Normal rate, regular rhythm and normal heart sounds.   Pulmonary/Chest: Effort normal and breath sounds normal. No respiratory distress.  Abdominal: Soft. Bowel sounds are normal. She exhibits no distension.  Neurological: She is alert and oriented to person, place, and time. She displays no atrophy. No sensory deficit. She exhibits abnormal muscle tone. Gait abnormal.  Psychiatric: She has a normal mood and affect.  Nursing note and vitals reviewed.  0/5 in the right upper extremity, 2 minus, right knee, hip extensors, otherwise 0 in the right lower extremity. Posture/5 in left upper and left lower limb      Assessment & Plan:  1. Right hemiparesis, secondary to left basal ganglia infarct. She has regained  little motor movement , although functionally she is able to stand with some assistance. Her daughter is providing 24-hour supervision for the patient. The patient will be returning to [place]. The next few weeks. I do think she should be able to fly his lungs, somebody is with her. Continue aspirin and Plavix, we'll follow up with neurology prior to returning to [place]  2. Hypertension. 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 4. Diabetes with neuropathy. Continue Amaryl 4 mg a day, continue Neurontin 300 mg twice a day  5. Hyperlipidemia continue Lipitor 40 mg per day  Physical medicine rehabilitation follow-up on as-needed basis

## 2016-06-30 NOTE — Patient Instructions (Signed)
Follow-up with neurology, follow-up with primary care, once returning to [place]

## 2016-07-27 ENCOUNTER — Ambulatory Visit: Payer: Self-pay | Admitting: Neurology

## 2016-07-28 ENCOUNTER — Encounter: Payer: Self-pay | Admitting: Neurology

## 2017-04-19 IMAGING — CT CT HEAD CODE STROKE
3 series · 15 of 47 positions shown, 18 images · non-contrast
Comparison: None.

CLINICAL DATA: Code stroke.  Right-sided weakness

EXAM:
CT HEAD WITHOUT CONTRAST
TECHNIQUE: Contiguous axial images were obtained from the base of the skull
through the vertex without intravenous contrast.

[Series 3: head 5.0 st · axial · 0.43mm/px · z∈[+518,+644]mm · 9 of 31 slices shown, 12 images]
[im 3/31  brain]
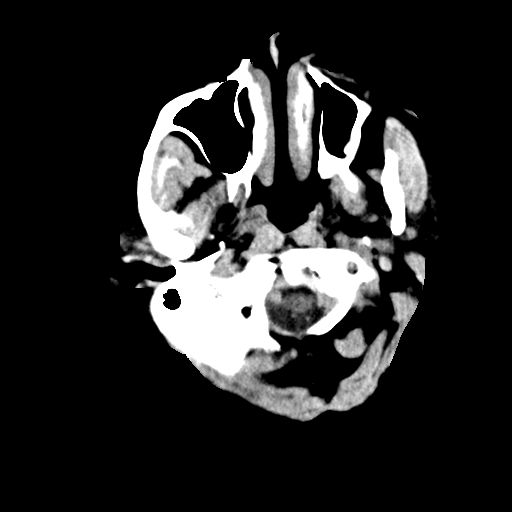
[im 3/31  bone]
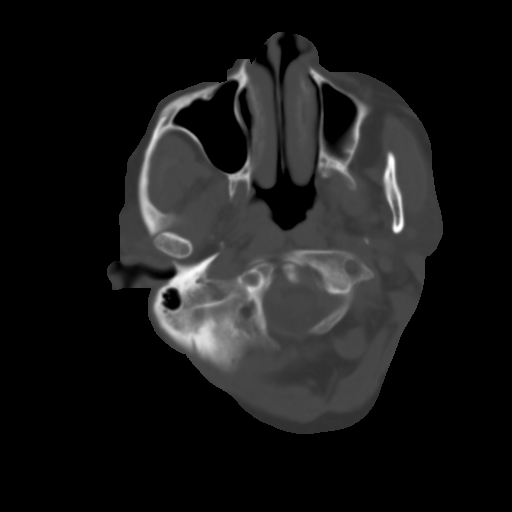
[im 6/31  brain]
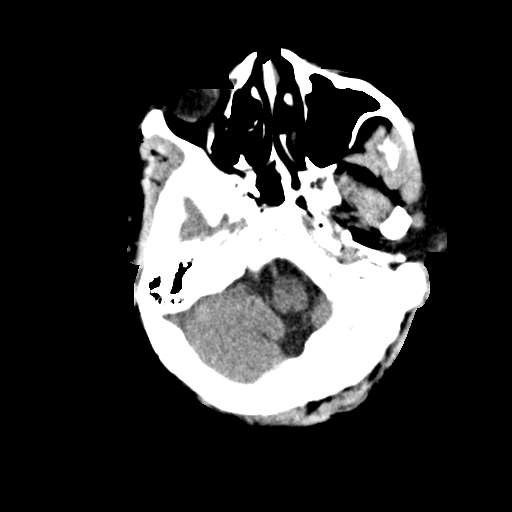
[im 9/31  brain]
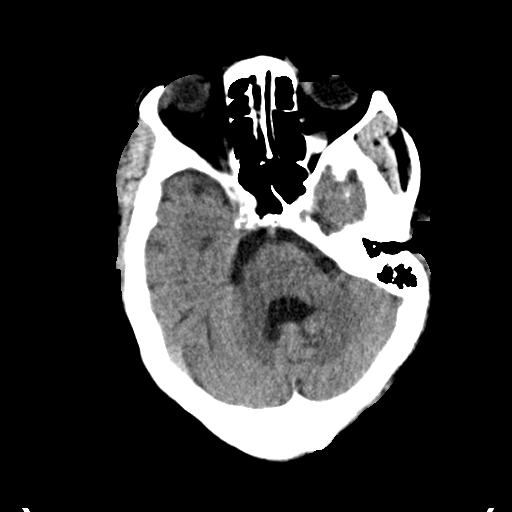
[im 12/31  brain]
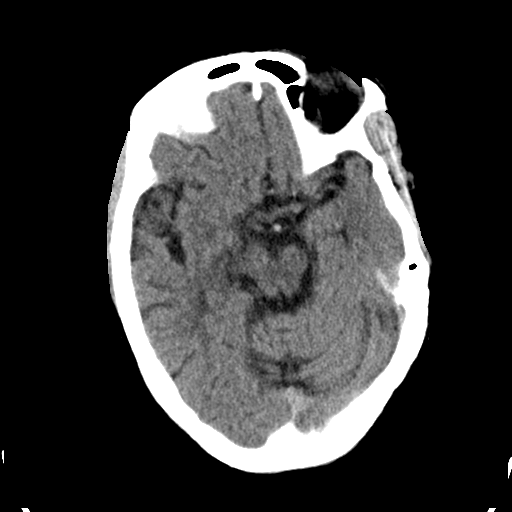
[im 16/31  brain]
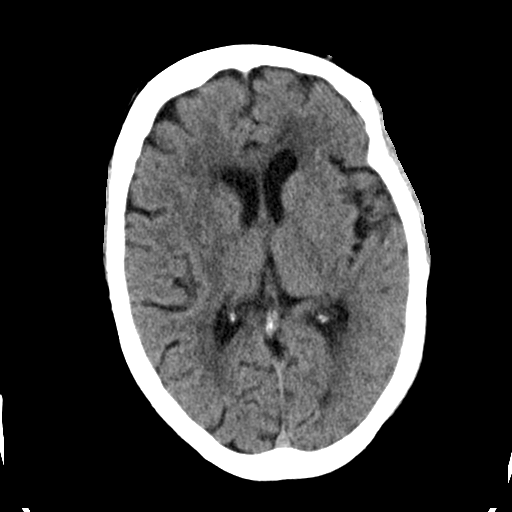
[im 16/31  bone]
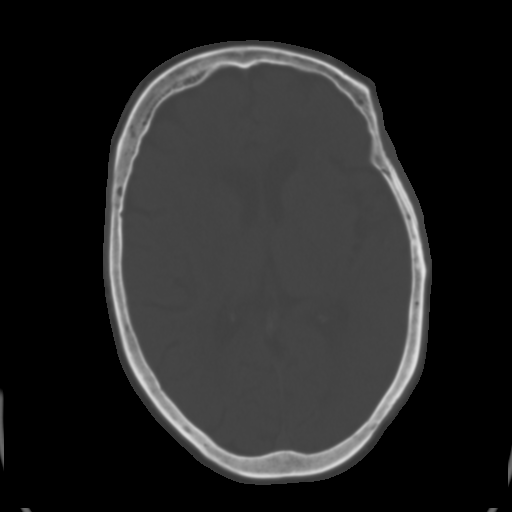
[im 19/31  brain]
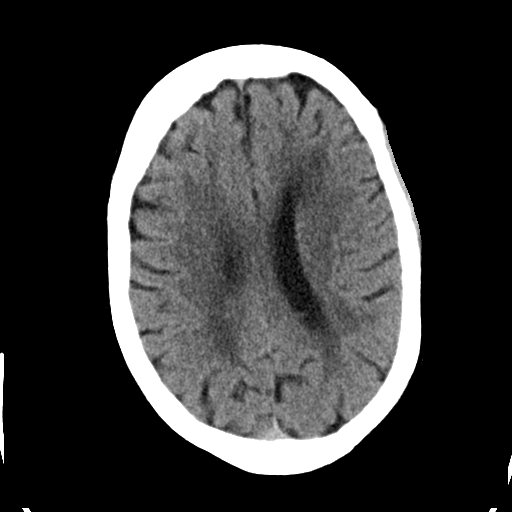
[im 22/31  brain]
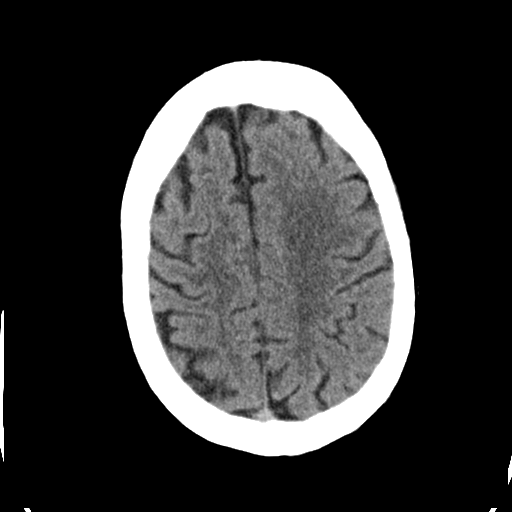
[im 25/31  brain]
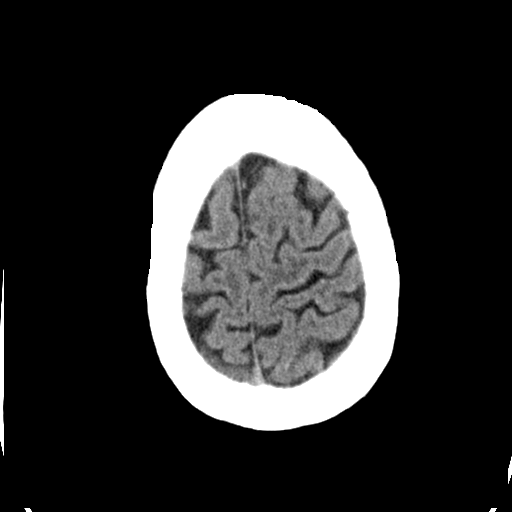
[im 28/31  brain]
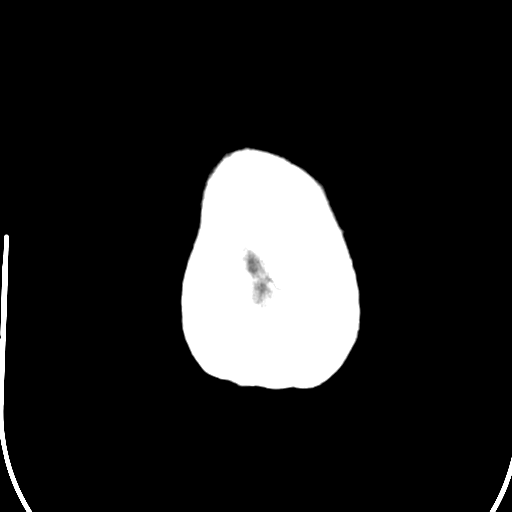
[im 28/31  bone]
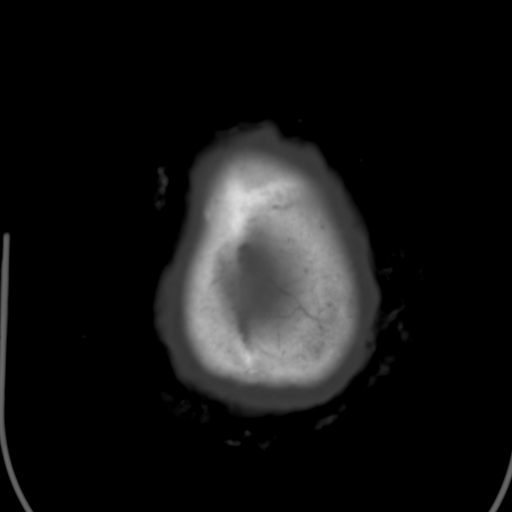

[Series 5: head 3.0 cor st · coronal · 0.30mm/px · 3 of 71 slices shown]
[im 24/71  brain]
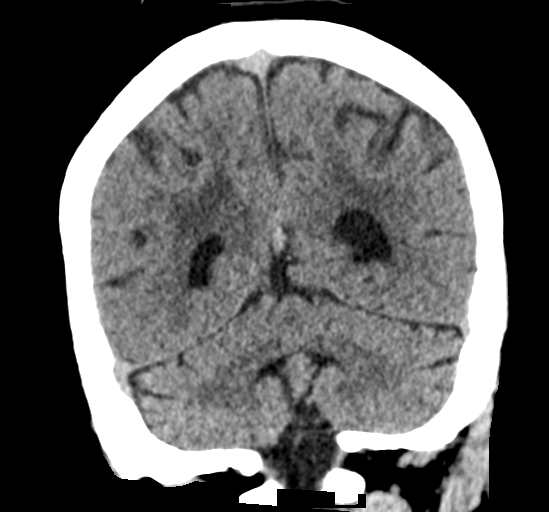
[im 32/71  brain]
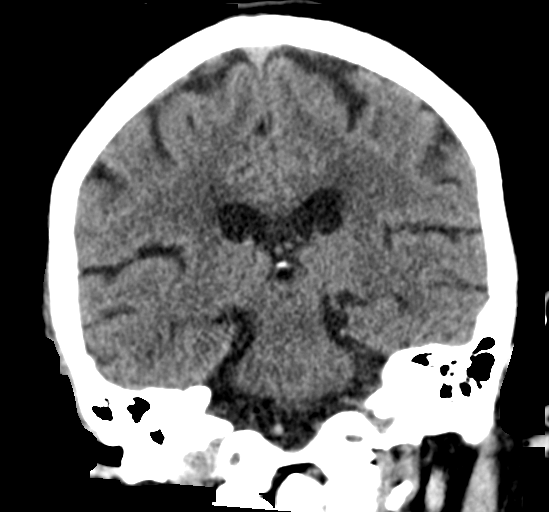
[im 39/71  brain]
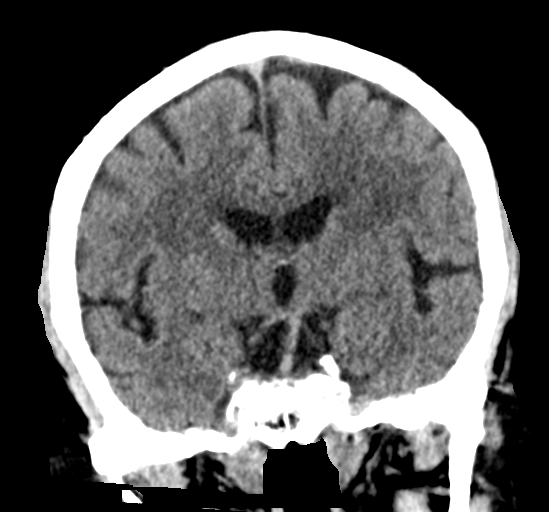

[Series 6: head 3.0 sag st · sagittal · 0.29mm/px · 3 of 48 slices shown]
[im 16/48  brain]
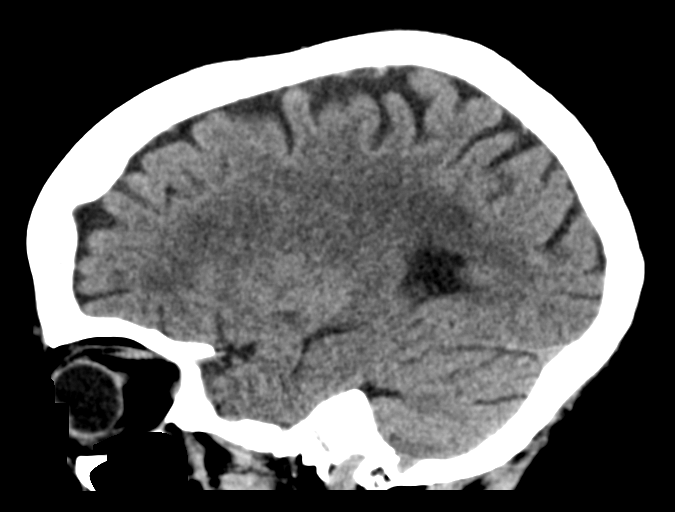
[im 24/48  brain]
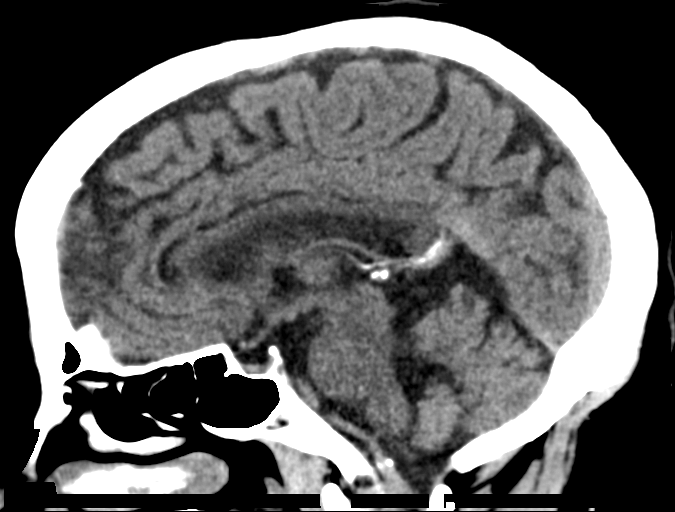
[im 32/48  brain]
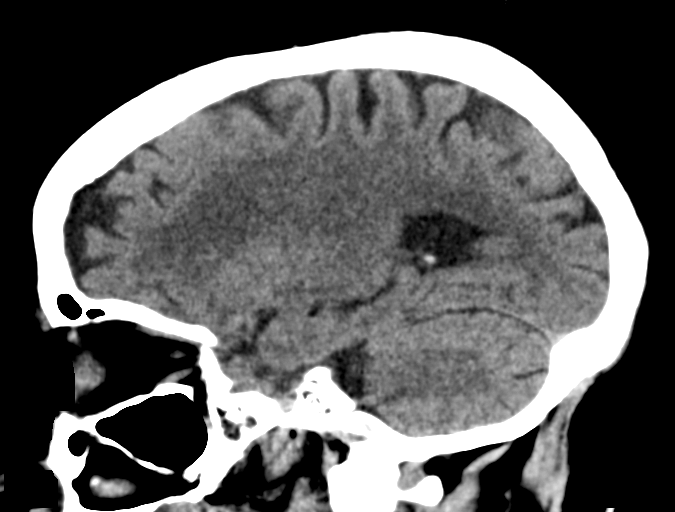

[15 of 47 positions shown; findings below may reference images not displayed]

FINDINGS: Brain: Generalized atrophy. Low-density throughout the cerebral
white matter bilaterally appears chronic.

Negative for acute infarct. Negative for hemorrhage or mass lesion.
No shift of the midline structures.

Vascular: Negative for hyperdense vessel

Skull: Negative

Sinuses/Orbits: Mild mucosal edema left maxillary sinus. Normal
orbit.

Other: None

ASPECTS (Alberta Stroke Program Early CT Score)

- Ganglionic level infarction (caudate, lentiform nuclei, internal
capsule, insula, M1-M3 cortex): 7

- Supraganglionic infarction (M4-M6 cortex): 3

Total score (0-10 with 10 being normal): 10
IMPRESSION: 1. Negative for acute infarct
2. ASPECTS is 10
3. Atrophy and moderate chronic microvascular ischemic change in the
white matter

I reviewed the images with Dr. Gz at 5050 hours 05/21/2016.

## 2017-04-19 IMAGING — CT CT ANGIO CHEST-ABD-PELV FOR DISSECTION W/ AND WO/W CM
2 of 7 series · 13 of 46 positions shown, 15 images · IV contrast (OMNI 350)
Comparison: 05/04/2016 CT abdomen/ pelvis.

CLINICAL DATA: Hypertension. Found down this morning with right
facial droop, aphasia and right sided weakness.

EXAM:
CT ANGIOGRAPHY CHEST, ABDOMEN AND PELVIS
TECHNIQUE: Multidetector CT imaging through the chest, abdomen and pelvis was
performed using the standard protocol during bolus administration of
intravenous contrast. Multiplanar reconstructed images and MIPs were
obtained and reviewed to evaluate the vascular anatomy.
CONTRAST:  75 cc Isovue 370 IV.

[Series 8: dissection 2mm · axial · 0.65mm/px · z∈[-348,+128]mm · 10 of 267 slices shown, 12 images]
[im 15/267  soft-tissue]
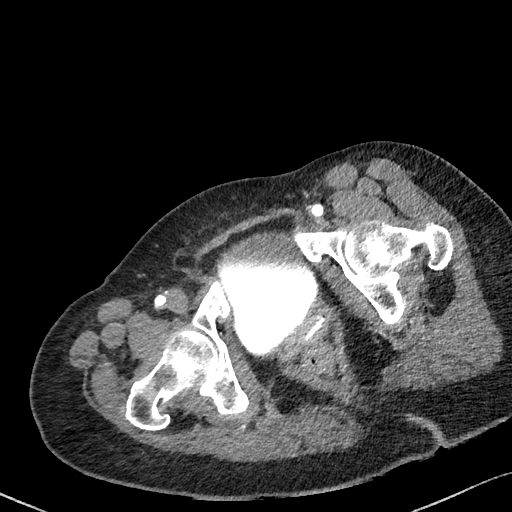
[im 15/267  bone]
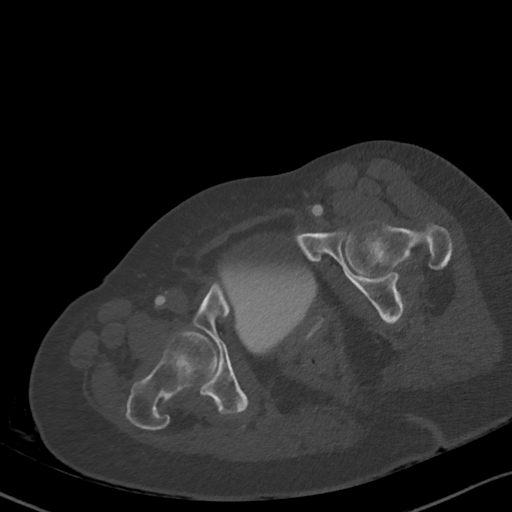
[im 43/267  soft-tissue]
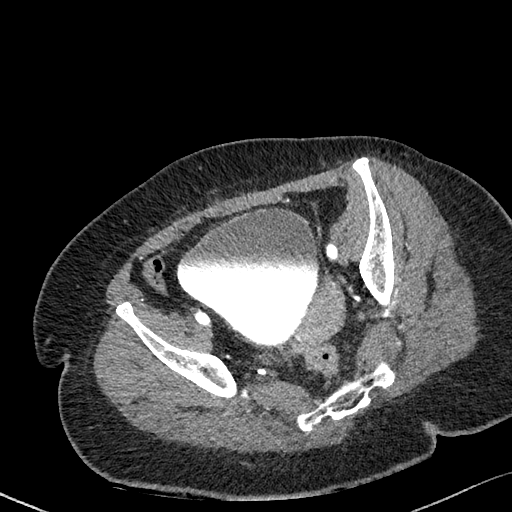
[im 71/267  soft-tissue]
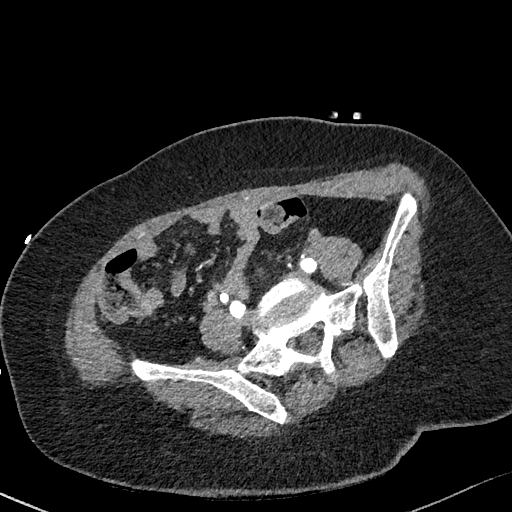
[im 99/267  soft-tissue]
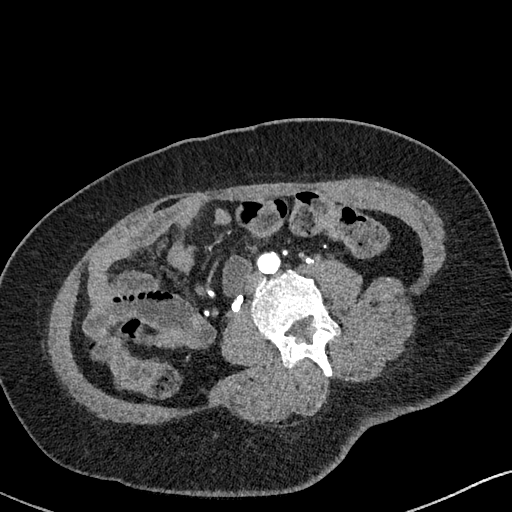
[im 127/267  soft-tissue]
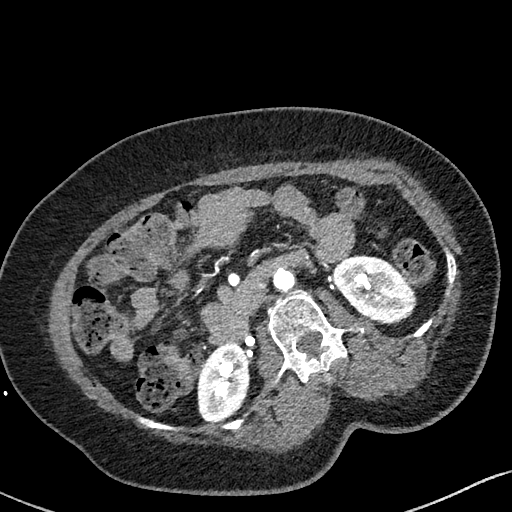
[im 141/267  soft-tissue]
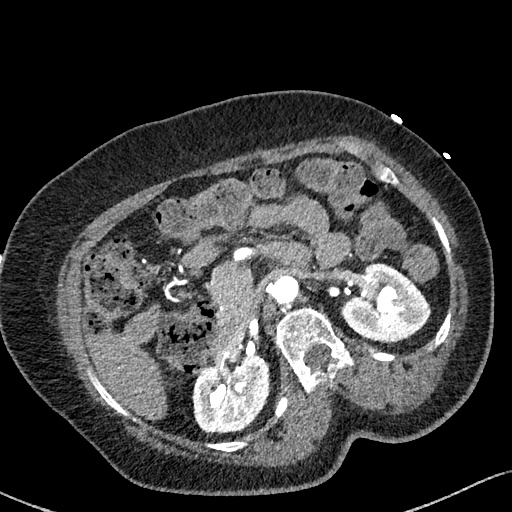
[im 169/267  soft-tissue]
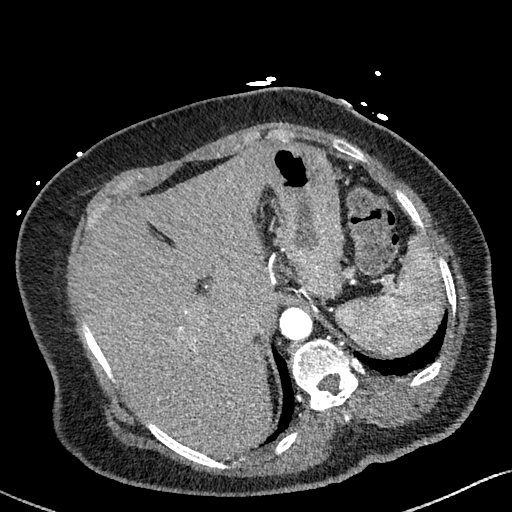
[im 197/267  soft-tissue]
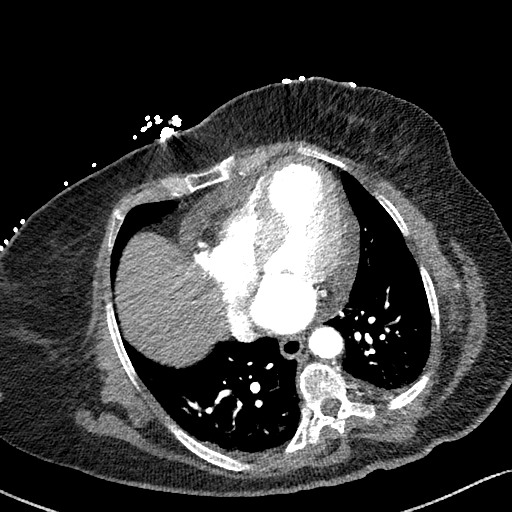
[im 225/267  soft-tissue]
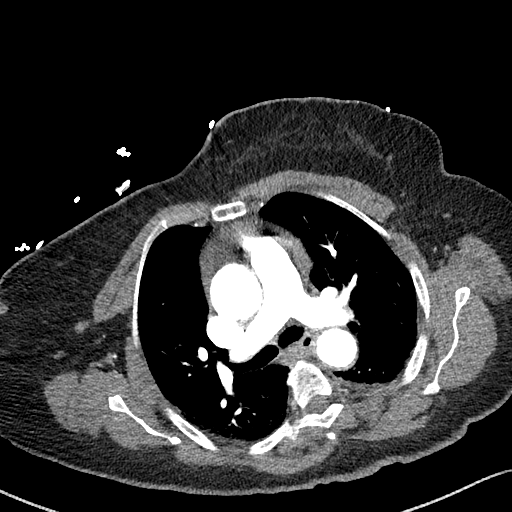
[im 225/267  bone]
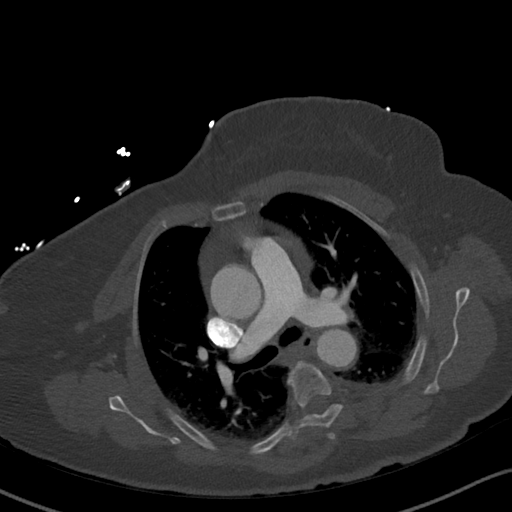
[im 253/267  soft-tissue]
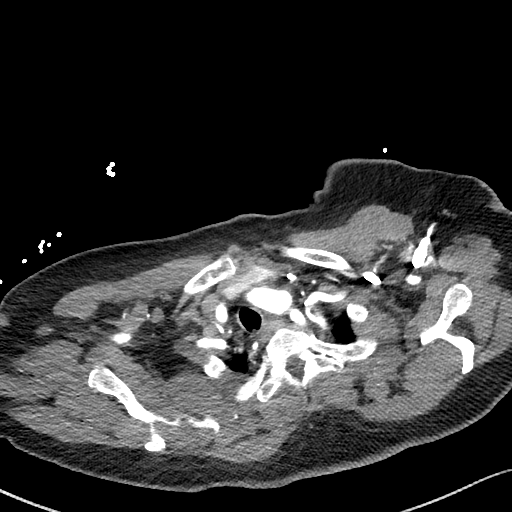

[Series 11: dissection 2mm cor · coronal · 0.76mm/px · 3 of 141 slices shown]
[im 36/141  soft-tissue]
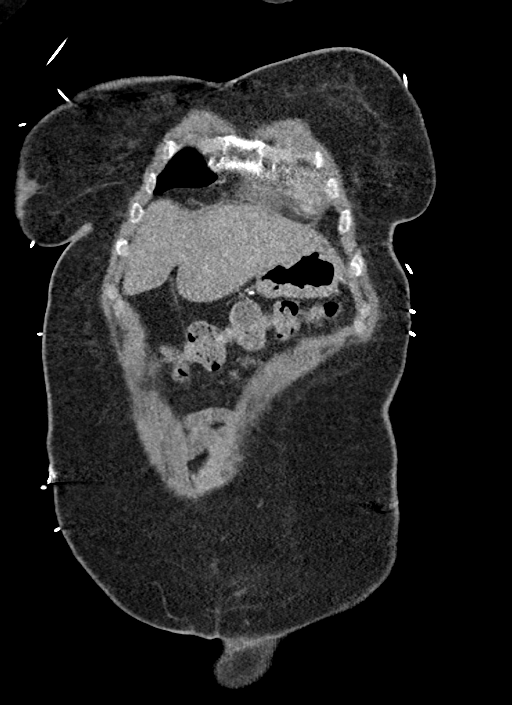
[im 71/141  soft-tissue]
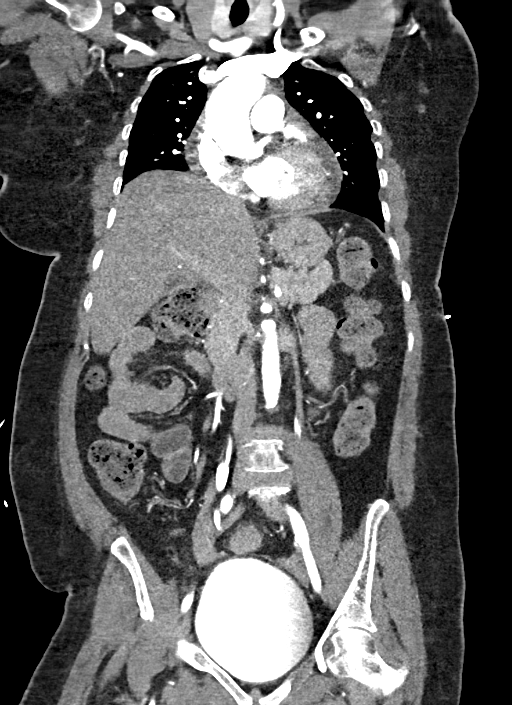
[im 106/141  soft-tissue]
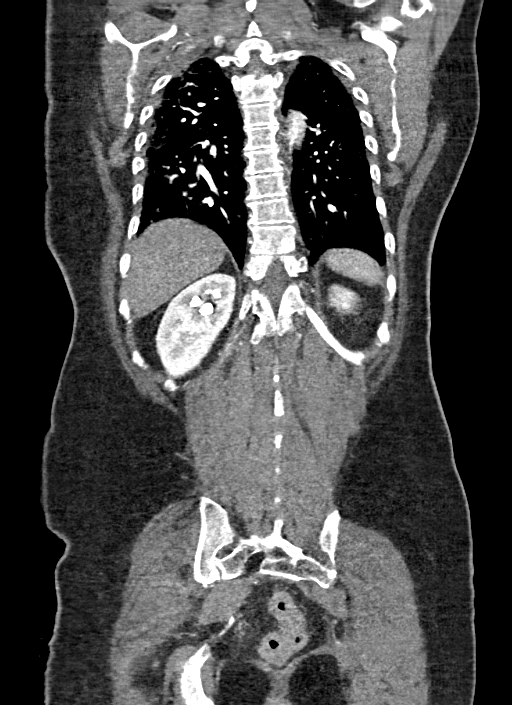

[13 of 46 positions shown; findings below may reference images not displayed]

FINDINGS: CTA CHEST FINDINGS

Cardiovascular: Top-normal heart size. Small to moderate pericardial
effusion/thickening, mildly increased. Left anterior descending,
left circumflex and right coronary atherosclerosis. Atherosclerotic
nonaneurysmal thoracic aorta. No thoracic aortic intramural
hematoma, dissection, pseudoaneurysm or penetrating atherosclerotic
ulcer. Common origin of the brachiocephalic and left common carotid
artery is from the aortic arch. Patent visualized proximal vertebral
arteries. Atherosclerotic aortic arch branch vessels with no
significant stenoses. Normal caliber pulmonary arteries. No central
pulmonary emboli.

Mediastinum/Nodes: Subcentimeter hypodense anterior right thyroid
lobe nodule. Unremarkable esophagus. No pathologically enlarged
axillary, mediastinal or hilar lymph nodes.

Lungs/Pleura: No pneumothorax. Trace dependent bilateral pleural
effusions. Hypoventilatory changes in the dependent lungs. No acute
consolidative airspace disease, lung masses or significant pulmonary
nodules in the aerated portions of the lungs.

Musculoskeletal: No aggressive appearing focal osseous lesions. Mild
thoracic spondylosis

Review of the MIP images confirms the above findings.

CTA ABDOMEN AND PELVIS FINDINGS

VASCULAR

Aorta: Atherosclerotic nonaneurysmal abdominal aorta. No evidence of
dissection, vasculitis or significant stenosis.

Celiac: Patent without evidence of aneurysm, dissection, vasculitis
or significant stenosis.

SMA: Patent without evidence of aneurysm, dissection, vasculitis or
significant stenosis.

Renals: Single renal arteries bilaterally. Approximately 50%
proximal right renal artery stenosis due to the atherosclerotic
plaque. No significant left renal artery stenosis.

IMA: Approximately 60% proximal IMA stenosis due to atherosclerotic
plaque.

Inflow: Patent without evidence of aneurysm, dissection, vasculitis
or significant stenosis.

Veins: No obvious venous abnormality within the limitations of this
arterial phase study.

Review of the MIP images confirms the above findings.

NON-VASCULAR

Hepatobiliary: Normal liver with no liver mass. Normal gallbladder
with no radiopaque cholelithiasis. No biliary ductal dilatation.

Pancreas: Normal, with no mass or duct dilation.

Spleen: Normal size. No mass.

Adrenals/Urinary Tract: Normal adrenals. Symmetric contrast
nephrograms. No hydronephrosis. No renal mass. Duplication of the
renal collecting systems bilaterally at least to the level of the
pelvic ureter on the left and to the level of the lower lumbar
ureter on the right. Normal bladder.

Stomach/Bowel: Small hiatal hernia. Otherwise collapsed and grossly
normal stomach. Normal caliber small bowel with no small bowel wall
thickening. Normal appendix. Normal large bowel with no
diverticulosis, large bowel wall thickening or pericolonic fat
stranding.

Vascular/Lymphatic: Atherosclerotic nonaneurysmal abdominal aorta.
No pathologically enlarged lymph nodes in the abdomen or pelvis.

Reproductive: Grossly normal uterus. Contrast in the vagina is
probably due to retrograde filling from urinary incontinence. No
adnexal mass.

Other: No pneumoperitoneum, ascites or focal fluid collection.

Musculoskeletal: No aggressive appearing focal osseous lesions.

Review of the MIP images confirms the above findings.
IMPRESSION: 1. No acute aortic syndrome.
2. Small to moderate pericardial effusion, mildly increased since
05/04/2016. Consider echocardiographic correlation.
3. Trace dependent bilateral pleural effusions. No acute pulmonary
disease.
4. No acute abnormality in the abdomen or pelvis.
5. Three-vessel coronary atherosclerosis.
6. Moderate stenoses of the proximal right renal artery and IMA due
to atherosclerotic plaque.
7. Small hiatal hernia.
